# Patient Record
Sex: Female | Born: 1955
Health system: Southern US, Community
[De-identification: ages and names within clinical notes are randomized; demographics above are authoritative.]

## PROBLEM LIST (undated history)

## (undated) DIAGNOSIS — Z9289 Personal history of other medical treatment: Secondary | ICD-10-CM

## (undated) DIAGNOSIS — Z8489 Family history of other specified conditions: Secondary | ICD-10-CM

## (undated) DIAGNOSIS — F32A Depression, unspecified: Secondary | ICD-10-CM

## (undated) DIAGNOSIS — F329 Major depressive disorder, single episode, unspecified: Secondary | ICD-10-CM

## (undated) DIAGNOSIS — R32 Unspecified urinary incontinence: Secondary | ICD-10-CM

## (undated) DIAGNOSIS — R091 Pleurisy: Secondary | ICD-10-CM

## (undated) DIAGNOSIS — C50919 Malignant neoplasm of unspecified site of unspecified female breast: Secondary | ICD-10-CM

## (undated) DIAGNOSIS — R112 Nausea with vomiting, unspecified: Secondary | ICD-10-CM

## (undated) DIAGNOSIS — I10 Essential (primary) hypertension: Secondary | ICD-10-CM

## (undated) DIAGNOSIS — F988 Other specified behavioral and emotional disorders with onset usually occurring in childhood and adolescence: Secondary | ICD-10-CM

## (undated) DIAGNOSIS — K219 Gastro-esophageal reflux disease without esophagitis: Secondary | ICD-10-CM

## (undated) DIAGNOSIS — T4145XA Adverse effect of unspecified anesthetic, initial encounter: Secondary | ICD-10-CM

## (undated) DIAGNOSIS — M1990 Unspecified osteoarthritis, unspecified site: Secondary | ICD-10-CM

## (undated) DIAGNOSIS — J189 Pneumonia, unspecified organism: Secondary | ICD-10-CM

## (undated) DIAGNOSIS — Z8711 Personal history of peptic ulcer disease: Secondary | ICD-10-CM

## (undated) DIAGNOSIS — G43909 Migraine, unspecified, not intractable, without status migrainosus: Secondary | ICD-10-CM

## (undated) DIAGNOSIS — K5900 Constipation, unspecified: Secondary | ICD-10-CM

## (undated) DIAGNOSIS — T8859XA Other complications of anesthesia, initial encounter: Secondary | ICD-10-CM

## (undated) DIAGNOSIS — K5792 Diverticulitis of intestine, part unspecified, without perforation or abscess without bleeding: Secondary | ICD-10-CM

## (undated) DIAGNOSIS — Z9889 Other specified postprocedural states: Secondary | ICD-10-CM

## (undated) DIAGNOSIS — D649 Anemia, unspecified: Secondary | ICD-10-CM

## (undated) DIAGNOSIS — M797 Fibromyalgia: Secondary | ICD-10-CM

## (undated) DIAGNOSIS — Z8719 Personal history of other diseases of the digestive system: Secondary | ICD-10-CM

## (undated) HISTORY — DX: Gastro-esophageal reflux disease without esophagitis: K21.9

## (undated) HISTORY — DX: Personal history of peptic ulcer disease: Z87.11

## (undated) HISTORY — DX: Unspecified urinary incontinence: R32

## (undated) HISTORY — DX: Migraine, unspecified, not intractable, without status migrainosus: G43.909

## (undated) HISTORY — PX: BREAST RECONSTRUCTION: SHX9

## (undated) HISTORY — PX: COLONOSCOPY: SHX174

## (undated) HISTORY — DX: Personal history of other diseases of the digestive system: Z87.19

## (undated) HISTORY — DX: Personal history of other medical treatment: Z92.89

## (undated) HISTORY — DX: Malignant neoplasm of unspecified site of unspecified female breast: C50.919

---
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
## 1983-01-10 HISTORY — PX: MASTECTOMY: SHX3

## 1983-01-10 HISTORY — PX: BREAST BIOPSY: SHX20

## 1985-01-09 HISTORY — PX: CHOLECYSTECTOMY: SHX55

## 1998-02-10 ENCOUNTER — Emergency Department (HOSPITAL_COMMUNITY): Admission: EM | Admit: 1998-02-10 | Discharge: 1998-02-10 | Payer: Self-pay | Admitting: Emergency Medicine

## 1998-02-10 ENCOUNTER — Encounter: Payer: Self-pay | Admitting: Emergency Medicine

## 1998-10-29 ENCOUNTER — Other Ambulatory Visit: Admission: RE | Admit: 1998-10-29 | Discharge: 1998-10-29 | Payer: Self-pay | Admitting: Family Medicine

## 1999-04-12 ENCOUNTER — Ambulatory Visit (HOSPITAL_BASED_OUTPATIENT_CLINIC_OR_DEPARTMENT_OTHER): Admission: RE | Admit: 1999-04-12 | Discharge: 1999-04-12 | Payer: Self-pay | Admitting: General Surgery

## 1999-04-12 ENCOUNTER — Encounter (INDEPENDENT_AMBULATORY_CARE_PROVIDER_SITE_OTHER): Payer: Self-pay | Admitting: *Deleted

## 1999-04-12 ENCOUNTER — Emergency Department (HOSPITAL_COMMUNITY): Admission: EM | Admit: 1999-04-12 | Discharge: 1999-04-12 | Payer: Self-pay | Admitting: Emergency Medicine

## 1999-08-14 ENCOUNTER — Encounter: Payer: Self-pay | Admitting: Emergency Medicine

## 1999-08-14 ENCOUNTER — Emergency Department (HOSPITAL_COMMUNITY): Admission: EM | Admit: 1999-08-14 | Discharge: 1999-08-14 | Payer: Self-pay | Admitting: Emergency Medicine

## 2001-05-27 ENCOUNTER — Ambulatory Visit (HOSPITAL_BASED_OUTPATIENT_CLINIC_OR_DEPARTMENT_OTHER): Admission: RE | Admit: 2001-05-27 | Discharge: 2001-05-27 | Payer: Self-pay | Admitting: Cardiology

## 2009-11-07 ENCOUNTER — Ambulatory Visit: Payer: Self-pay | Admitting: Diagnostic Radiology

## 2009-11-07 ENCOUNTER — Emergency Department (HOSPITAL_BASED_OUTPATIENT_CLINIC_OR_DEPARTMENT_OTHER): Admission: EM | Admit: 2009-11-07 | Discharge: 2009-11-08 | Payer: Self-pay | Admitting: Emergency Medicine

## 2010-01-09 HISTORY — PX: LASIK: SHX215

## 2010-01-09 LAB — HM COLONOSCOPY

## 2010-01-09 LAB — HM MAMMOGRAPHY

## 2010-03-23 LAB — CBC
HCT: 38.7 % (ref 36.0–46.0)
Hemoglobin: 13 g/dL (ref 12.0–15.0)
MCH: 28.2 pg (ref 26.0–34.0)
MCHC: 33.7 g/dL (ref 30.0–36.0)
MCV: 83.6 fL (ref 78.0–100.0)
Platelets: 248 10*3/uL (ref 150–400)
RBC: 4.63 MIL/uL (ref 3.87–5.11)
RDW: 15 % (ref 11.5–15.5)
WBC: 5.3 10*3/uL (ref 4.0–10.5)

## 2010-03-23 LAB — COMPREHENSIVE METABOLIC PANEL
ALT: 34 U/L (ref 0–35)
AST: 26 U/L (ref 0–37)
Albumin: 4.2 g/dL (ref 3.5–5.2)
Alkaline Phosphatase: 102 U/L (ref 39–117)
BUN: 20 mg/dL (ref 6–23)
CO2: 27 mEq/L (ref 19–32)
Calcium: 9 mg/dL (ref 8.4–10.5)
Chloride: 106 mEq/L (ref 96–112)
Creatinine, Ser: 0.9 mg/dL (ref 0.4–1.2)
GFR calc Af Amer: >60 mL/min (ref >60)
GFR calc non Af Amer: >60 mL/min (ref >60)
Glucose, Bld: 118 mg/dL — ABNORMAL HIGH (ref 70–99)
Potassium: 3.4 mEq/L — ABNORMAL LOW (ref 3.5–5.1)
Sodium: 145 mEq/L (ref 135–145)
Total Bilirubin: 0.4 mg/dL (ref 0.3–1.2)
Total Protein: 7.3 g/dL (ref 6.0–8.3)

## 2010-03-23 LAB — DIFFERENTIAL
Basophils Absolute: 0 10*3/uL (ref 0.0–0.1)
Basophils Relative: 1 % (ref 0–1)
Eosinophils Absolute: 0.1 10*3/uL (ref 0.0–0.7)
Eosinophils Relative: 2 % (ref 0–5)
Lymphocytes Relative: 38 % (ref 12–46)
Lymphs Abs: 2 10*3/uL (ref 0.7–4.0)
Monocytes Absolute: 0.4 10*3/uL (ref 0.1–1.0)
Monocytes Relative: 8 % (ref 3–12)
Neutro Abs: 2.8 10*3/uL (ref 1.7–7.7)
Neutrophils Relative %: 51 % (ref 43–77)

## 2010-03-23 LAB — D-DIMER, QUANTITATIVE: D-Dimer, Quant: 0.42 ug/mL-FEU (ref 0.00–0.48)

## 2010-03-23 LAB — POCT CARDIAC MARKERS
CKMB, poc: 1.1 ng/mL (ref 1.0–8.0)
Myoglobin, poc: 72.5 ng/mL (ref 12–200)
Troponin i, poc: <0.05 ng/mL (ref 0.00–0.09)

## 2011-11-08 ENCOUNTER — Encounter (HOSPITAL_COMMUNITY): Payer: Self-pay | Admitting: Emergency Medicine

## 2011-11-08 ENCOUNTER — Emergency Department (HOSPITAL_COMMUNITY): Payer: BC Managed Care – PPO

## 2011-11-08 ENCOUNTER — Observation Stay (HOSPITAL_COMMUNITY)
Admission: EM | Admit: 2011-11-08 | Discharge: 2011-11-09 | Disposition: A | Discharge status: Home / Self Care | Payer: BC Managed Care – PPO | Attending: Emergency Medicine | Admitting: Emergency Medicine

## 2011-11-08 DIAGNOSIS — R079 Chest pain, unspecified: Principal | ICD-10-CM | POA: Insufficient documentation

## 2011-11-08 HISTORY — DX: Major depressive disorder, single episode, unspecified: F32.9

## 2011-11-08 HISTORY — DX: Pleurisy: R09.1

## 2011-11-08 HISTORY — DX: Essential (primary) hypertension: I10

## 2011-11-08 HISTORY — DX: Depression, unspecified: F32.A

## 2011-11-08 LAB — CBC WITH DIFFERENTIAL/PLATELET
Basophils Relative: 1 % (ref 0–1)
Eosinophils Absolute: 0.1 10*3/uL (ref 0.0–0.7)
HCT: 39.3 % (ref 36.0–46.0)
Hemoglobin: 13.1 g/dL (ref 12.0–15.0)
MCH: 27.9 pg (ref 26.0–34.0)
MCHC: 33.3 g/dL (ref 30.0–36.0)
Monocytes Absolute: 0.5 10*3/uL (ref 0.1–1.0)
Monocytes Relative: 8 % (ref 3–12)

## 2011-11-08 LAB — COMPREHENSIVE METABOLIC PANEL
Albumin: 3.7 g/dL (ref 3.5–5.2)
BUN: 17 mg/dL (ref 6–23)
Chloride: 103 mEq/L (ref 96–112)
Creatinine, Ser: 0.77 mg/dL (ref 0.50–1.10)
GFR calc Af Amer: >90 mL/min (ref >90)
Glucose, Bld: 107 mg/dL — ABNORMAL HIGH (ref 70–99)
Total Bilirubin: 0.2 mg/dL — ABNORMAL LOW (ref 0.3–1.2)
Total Protein: 7.1 g/dL (ref 6.0–8.3)

## 2011-11-08 LAB — TROPONIN I
Troponin I: <0.3 ng/mL (ref <0.30)
Troponin I: <0.3 ng/mL (ref <0.30)

## 2011-11-08 LAB — LIPASE, BLOOD: Lipase: 25 U/L (ref 11–59)

## 2011-11-08 MED ORDER — MORPHINE SULFATE 4 MG/ML IJ SOLN
4.0000 mg | Freq: Once | INTRAMUSCULAR | Status: AC
  Administered 2011-11-08: 4 mg via INTRAVENOUS
  Filled 2011-11-08: qty 1

## 2011-11-08 MED ORDER — PROMETHAZINE HCL 25 MG/ML IJ SOLN
25.0000 mg | Freq: Once | INTRAMUSCULAR | Status: AC
  Administered 2011-11-08: 25 mg via INTRAVENOUS
  Filled 2011-11-08: qty 1

## 2011-11-08 MED ORDER — SODIUM CHLORIDE 0.9 % IV BOLUS (SEPSIS)
1000.0000 mL | Freq: Once | INTRAVENOUS | Status: AC
  Administered 2011-11-08: 1000 mL via INTRAVENOUS

## 2011-11-08 NOTE — ED Notes (Signed)
Per EMS: pt has acute pressure in chest 10/10 pain radiating to neck. Pt was pale and daiphoretic on arrival. Pt states she felt like she was going to pass out. EMS states 12 lead was unremarkable en route with few PVCS. BP en route 150/106. Nitro was not given due to possible pt allergy. 6 mg morphine given en route. 2 L [place]. Pt complains of mild headache.

## 2011-11-08 NOTE — ED Notes (Signed)
Pt returned from X-ray.  

## 2011-11-08 NOTE — ED Notes (Signed)
Pt removed from oxygen to see if pt can maintain oxygen saturation without O2

## 2011-11-08 NOTE — ED Provider Notes (Signed)
History     CSN: [id number]  Arrival date & time 11/08/11  1643   First MD Initiated Contact with Patient 11/08/11 1645      Chief Complaint  Patient presents with  . Chest Pain    (Consider location/radiation/quality/duration/timing/severity/associated sxs/prior treatment) HPI Comments: Patient presents with sudden onset of severe chest pain that is substernal and tight/viselike in nature. When EMS arrived they noted that she was pale and clammy. She refused nitroglycerin because her reaction is a severe headache. She responded to 6 mg morphine which brought her pain down from a 10 out of 10 to a 6/10.  Patient is a 56 y.o. female presenting with chest pain. The history is provided by the patient. No language interpreter was used.  Chest Pain The chest pain began 3 - 5 hours ago. Duration of episode(s) is 1 hour. Chest pain occurs rarely. The chest pain is improving. At its most intense, the pain is at 10/10. The pain is currently at 6/10. The severity of the pain is severe. The quality of the pain is described as squeezing, pressure-like and vice-like. The pain radiates to the left jaw. Primary symptoms include shortness of breath and nausea. Pertinent negatives for primary symptoms include no fever, no fatigue, no syncope, no cough, no wheezing, no abdominal pain and no vomiting.  Associated symptoms include diaphoresis and numbness (tingling bilat hands).  Pertinent negatives for associated symptoms include no claudication, no lower extremity edema, no near-syncope and no weakness. She tried nothing for the symptoms. Risk factors include obesity.  Her past medical history is significant for cancer (L breast, s/p mastectomy, remission).  Pertinent negatives for past medical history include no CAD, no MI and no PE.  Her family medical history is significant for CAD in family and early MI in family.     No past medical history on file.  No past surgical history on file.  No family  history on file.  History  Substance Use Topics  . Smoking status: Not on file  . Smokeless tobacco: Not on file  . Alcohol Use: Not on file    OB History    No data available      Review of Systems  Constitutional: Positive for diaphoresis. Negative for fever, chills, activity change, appetite change and fatigue.  HENT: Negative for congestion, rhinorrhea, neck pain, neck stiffness and sinus pressure.   Eyes: Negative for discharge and visual disturbance.  Respiratory: Positive for shortness of breath. Negative for cough, chest tightness, wheezing and stridor.   Cardiovascular: Positive for chest pain. Negative for claudication, leg swelling, syncope and near-syncope.  Gastrointestinal: Positive for nausea. Negative for vomiting, abdominal pain, diarrhea and abdominal distention.  Genitourinary: Negative for decreased urine volume and difficulty urinating.  Musculoskeletal: Negative for back pain and arthralgias.  Skin: Positive for pallor. Negative for color change.  Neurological: Positive for numbness (tingling bilat hands). Negative for weakness, light-headedness and headaches.  Psychiatric/Behavioral: Negative for behavioral problems and agitation.  All other systems reviewed and are negative.    Allergies  Nitroglycerin  Home Medications  No current outpatient prescriptions on file.  BP 167/97  Pulse 79  Temp 97.9 F (36.6 C) (Oral)  Resp 19  SpO2 100%  Physical Exam  Nursing note and vitals reviewed. Constitutional: She is oriented to person, place, and time. She appears well-developed and well-nourished. No distress.  HENT:  Head: Normocephalic and atraumatic.  Mouth/Throat: No oropharyngeal exudate.  Eyes: EOM are normal. Pupils are equal, round, and  reactive to light. Right eye exhibits no discharge. Left eye exhibits no discharge.  Neck: Normal range of motion. Neck supple. No JVD present.  Cardiovascular: Normal rate, regular rhythm and normal heart  sounds.  Exam reveals no gallop and no friction rub.   No murmur heard. Pulmonary/Chest: Effort normal and breath sounds normal. No stridor. No respiratory distress. She exhibits no tenderness.  Abdominal: Soft. Bowel sounds are normal. She exhibits no distension. There is tenderness (mild upper abd, states it feels "sore"). There is no guarding.  Musculoskeletal: Normal range of motion. She exhibits no edema and no tenderness.  Neurological: She is alert and oriented to person, place, and time. No cranial nerve deficit. She exhibits normal muscle tone.  Skin: Skin is warm and dry. No rash noted. She is not diaphoretic.  Psychiatric: She has a normal mood and affect. Her behavior is normal. Judgment and thought content normal.    ED Course  Procedures (including critical care time)  Labs Reviewed  COMPREHENSIVE METABOLIC PANEL - Abnormal; Notable for the following:    Glucose, Bld 107 (*)     Total Bilirubin 0.2 (*)     All other components within normal limits  CBC WITH DIFFERENTIAL  LIPASE, BLOOD  TROPONIN I  TROPONIN I  TROPONIN I   Dg Chest 2 View  11/08/2011  *RADIOLOGY REPORT*  Clinical Data: Chest pain.  Short of breath.  CHEST - 2 VIEW  Comparison: 11/07/2009  Findings: Artifact overlies chest.  Heart size is normal. Mediastinal shadows are normal.  The lungs are clear.  No effusions.  No significant bony finding.  IMPRESSION: No active disease   Original Report Authenticated By: Thomasenia Sales, M.D.      1. Chest pain at rest     EKG interpreted by me. NSR, no acute ST or TW changes indicating acute ischemia or infarction. No acute arrhythmias.  MDM    5:12 PM will give 4 mg of morphine. Normal EKG. History concerning for ACS. I considered but doubt pulmonary embolism, aortic dissection, pneumonia, pneumothorax as the cause of the patient's chest pain.  First troponin was negative. Chest pain resolved. Will keep her in our observation unit for a low risk chest pain  rule out. Will cycle troponins and further evaluate with coronary CT in the morning.        Warrick Parisian, MD 11/09/11 0111

## 2011-11-08 NOTE — ED Provider Notes (Signed)
56 year old female moved to CDU and chest pain protocol. Currently she is not having any chest pain. States she is beginning to get a "migraine"which is causing her to become nauseated. She has not eaten anything all day and is hungry. Denies shortness of breath. On exam patient is resting comfortably in bed in no apparent distress. Eyes PERRLA, EOMI and conjunctiva normal. Heart RRR. Lungs CTA. Lower extremities without edema. Distal pulses intact. AAOx3. Initial troponin and labs negative. Awaiting CTA in the morning.  11:55 PM Case discussed with Dr. Hyacinth Meeker who will take over care of patient at this time.  Trevor Mace, PA-C 11/08/11 2355

## 2011-11-08 NOTE — ED Notes (Signed)
Family at bedside. 

## 2011-11-08 NOTE — ED Notes (Signed)
Report given to Damon, RN in CDU. Pt trasnfered to CDU.

## 2011-11-08 NOTE — ED Notes (Signed)
Pt denies nausea, dizziness. Pt states possible lightheadedness from medicine. Pt states pressure in head.

## 2011-11-08 NOTE — ED Notes (Signed)
Ambrose Mantle, MD at bedside.

## 2011-11-08 NOTE — ED Notes (Signed)
Pt states chest pain started and was 10/10 pain. Pt states she became short of breath and hands felt like "pins and needles"

## 2011-11-09 ENCOUNTER — Observation Stay (HOSPITAL_COMMUNITY): Payer: BC Managed Care – PPO

## 2011-11-09 MED ORDER — ACETAMINOPHEN 325 MG PO TABS: 650.0000 mg | ORAL_TABLET | Freq: Once | ORAL | Status: AC

## 2011-11-09 MED ORDER — METOPROLOL TARTRATE 25 MG PO TABS
100.0000 mg | ORAL_TABLET | Freq: Once | ORAL | Status: AC
  Administered 2011-11-09: 100 mg via ORAL
  Filled 2011-11-09: qty 4

## 2011-11-09 MED ORDER — MORPHINE SULFATE 4 MG/ML IJ SOLN
4.0000 mg | Freq: Once | INTRAMUSCULAR | Status: AC
  Administered 2011-11-09: 4 mg via INTRAVENOUS
  Filled 2011-11-09: qty 1

## 2011-11-09 MED ORDER — METOPROLOL TARTRATE 1 MG/ML IV SOLN
INTRAVENOUS | Status: AC
  Administered 2011-11-09: 5 mg
  Filled 2011-11-09: qty 15

## 2011-11-09 MED ORDER — IOHEXOL 350 MG/ML SOLN
80.0000 mL | Freq: Once | INTRAVENOUS | Status: AC | PRN
  Administered 2011-11-09: 80 mL via INTRAVENOUS

## 2011-11-09 NOTE — ED Provider Notes (Signed)
Medical screening examination/treatment/procedure(s) were performed by non-physician practitioner and as supervising physician I was immediately available for consultation/collaboration.   Lyanne Co, MD 11/09/11 [phone]

## 2011-11-09 NOTE — Progress Notes (Signed)
Utilization review completed.  

## 2011-11-09 NOTE — ED Provider Notes (Signed)
I saw and evaluated the patient, reviewed the resident's note and I agree with the findings and plan.   Date: 11/09/2011  Rate: 76  Rhythm: normal sinus rhythm  QRS Axis: normal  Intervals: normal  ST/T Wave abnormalities: normal  Conduction Disutrbances: none  Narrative Interpretation:   Old EKG Reviewed: No significant changes noted  Lower is chest pain.  Up with the patient is a good candidate for our CT chest pain protocol.  The patient will receive serial cardiac enzymes through the night and likely cardiac CT in the morning.  1. Chest pain at rest    Dg Chest 2 View  11/08/2011  *RADIOLOGY REPORT*  Clinical Data: Chest pain.  Short of breath.  CHEST - 2 VIEW  Comparison: 11/07/2009  Findings: Artifact overlies chest.  Heart size is normal. Mediastinal shadows are normal.  The lungs are clear.  No effusions.  No significant bony finding.  IMPRESSION: No active disease   Original Report Authenticated By: Thomasenia Sales, M.D.   Results for orders placed during the hospital encounter of 11/08/11  CBC WITH DIFFERENTIAL      Component Value Range   WBC 6.8  4.0 - 10.5 K/uL   RBC 4.69  3.87 - 5.11 MIL/uL   Hemoglobin 13.1  12.0 - 15.0 g/dL   HCT 84.6  96.2 - 95.2 %   MCV 83.8  78.0 - 100.0 fL   MCH 27.9  26.0 - 34.0 pg   MCHC 33.3  30.0 - 36.0 g/dL   RDW 84.1  32.4 - 40.1 %   Platelets 226  150 - 400 K/uL   Neutrophils Relative 61  43 - 77 %   Neutro Abs 4.1  1.7 - 7.7 K/uL   Lymphocytes Relative 29  12 - 46 %   Lymphs Abs 2.0  0.7 - 4.0 K/uL   Monocytes Relative 8  3 - 12 %   Monocytes Absolute 0.5  0.1 - 1.0 K/uL   Eosinophils Relative 2  0 - 5 %   Eosinophils Absolute 0.1  0.0 - 0.7 K/uL   Basophils Relative 1  0 - 1 %   Basophils Absolute 0.0  0.0 - 0.1 K/uL  COMPREHENSIVE METABOLIC PANEL      Component Value Range   Sodium 139  135 - 145 mEq/L   Potassium 3.9  3.5 - 5.1 mEq/L   Chloride 103  96 - 112 mEq/L   CO2 26  19 - 32 mEq/L   Glucose, Bld 107 (*) 70 - 99 mg/dL    BUN 17  6 - 23 mg/dL   Creatinine, Ser 0.27  0.50 - 1.10 mg/dL   Calcium 9.7  8.4 - 25.3 mg/dL   Total Protein 7.1  6.0 - 8.3 g/dL   Albumin 3.7  3.5 - 5.2 g/dL   AST 16  0 - 37 U/L   ALT 15  0 - 35 U/L   Alkaline Phosphatase 60  39 - 117 U/L   Total Bilirubin 0.2 (*) 0.3 - 1.2 mg/dL   GFR calc non Af Amer >90  >90 mL/min   GFR calc Af Amer >90  >90 mL/min  LIPASE, BLOOD      Component Value Range   Lipase 25  11 - 59 U/L  TROPONIN I      Component Value Range   Troponin I <0.30  <0.30 ng/mL  TROPONIN I      Component Value Range   Troponin I <0.30  <0.30  ng/mL  TROPONIN I      Component Value Range   Troponin I <0.30  <0.30 ng/mL        Lyanne Co, MD 11/09/11 [phone]

## 2011-11-09 NOTE — ED Provider Notes (Signed)
Patient in CDU on CPP for CTA. Results show negative for CAD, calc score 0. Patient has not had further chest pain. She reports she feels better and is ready for discharge. Encourage follow up with Dr. Sharol Roussel for further outpatient evaluation for cause of chest pain.   Rodena Medin, PA-C 11/09/11 1132

## 2011-11-09 NOTE — ED Notes (Addendum)
Pt O2 sats were dropping in the 70s x2. Pt awaken from sleep. Pt denies SOB or chest pain. Oxygen placed on patient with [place] on 2L. Pt sats 98% on 2 L with Nasal cannula. Pt stable. RN notified of change.

## 2011-11-09 NOTE — ED Notes (Signed)
Pt ambulated to bathroom. HR 82

## 2011-11-11 NOTE — ED Provider Notes (Signed)
Medical screening examination/treatment/procedure(s) were performed by non-physician practitioner and as supervising physician I was immediately available for consultation/collaboration.    Hayslee Casebolt D Hilma Steinhilber, MD 11/11/11 0609 

## 2012-12-26 ENCOUNTER — Telehealth: Payer: Self-pay

## 2012-12-26 NOTE — Telephone Encounter (Addendum)
Unable to reach pre visit Cell # is [employer]

## 2012-12-27 ENCOUNTER — Encounter: Payer: Self-pay | Admitting: Family Medicine

## 2012-12-27 ENCOUNTER — Ambulatory Visit (INDEPENDENT_AMBULATORY_CARE_PROVIDER_SITE_OTHER): Payer: BC Managed Care – PPO | Admitting: Family Medicine

## 2012-12-27 VITALS — BP 118/72 | HR 87 | Temp 98.1°F | Ht 65.0 in | Wt 210.0 lb

## 2012-12-27 DIAGNOSIS — J302 Other seasonal allergic rhinitis: Secondary | ICD-10-CM

## 2012-12-27 DIAGNOSIS — Z853 Personal history of malignant neoplasm of breast: Secondary | ICD-10-CM

## 2012-12-27 DIAGNOSIS — IMO0001 Reserved for inherently not codable concepts without codable children: Secondary | ICD-10-CM

## 2012-12-27 DIAGNOSIS — J309 Allergic rhinitis, unspecified: Secondary | ICD-10-CM

## 2012-12-27 DIAGNOSIS — E669 Obesity, unspecified: Secondary | ICD-10-CM | POA: Insufficient documentation

## 2012-12-27 DIAGNOSIS — R51 Headache: Secondary | ICD-10-CM

## 2012-12-27 DIAGNOSIS — F329 Major depressive disorder, single episode, unspecified: Secondary | ICD-10-CM

## 2012-12-27 DIAGNOSIS — R0609 Other forms of dyspnea: Secondary | ICD-10-CM

## 2012-12-27 DIAGNOSIS — I10 Essential (primary) hypertension: Secondary | ICD-10-CM

## 2012-12-27 DIAGNOSIS — F341 Dysthymic disorder: Secondary | ICD-10-CM

## 2012-12-27 DIAGNOSIS — F418 Other specified anxiety disorders: Secondary | ICD-10-CM | POA: Insufficient documentation

## 2012-12-27 DIAGNOSIS — K219 Gastro-esophageal reflux disease without esophagitis: Secondary | ICD-10-CM | POA: Insufficient documentation

## 2012-12-27 DIAGNOSIS — R0683 Snoring: Secondary | ICD-10-CM

## 2012-12-27 DIAGNOSIS — M797 Fibromyalgia: Secondary | ICD-10-CM

## 2012-12-27 DIAGNOSIS — G43109 Migraine with aura, not intractable, without status migrainosus: Secondary | ICD-10-CM

## 2012-12-27 MED ORDER — MELOXICAM 15 MG PO TABS: ORAL_TABLET | ORAL | Status: DC

## 2012-12-27 MED ORDER — BUPROPION HCL ER (XL) 300 MG PO TB24: 300.0000 mg | ORAL_TABLET | Freq: Every day | ORAL | Status: DC

## 2012-12-27 MED ORDER — CYCLOBENZAPRINE HCL 10 MG PO TABS: 10.0000 mg | ORAL_TABLET | Freq: Three times a day (TID) | ORAL | Status: DC | PRN

## 2012-12-27 MED ORDER — LORATADINE 10 MG PO TABS: 10.0000 mg | ORAL_TABLET | Freq: Every day | ORAL | Status: DC

## 2012-12-27 MED ORDER — MONTELUKAST SODIUM 10 MG PO TABS: 10.0000 mg | ORAL_TABLET | Freq: Every day | ORAL | Status: DC

## 2012-12-27 MED ORDER — FLUOXETINE HCL 10 MG PO CAPS: 10.0000 mg | ORAL_CAPSULE | Freq: Every day | ORAL | Status: DC

## 2012-12-27 MED ORDER — FLUTICASONE PROPIONATE 50 MCG/ACT NA SUSP: 2.0000 | Freq: Every day | NASAL | Status: DC

## 2012-12-27 NOTE — Assessment & Plan Note (Signed)
con't nasal steroid  con't antihistamine

## 2012-12-27 NOTE — Assessment & Plan Note (Signed)
Check labs Refill meds  

## 2012-12-27 NOTE — Progress Notes (Signed)
   Subjective:    Patient ID: Theresa [name], female    DOB: [birth year], 57 y.o.   MRN: [id number]  HPI Pt here to establish --- she c/o seasonal allergies and stress which causes headaches.  She is taking an antihistamine for allergies.   Pt also being treated for depression/ anxiety but says she feels the meds dec libido and make her care about nothing.      Review of Systems    as above Objective:   Physical Exam BP 118/72  Pulse 87  Temp(Src) 98.1 F (36.7 C) (Oral)  Ht 5\' 5"  (1.651 m)  Wt 210 lb (95.255 kg)  BMI 34.95 kg/m2  SpO2 98% General appearance: alert, cooperative, appears stated age and no distress Throat: lips, mucosa, and tongue normal; teeth and gums normal Neck: no adenopathy, no carotid bruit, no JVD, supple, symmetrical, trachea midline and thyroid not enlarged, symmetric, no tenderness/mass/nodules Lungs: clear to auscultation bilaterally Heart: S1, S2 normal Extremities: extremities normal, atraumatic, no cyanosis or edema Psych-- no anxiety, no depression       Assessment & Plan:

## 2012-12-27 NOTE — Assessment & Plan Note (Signed)
prozac 10 mg con't wellbutrin xl 300 mg rto 1 month

## 2012-12-27 NOTE — Assessment & Plan Note (Signed)
Increased toprol to 200 mg daily rto 2-3 weeks

## 2012-12-27 NOTE — Progress Notes (Signed)
Pre visit review using our clinic review tool, if applicable. No additional management support is needed unless otherwise documented below in the visit note. 

## 2012-12-27 NOTE — Patient Instructions (Signed)

## 2012-12-27 NOTE — Assessment & Plan Note (Signed)
Stable con't meds 

## 2013-01-30 ENCOUNTER — Telehealth: Payer: Self-pay

## 2013-01-30 NOTE — Telephone Encounter (Signed)
Call from patient and she stated she is not feeling sick, however her ears feel full. Dr.Lowne has her on Singulair and Claritin and she wanted to know if there was something else she should do. I advised the patient she can try Claritin D for 4-5 days and if no relief we may need to check the ears to be sure they are not clogged. Patient agreed to do so and will call back in a few days if no relief.     KP

## 2013-02-13 ENCOUNTER — Institutional Professional Consult (permissible substitution): Payer: BC Managed Care – PPO | Admitting: Pulmonary Disease

## 2013-02-17 ENCOUNTER — Ambulatory Visit (INDEPENDENT_AMBULATORY_CARE_PROVIDER_SITE_OTHER): Payer: BC Managed Care – PPO | Admitting: Pulmonary Disease

## 2013-02-17 ENCOUNTER — Encounter (INDEPENDENT_AMBULATORY_CARE_PROVIDER_SITE_OTHER): Payer: Self-pay

## 2013-02-17 ENCOUNTER — Encounter: Payer: Self-pay | Admitting: Pulmonary Disease

## 2013-02-17 VITALS — BP 128/92 | HR 75 | Temp 98.0°F | Ht 65.0 in | Wt 211.6 lb

## 2013-02-17 DIAGNOSIS — G4733 Obstructive sleep apnea (adult) (pediatric): Secondary | ICD-10-CM | POA: Insufficient documentation

## 2013-02-17 NOTE — Patient Instructions (Signed)
Will schedule for home sleep testing if ok with your insurance.  If not, will schedule at sleep center Work on weight reduction.

## 2013-02-17 NOTE — Progress Notes (Signed)
Subjective:    Patient ID: Theresa [name], female    DOB: 1955-11-01, 58 y.o.   MRN: [id number]  HPI The patient is a 58 year old female who I've been asked to see for possible obstructive sleep apnea. She tells me that she had a sleep study in 2003, and was told that she did not have sleep apnea. More recently, she has been noted to have loud snoring, as well as gasping and apneic episode by her bed partner.  She has very frequent awakenings at night, and is never rested in the mornings upon arising. She notes significant inappropriate daytime sleepiness with inactivity, and will even occasionally take naps in the afternoon. She also has sleepiness in the evenings trying to watch television, and intermittent sleepiness with driving. The patient states that her weight is been neutral over the last 2 years, and her Epworth score today is 16.   Sleep Questionnaire What time [name] you typically go to bed?( Between what hours) 8p 8p at 1655 on 02/17/13 by Virl Cagey, CMA How long does it take you to fall asleep? 68mins 18mins at 1655 on 02/17/13 by Virl Cagey, CMA How many times during the night [name] you wake up? 6 6 at 1655 on 02/17/13 by Virl Cagey, CMA What time [name] you get out of bed to start your day? 0530 0530 at 1655 on 02/17/13 by Virl Cagey, CMA [name] you drive or operate heavy machinery in your occupation? No No at 1655 on 02/17/13 by Virl Cagey, CMA How much has your weight changed (up or down) over the past two years? (In pounds) 10 lb (4.536 kg)10 lb (4.536 kg) +/- at 1655 on 02/17/13 by Virl Cagey, CMA Have you ever had a sleep study before? Yes Yes at 1655 on 02/17/13 by Virl Cagey, CMA If yes, location of study? [facility] at 1655 on 02/17/13 by Virl Cagey, CMA If yes, date of study? 2003? 2003? at 1655 on 02/17/13 by Virl Cagey, CMA [name] you currently use CPAP? No No at 1655 on 02/17/13 by Virl Cagey, CMA [name] you wear oxygen at any  time? No No at 1655 on 02/17/13 by Virl Cagey, CMA   Review of Systems  Constitutional: Negative for fever and unexpected weight change.  HENT: Positive for congestion, dental problem, sore throat and trouble swallowing. Negative for ear pain, nosebleeds, postnasal drip, rhinorrhea, sinus pressure and sneezing.   Eyes: Negative for redness and itching.  Respiratory: Negative for cough, chest tightness, shortness of breath and wheezing.   Cardiovascular: Negative for palpitations and leg swelling.  Gastrointestinal: Negative for nausea and vomiting.       Acid heartburn  Genitourinary: Negative for dysuria.  Musculoskeletal: Negative for joint swelling.  Skin: Negative for rash.  Neurological: Positive for headaches.  Hematological: Does not bruise/bleed easily.  Psychiatric/Behavioral: Negative for dysphoric mood. The patient is not nervous/anxious.        Objective:   Physical Exam Constitutional:  Obese female , no acute distress  HENT:  Nares patent without discharge  Oropharynx without exudate, palate and uvula are difficult to assess due to large tongue.  Eyes:  Perrla, eomi, no scleral icterus  Neck:  No JVD, no TMG  Cardiovascular:  Normal rate, regular rhythm, no rubs or gallops.  No murmurs        Intact distal pulses  Pulmonary :  Normal breath sounds, no stridor or respiratory distress   No  rales, rhonchi, or wheezing  Abdominal:  Soft, nondistended, bowel sounds present.  No tenderness noted.   Musculoskeletal:  No lower extremity edema noted.  Lymph Nodes:  No cervical lymphadenopathy noted  Skin:  No cyanosis noted  Neurologic:  Alert but appears tired, appropriate, moves all 4 extremities without obvious deficit.         Assessment & Plan:

## 2013-02-17 NOTE — Assessment & Plan Note (Signed)
The patient's history is very suggestive of clinically significant sleep apnea. I have had a long discussion with her about the pathophysiology of sleep apnea, including its impact to her quality of life and cardiovascular health.  I think she needs to have a sleep study for diagnosis, and that she is an excellent candidate for home sleep testing if allowed by her insurance. The patient is agreeable to this approach.

## 2013-03-26 ENCOUNTER — Encounter: Payer: Self-pay | Admitting: Family Medicine

## 2013-04-04 ENCOUNTER — Encounter: Payer: BC Managed Care – PPO | Admitting: Family Medicine

## 2013-04-24 ENCOUNTER — Ambulatory Visit: Payer: Self-pay

## 2013-04-24 ENCOUNTER — Telehealth: Payer: Self-pay | Admitting: Family Medicine

## 2013-04-24 DIAGNOSIS — Z853 Personal history of malignant neoplasm of breast: Secondary | ICD-10-CM

## 2013-04-24 DIAGNOSIS — Z1231 Encounter for screening mammogram for malignant neoplasm of breast: Secondary | ICD-10-CM

## 2013-04-24 NOTE — Addendum Note (Signed)
Addended by: Ewing Schlein on: 04/24/2013 12:10 PM   Modules accepted: Orders

## 2013-04-24 NOTE — Telephone Encounter (Signed)
Spoke with patient and  She has a history of Breast Cancer but has not had a mammogram in a long time, I called the breast center and they made me aware the patient would need a copy of her films, last mammogram, and recommendation from previous provider. I made the patient aware and she could not remember who had done it, but she will find out and get back to me, We were unable to schedule at this time due to lack of info.     KP

## 2013-04-24 NOTE — Telephone Encounter (Signed)
4.16.15  Pt has an apt on 5.26.15  @10 :30 for CPE and is wondering if she can have a referral to have a mammogram done on the same day due to work restrictions.  Please contact pt to advise.

## 2013-04-24 NOTE — Telephone Encounter (Signed)
Call from Theresa [name] at [facility] and the patient had her last mammogram there in 2009. She said we can fax and order to [phone]. Called the patient on another line and she agreed to have her mammogram done there, I went ahead and scheduled the apt and will fax the order.       KP

## 2013-05-30 ENCOUNTER — Telehealth: Payer: Self-pay

## 2013-05-30 NOTE — Telephone Encounter (Signed)
Husband answered the phone.  Pt was not available at this time.  Left message with husband for call back.

## 2013-05-30 NOTE — Telephone Encounter (Signed)
Medication and allergies:  Reviewed and updated  90 day supply/mail order: n/a Local pharmacy:  [id number] - [place], [place] - [address] AT [address]   Immunizations due:  Tdap-DUE-patient declined   A/P: No changes to personal, family history or past surgical hx PAP- "very long time ago"---stated that she would like PAP with this appointment [facility]- 2012 [facility]- appointment scheduled Flu- did not receive Tdap-declined   To Discuss with Provider: Patient would like a refill on her Cyclobenzaprine (flexril).  It appears that this medication has been discontinued. She would also like a refill on her Meloxicam 15 mg 1/2- 1 by mouth every day as needed.    Last OV:  12/27/12 Pt has enough pills to last her until her office visit on Tuesday.

## 2013-06-03 ENCOUNTER — Encounter: Payer: BC Managed Care – PPO | Admitting: Family Medicine

## 2013-06-03 ENCOUNTER — Ambulatory Visit (INDEPENDENT_AMBULATORY_CARE_PROVIDER_SITE_OTHER): Payer: BC Managed Care – PPO | Admitting: Family Medicine

## 2013-06-03 ENCOUNTER — Encounter: Payer: Self-pay | Admitting: Family Medicine

## 2013-06-03 ENCOUNTER — Other Ambulatory Visit (HOSPITAL_COMMUNITY)
Admission: RE | Admit: 2013-06-03 | Discharge: 2013-06-03 | Disposition: A | Discharge status: Home / Self Care | Payer: BC Managed Care – PPO | Source: Ambulatory Visit | Attending: Family Medicine | Admitting: Family Medicine

## 2013-06-03 VITALS — BP 126/82 | HR 88 | Temp 99.0°F | Ht 65.0 in | Wt 220.0 lb

## 2013-06-03 DIAGNOSIS — Z0000 Encounter for general adult medical examination without abnormal findings: Secondary | ICD-10-CM

## 2013-06-03 DIAGNOSIS — F411 Generalized anxiety disorder: Secondary | ICD-10-CM

## 2013-06-03 DIAGNOSIS — Z01419 Encounter for gynecological examination (general) (routine) without abnormal findings: Secondary | ICD-10-CM | POA: Insufficient documentation

## 2013-06-03 DIAGNOSIS — R51 Headache: Secondary | ICD-10-CM

## 2013-06-03 DIAGNOSIS — M797 Fibromyalgia: Secondary | ICD-10-CM

## 2013-06-03 DIAGNOSIS — IMO0001 Reserved for inherently not codable concepts without codable children: Secondary | ICD-10-CM

## 2013-06-03 DIAGNOSIS — Z1151 Encounter for screening for human papillomavirus (HPV): Secondary | ICD-10-CM | POA: Insufficient documentation

## 2013-06-03 DIAGNOSIS — F341 Dysthymic disorder: Secondary | ICD-10-CM

## 2013-06-03 DIAGNOSIS — F988 Other specified behavioral and emotional disorders with onset usually occurring in childhood and adolescence: Secondary | ICD-10-CM

## 2013-06-03 DIAGNOSIS — F418 Other specified anxiety disorders: Secondary | ICD-10-CM

## 2013-06-03 DIAGNOSIS — R519 Headache, unspecified: Secondary | ICD-10-CM

## 2013-06-03 DIAGNOSIS — Z124 Encounter for screening for malignant neoplasm of cervix: Secondary | ICD-10-CM

## 2013-06-03 LAB — CBC WITH DIFFERENTIAL/PLATELET
BASOS ABS: 0 10*3/uL (ref 0.0–0.1)
Basophils Relative: 0.5 % (ref 0.0–3.0)
EOS PCT: 3.4 % (ref 0.0–5.0)
Eosinophils Absolute: 0.2 10*3/uL (ref 0.0–0.7)
HEMATOCRIT: 41 % (ref 36.0–46.0)
HEMOGLOBIN: 13.5 g/dL (ref 12.0–15.0)
LYMPHS ABS: 1.9 10*3/uL (ref 0.7–4.0)
Lymphocytes Relative: 36.9 % (ref 12.0–46.0)
MCHC: 32.8 g/dL (ref 30.0–36.0)
MCV: 84.1 fl (ref 78.0–100.0)
MONOS PCT: 6.9 % (ref 3.0–12.0)
Monocytes Absolute: 0.4 10*3/uL (ref 0.1–1.0)
NEUTROS ABS: 2.7 10*3/uL (ref 1.4–7.7)
Neutrophils Relative %: 52.3 % (ref 43.0–77.0)
Platelets: 255 10*3/uL (ref 150.0–400.0)
RBC: 4.88 Mil/uL (ref 3.87–5.11)
RDW: 16.3 % — AB (ref 11.5–15.5)
WBC: 5.3 10*3/uL (ref 4.0–10.5)

## 2013-06-03 LAB — BASIC METABOLIC PANEL
BUN: 19 mg/dL (ref 6–23)
CALCIUM: 9.5 mg/dL (ref 8.4–10.5)
CO2: 26 mEq/L (ref 19–32)
Chloride: 105 mEq/L (ref 96–112)
Creatinine, Ser: 0.8 mg/dL (ref 0.4–1.2)
GFR: 77.99 mL/min (ref >60.00)
Glucose, Bld: 94 mg/dL (ref 70–99)
Potassium: 4.2 mEq/L (ref 3.5–5.1)
SODIUM: 141 meq/L (ref 135–145)

## 2013-06-03 LAB — POCT URINALYSIS DIPSTICK
Bilirubin, UA: NEGATIVE
Blood, UA: NEGATIVE
Glucose, UA: NEGATIVE
Ketones, UA: NEGATIVE
LEUKOCYTES UA: NEGATIVE
Nitrite, UA: NEGATIVE
PROTEIN UA: NEGATIVE
Spec Grav, UA: 1.015
UROBILINOGEN UA: 0.2
pH, UA: 6.5

## 2013-06-03 LAB — HEPATIC FUNCTION PANEL
ALBUMIN: 4 g/dL (ref 3.5–5.2)
ALT: 19 U/L (ref 0–35)
AST: 19 U/L (ref 0–37)
Alkaline Phosphatase: 44 U/L (ref 39–117)
Bilirubin, Direct: 0 mg/dL (ref 0.0–0.3)
TOTAL PROTEIN: 7.2 g/dL (ref 6.0–8.3)
Total Bilirubin: 0.4 mg/dL (ref 0.2–1.2)

## 2013-06-03 LAB — HM MAMMOGRAPHY

## 2013-06-03 LAB — TSH: TSH: 0.65 u[IU]/mL (ref 0.35–4.50)

## 2013-06-03 MED ORDER — CYCLOBENZAPRINE HCL 10 MG PO TABS: 10.0000 mg | ORAL_TABLET | ORAL | Status: DC | PRN

## 2013-06-03 MED ORDER — FLUOXETINE HCL 10 MG PO CAPS: 10.0000 mg | ORAL_CAPSULE | Freq: Every day | ORAL | Status: DC

## 2013-06-03 MED ORDER — BUPROPION HCL ER (XL) 300 MG PO TB24: 300.0000 mg | ORAL_TABLET | Freq: Every day | ORAL | Status: DC

## 2013-06-03 MED ORDER — MELOXICAM 15 MG PO TABS: ORAL_TABLET | ORAL | Status: DC

## 2013-06-03 NOTE — Addendum Note (Signed)
Addended by: Ewing Schlein on: 06/03/2013 05:21 PM   Modules accepted: Orders

## 2013-06-03 NOTE — Patient Instructions (Signed)

## 2013-06-03 NOTE — Progress Notes (Signed)
Pre visit review using our clinic review tool, if applicable. No additional management support is needed unless otherwise documented below in the visit note. 

## 2013-06-03 NOTE — Progress Notes (Signed)
Subjective:     Theresa [name] is a 58 y.o. female and is here for a comprehensive physical exam. The patient reports no problems.  History   Social History  . Marital Status: Married    Spouse Name: N/A    Number of Children: N/A  . Years of Education: N/A   Occupational History  .    Social History Main Topics  . Smoking status: Never Smoker   . Smokeless tobacco: Not on file  . Alcohol Use: Yes     Comment: occasionally  . Drug Use: No  . Sexual Activity: Not on file   Other Topics Concern  . Not on file   Social History Narrative  . No narrative on file   Health Maintenance  Topic Date Due  . Pap Smear  01/09/2013  . Tetanus/tdap  09/30/2013 (Originally 04/04/1974)  . Influenza Vaccine  08/09/2013  . Mammogram  06/04/2015  . Colonoscopy  01/10/2020    The following portions of the patient's history were reviewed and updated as appropriate:  She  has a past medical history of Hypertension; Breast cancer; Pleurisy; Depression; GERD (gastroesophageal reflux disease); History of stomach ulcers; Migraines; Urinary incontinence; and History of blood transfusion. She  does not have any pertinent problems on file. She  has past surgical history that includes Cholecystectomy (1987); Appendectomy (1960); Breast biopsy (Left, 1985); Mastectomy (1985); Breast reconstruction (1986 and 2010); and LASIK (2012). Her family history includes Arthritis in her maternal grandmother; Breast cancer in her mother; Depression in her father and mother; Heart failure in her father. She  reports that she has never smoked. She does not have any smokeless tobacco history on file. She reports that she drinks alcohol. She reports that she does not use illicit drugs. She has a current medication list which includes the following prescription(s): bupropion, cyclobenzaprine, fluticasone, loratadine, meloxicam, montelukast, and fluoxetine. Current Outpatient  Prescriptions on File Prior to Visit  Medication Sig Dispense Refill  . fluticasone (FLONASE) 50 MCG/ACT nasal spray Place 2 sprays into both nostrils daily as needed.      . loratadine (CLARITIN) 10 MG tablet Take 10 mg by mouth daily as needed.      . montelukast (SINGULAIR) 10 MG tablet Take 10 mg by mouth at bedtime as needed.       No current facility-administered medications on file prior to visit.   She is allergic to nitroglycerin..  Review of Systems Review of Systems  Constitutional: Negative for activity change, appetite change and fatigue.  HENT: Negative for hearing loss, congestion, tinnitus and ear discharge.  dentist q78m Eyes: Negative for visual disturbance (see optho q2y -- vision corrected to 20/20 with glasses).  Respiratory: Negative for cough, chest tightness and shortness of breath.   Cardiovascular: Negative for chest pain, palpitations and leg swelling.  Gastrointestinal: Negative for abdominal pain, diarrhea, constipation and abdominal distention.  Genitourinary: Negative for urgency, frequency, decreased urine volume and difficulty urinating.  Musculoskeletal: Negative for back pain, arthralgias and gait problem.  Skin: Negative for color change, pallor and rash.  Neurological: Negative for dizziness, light-headedness, numbness and headaches.  Hematological: Negative for adenopathy. Does not bruise/bleed easily.  Psychiatric/Behavioral: Negative for suicidal ideas, confusion, sleep disturbance, self-injury, dysphoric mood, decreased concentration and agitation.       Objective:    BP 126/82  Pulse 88  Temp(Src) 99 F (37.2 C) (Oral)  Ht 5\' 5"  (1.651 m)  Wt 220 lb (99.791 kg)  BMI 36.61  kg/m2  SpO2 95% General appearance: alert, cooperative, appears stated age and no distress Head: Normocephalic, without obvious abnormality, atraumatic Eyes: conjunctivae/corneas clear. PERRL, EOM's intact. Fundi benign. Ears: normal TM's and external ear canals both  ears Nose: Nares normal. Septum midline. Mucosa normal. No drainage or sinus tenderness. Throat: lips, mucosa, and tongue normal; teeth and gums normal Neck: no adenopathy, no carotid bruit, no JVD, supple, symmetrical, trachea midline and thyroid not enlarged, symmetric, no tenderness/mass/nodules Back: symmetric, no curvature. ROM normal. No CVA tenderness. Lungs: clear to auscultation bilaterally Breasts: normal appearance, no masses or tenderness Heart: regular rate and rhythm, S1, S2 normal, no murmur, click, rub or gallop Abdomen: soft, non-tender; bowel sounds normal; no masses,  no organomegaly Pelvic: cervix normal in appearance, external genitalia normal, no adnexal masses or tenderness, no cervical motion tenderness, rectovaginal septum normal, uterus normal size, shape, and consistency, vagina normal without discharge and pap done Extremities: extremities normal, atraumatic, no cyanosis or edema Pulses: 2+ and symmetric Skin: Skin color, texture, turgor normal. No rashes or lesions Lymph nodes: Cervical, supraclavicular, and axillary nodes normal. Neurologic: Alert and oriented X 3, normal strength and tone. Normal symmetric reflexes. Normal coordination and gait Psych- no depression, no anxiety      Assessment:    Healthy female exam.      Plan:    ghm utd Check labs See After Visit Summary for Counseling Recommendations  1. Routine general medical examination at a health care facility  - CBC w/Diff - Hepatic function panel - Lipid panel; Future - Basic Metabolic Panel (BMET) - TSH - POCT Urinalysis Dipstick  2. Generalized anxiety disorder con't meds and refer to psych - Ambulatory referral to Psychology - FLUoxetine (PROZAC) 10 MG capsule; Take 1 capsule (10 mg total) by mouth daily.  Dispense: 30 capsule; Refill: 5  3. ADD (attention deficit disorder)  Pt thinks she may have this - Ambulatory referral to Psychology  4. Depression with anxiety   -  buPROPion (WELLBUTRIN XL) 300 MG 24 hr tablet; Take 1 tablet (300 mg total) by mouth at bedtime.  Dispense: 30 tablet; Refill: 5  5. Sinus headache  - meloxicam (MOBIC) 15 MG tablet; 1/2 -1 po qd prn headache or pain  Dispense: 30 tablet; Refill: 3  6. Fibromyalgia  - meloxicam (MOBIC) 15 MG tablet; 1/2 -1 po qd prn headache or pain  Dispense: 30 tablet; Refill: 3

## 2013-06-16 ENCOUNTER — Encounter: Payer: Self-pay | Admitting: Family Medicine

## 2013-06-20 ENCOUNTER — Telehealth: Payer: Self-pay | Admitting: Pulmonary Disease

## 2013-06-20 NOTE — Telephone Encounter (Signed)
Home sleep study has been cancelled per pts request.  Will forward to [facility] to make him aware of pts request as stated below.

## 2013-06-20 NOTE — Telephone Encounter (Signed)
Called patient to schedule HST back on 05/20/13, pt stated to husband had lost his job and was unable to do study now. Called and spoke with [facility] and was able to get authorization extended to 06/20/13. Called and spoke with patient today, and she stated that she was just going to have to put this study off until next year due to her husband being without a job at the present time. Pt stated that she would call back next year to make an appointment with Dr. Gwenette Greet. Pt requested that to cancel this order and she will call us next year to arrange for an appointment.  Please cancel order for HST per patient and send phone note to Dr. Gwenette Greet as Juluis Rainier. Thanks [facility]

## 2013-06-23 NOTE — Telephone Encounter (Signed)
noted 

## 2013-08-01 ENCOUNTER — Telehealth: Payer: Self-pay | Admitting: Family Medicine

## 2013-08-01 NOTE — Telephone Encounter (Signed)
Caller name: Camber Relation to LK:[id number] Call back number:[phone] Pharmacy:  Reason for call: Patient called to speak with Dr Nonda Lou CMA to see what counselors did Dr Etter Sjogren recommend her to see. I Informed patient that we have two counselors here by the name of Marya Amsler and Clint Bolder but patient wanted to make sure that's what Dr Etter Sjogren suggests. Please advise.

## 2013-08-01 NOTE — Telephone Encounter (Signed)
Spoke with patient and she wanted to get the names of the Psych, she had the number that [place] gave her and she wanted get the specific names. I gave her Marya Amsler and Clint Bolder.     KP

## 2013-10-03 ENCOUNTER — Other Ambulatory Visit: Payer: Self-pay | Admitting: Family Medicine

## 2013-10-03 NOTE — Telephone Encounter (Signed)
Called pharmacy to verify.  They stated that patient still has a refill available and will get a call making her aware that it is ready.

## 2013-10-06 ENCOUNTER — Other Ambulatory Visit: Payer: Self-pay | Admitting: Family Medicine

## 2013-10-06 NOTE — Telephone Encounter (Signed)
Last seen and filled 06/03/13 #30 with 3 refills. Please advise.     KP

## 2013-10-06 NOTE — Telephone Encounter (Signed)
Last seen and filled 06/03/13 #30 with 3 refills. Patient is requesting a 90 day supply after 30 was already approved.  Please advise. KP

## 2013-10-17 ENCOUNTER — Telehealth: Payer: Self-pay

## 2013-10-17 DIAGNOSIS — F32A Depression, unspecified: Secondary | ICD-10-CM

## 2013-10-17 DIAGNOSIS — F329 Major depressive disorder, single episode, unspecified: Secondary | ICD-10-CM

## 2013-10-17 NOTE — Telephone Encounter (Signed)
Theresa [name] [phone]  [name] called inquiring about buPROPion (WELLBUTRIN XL) 300 MG 24 hr tablet, she stated that she is hungry all the time and she is wondering if this a side effect of this medicine.

## 2013-10-17 NOTE — Telephone Encounter (Signed)
Please advise if increased appetite is a side effect to medication.     KP

## 2013-10-18 NOTE — Telephone Encounter (Signed)
It typically not cause significantly increase appetite

## 2013-10-20 NOTE — Telephone Encounter (Signed)
Tried calling patient at [phone] and the VM was not setup yet.      KP

## 2013-10-29 NOTE — Telephone Encounter (Signed)
Spoke with patient and made her aware, she wanted to see Psych and said Dr.Lowne gave her a list but she does not have the list. I gave her some names off of the psych list for Dr.Kaur and [place] and she has agreed to call. She thinks she should be on a different medication and thinks Psych would be best to help her.    KP

## 2013-10-29 NOTE — Addendum Note (Signed)
Addended by: Ewing Schlein on: 10/29/2013 01:49 PM   Modules accepted: Orders

## 2013-10-29 NOTE — Telephone Encounter (Signed)
Patient called back and stated she has to wait until May before she gets in, Ref place for [employer] [facility].    KP

## 2013-11-05 ENCOUNTER — Other Ambulatory Visit: Payer: Self-pay | Admitting: Family Medicine

## 2013-11-25 ENCOUNTER — Telehealth: Payer: Self-pay | Admitting: Family Medicine

## 2013-11-25 NOTE — Telephone Encounter (Signed)
Pt had lab work in May and was told by the tech that she would check insurance to make sure it was covered 100% before it was sent out. Pt now has a $6,000.00 bill. Request to speak with management.

## 2013-12-03 NOTE — Telephone Encounter (Signed)
Called patients home and spoke with Husband, I was given work number [phone] which I called and was unable to leave a message

## 2013-12-12 ENCOUNTER — Ambulatory Visit: Payer: BC Managed Care – PPO | Admitting: Family Medicine

## 2014-01-27 ENCOUNTER — Encounter: Payer: Self-pay | Admitting: Family Medicine

## 2014-01-27 ENCOUNTER — Other Ambulatory Visit: Payer: Self-pay | Admitting: Family Medicine

## 2014-01-27 ENCOUNTER — Ambulatory Visit (INDEPENDENT_AMBULATORY_CARE_PROVIDER_SITE_OTHER): Payer: BC Managed Care – PPO | Admitting: Family Medicine

## 2014-01-27 VITALS — BP 122/80 | HR 79 | Temp 98.9°F | Wt 226.4 lb

## 2014-01-27 DIAGNOSIS — M791 Myalgia, unspecified site: Secondary | ICD-10-CM

## 2014-01-27 DIAGNOSIS — G4733 Obstructive sleep apnea (adult) (pediatric): Secondary | ICD-10-CM

## 2014-01-27 DIAGNOSIS — M79602 Pain in left arm: Secondary | ICD-10-CM

## 2014-01-27 DIAGNOSIS — G43009 Migraine without aura, not intractable, without status migrainosus: Secondary | ICD-10-CM

## 2014-01-27 DIAGNOSIS — R5382 Chronic fatigue, unspecified: Secondary | ICD-10-CM

## 2014-01-27 DIAGNOSIS — M62838 Other muscle spasm: Secondary | ICD-10-CM

## 2014-01-27 DIAGNOSIS — F418 Other specified anxiety disorders: Secondary | ICD-10-CM

## 2014-01-27 LAB — RHEUMATOID FACTOR: Rhuematoid fact SerPl-aCnc: <10 IU/mL (ref <=14)

## 2014-01-27 MED ORDER — BUPROPION HCL ER (XL) 300 MG PO TB24: 300.0000 mg | ORAL_TABLET | Freq: Every day | ORAL | Status: DC

## 2014-01-27 MED ORDER — CYCLOBENZAPRINE HCL 10 MG PO TABS: ORAL_TABLET | ORAL | Status: DC

## 2014-01-27 MED ORDER — MELOXICAM 15 MG PO TABS: ORAL_TABLET | ORAL | Status: DC

## 2014-01-27 NOTE — Patient Instructions (Signed)

## 2014-01-27 NOTE — Progress Notes (Signed)
Pre visit review using our clinic review tool, if applicable. No additional management support is needed unless otherwise documented below in the visit note. 

## 2014-01-28 LAB — BASIC METABOLIC PANEL
BUN: 19 mg/dL (ref 6–23)
CALCIUM: 9.5 mg/dL (ref 8.4–10.5)
CO2: 30 meq/L (ref 19–32)
CREATININE: 0.91 mg/dL (ref 0.40–1.20)
Chloride: 105 mEq/L (ref 96–112)
GFR: 67.06 mL/min (ref >60.00)
Glucose, Bld: 105 mg/dL — ABNORMAL HIGH (ref 70–99)
Potassium: 4.1 mEq/L (ref 3.5–5.1)
Sodium: 140 mEq/L (ref 135–145)

## 2014-01-28 LAB — CBC WITH DIFFERENTIAL/PLATELET
Basophils Absolute: 0 10*3/uL (ref 0.0–0.1)
Basophils Relative: 0.7 % (ref 0.0–3.0)
EOS PCT: 2.5 % (ref 0.0–5.0)
Eosinophils Absolute: 0.1 10*3/uL (ref 0.0–0.7)
HEMATOCRIT: 39.8 % (ref 36.0–46.0)
HEMOGLOBIN: 13.4 g/dL (ref 12.0–15.0)
LYMPHS ABS: 1.7 10*3/uL (ref 0.7–4.0)
Lymphocytes Relative: 35 % (ref 12.0–46.0)
MCHC: 33.6 g/dL (ref 30.0–36.0)
MCV: 81.9 fl (ref 78.0–100.0)
Monocytes Absolute: 0.4 10*3/uL (ref 0.1–1.0)
Monocytes Relative: 8.5 % (ref 3.0–12.0)
NEUTROS ABS: 2.6 10*3/uL (ref 1.4–7.7)
Neutrophils Relative %: 53.3 % (ref 43.0–77.0)
Platelets: 247 10*3/uL (ref 150.0–400.0)
RBC: 4.86 Mil/uL (ref 3.87–5.11)
RDW: 16.1 % — AB (ref 11.5–15.5)
WBC: 5 10*3/uL (ref 4.0–10.5)

## 2014-01-28 LAB — TSH: TSH: 1.86 u[IU]/mL (ref 0.35–4.50)

## 2014-01-28 LAB — HEPATIC FUNCTION PANEL
ALT: 20 U/L (ref 0–35)
AST: 17 U/L (ref 0–37)
Albumin: 4.1 g/dL (ref 3.5–5.2)
Alkaline Phosphatase: 59 U/L (ref 39–117)
BILIRUBIN TOTAL: 0.3 mg/dL (ref 0.2–1.2)
Bilirubin, Direct: 0 mg/dL (ref 0.0–0.3)
Total Protein: 7.2 g/dL (ref 6.0–8.3)

## 2014-01-28 LAB — ANA: ANA: NEGATIVE

## 2014-01-28 LAB — SEDIMENTATION RATE: Sed Rate: 27 mm/hr — ABNORMAL HIGH (ref 0–22)

## 2014-01-28 LAB — VITAMIN B12: Vitamin B-12: 126 pg/mL — ABNORMAL LOW (ref 211–911)

## 2014-01-28 NOTE — Progress Notes (Signed)
Subjective:    Patient ID: Theresa [name], female    DOB: [birth year], 59 y.o.   MRN: [id number]  HPI Pt here to f/u with depression..  She c/o not sleeping great, feeling tired all the time-- she was scheduled for home sleep study but she could not afford it at the time.  She is ready to [name] it now.  She is also c/o L shoulder and arm pain,  No injury.  She is concerned about her heart---no chest pain, sob. She is also c/o extreme fatigue and weight gain.  She is not exercising because she struggles with pain .     Past Medical History  Diagnosis Date  . Hypertension   . Breast cancer     Left breast  . Pleurisy   . Depression   . GERD (gastroesophageal reflux disease)   . History of stomach ulcers   . Migraines   . Urinary incontinence   . History of blood transfusion    History   Social History  . Marital Status: Married    Spouse Name: N/A    Number of Children: N/A  . Years of Education: N/A   Occupational History  . [employer]   Social History Main Topics  . Smoking status: Never Smoker   . Smokeless tobacco: Not on file  . Alcohol Use: Yes     Comment: occasionally  . Drug Use: No  . Sexual Activity: Not on file   Other Topics Concern  . Not on file   Social History Narrative   Current Outpatient Prescriptions  Medication Sig Dispense Refill  . cyclobenzaprine (FLEXERIL) 10 MG tablet TAKE 1 TABLET BY MOUTH AS NEEDED FOR MUSCLE SPASMS 30 tablet 0  . fluticasone (FLONASE) 50 MCG/ACT nasal spray Place 2 sprays into both nostrils daily as needed.    . loratadine (CLARITIN) 10 MG tablet Take 10 mg by mouth daily as needed.    . meloxicam (MOBIC) 15 MG tablet TAKE 1/2 TO 1 TABLET BY MOUTH EVERY DAY AS NEEDED FOR HEADACHE OR PAIN. 90 tablet 3  . montelukast (SINGULAIR) 10 MG tablet Take 10 mg by mouth at bedtime as needed.    Marland Kitchen buPROPion (WELLBUTRIN XL) 300 MG 24 hr tablet TAKE 1 TABLET BY MOUTH DAILY 90 tablet 1   No current  facility-administered medications for this visit.      Review of Systems  Constitutional: Negative for fever, chills, activity change, appetite change and fatigue.  Respiratory: Negative for cough and shortness of breath.   Cardiovascular: Negative for chest pain and palpitations.  Gastrointestinal: Negative for abdominal pain and abdominal distention.  Genitourinary: Negative for dysuria, frequency and flank pain.  Musculoskeletal: Positive for myalgias and arthralgias.  Neurological: Negative for dizziness, weakness, light-headedness, numbness and headaches.       Pain in L arm/ shoulder--- no known injury  Psychiatric/Behavioral: Positive for sleep disturbance and dysphoric mood. Negative for suicidal ideas, behavioral problems, confusion, decreased concentration and agitation.       Objective:   Physical Exam BP 122/80 mmHg  Pulse 79  Temp(Src) 98.9 F (37.2 C) (Oral)  Wt 226 lb 6.4 oz (102.694 kg)  SpO2 98% General appearance: alert, cooperative, appears stated age and no distress Throat: lips, mucosa, and tongue normal; teeth and gums normal Neck: no adenopathy, no carotid bruit, no JVD, supple, symmetrical, trachea midline and thyroid not enlarged, symmetric, no tenderness/mass/nodules Lungs: clear to auscultation bilaterally Heart: regular rate and rhythm, S1,  S2 normal, no murmur, click, rub or gallop Extremities: extremities normal, atraumatic, no cyanosis or edema Neurologic: Alert and oriented X 3, normal strength and tone. Normal symmetric reflexes. Normal coordination and gait       Assessment & Plan:  1. Depression with anxiety con't wellbutrin  stable  2. Migraine without aura and without status migrainosus, not intractable Stable, con't med - meloxicam (MOBIC) 15 MG tablet; TAKE 1/2 TO 1 TABLET BY MOUTH EVERY DAY AS NEEDED FOR HEADACHE OR PAIN.  Dispense: 90 tablet; Refill: 3  3. Muscle spasm   - cyclobenzaprine (FLEXERIL) 10 MG tablet; TAKE 1 TABLET  BY MOUTH AS NEEDED FOR MUSCLE SPASMS  Dispense: 30 tablet; Refill: 0  4. Chronic fatigue   - Basic metabolic panel - CBC with Differential - Hepatic function panel - TSH - Vitamin B12 - Vitamin D 1,25 dihydroxy - ANA - Rheumatoid factor - Sedimentation rate  5. Myalgia Check labs - Basic metabolic panel - CBC with Differential - Hepatic function panel - TSH - Vitamin B12 - Vitamin D 1,25 dihydroxy - ANA - Rheumatoid factor - Sedimentation rate  6. Left arm pain prob MS --- ekg nsr - EKG 12-Lead

## 2014-01-29 ENCOUNTER — Encounter: Payer: Self-pay | Admitting: Family Medicine

## 2014-01-30 LAB — VITAMIN D 1,25 DIHYDROXY
VITAMIN D 1, 25 (OH) TOTAL: 53 pg/mL (ref 18–72)
VITAMIN D3 1, 25 (OH): 53 pg/mL

## 2014-02-05 ENCOUNTER — Other Ambulatory Visit: Payer: Self-pay | Admitting: Family Medicine

## 2014-02-05 ENCOUNTER — Telehealth: Payer: Self-pay | Admitting: Family Medicine

## 2014-02-05 MED ORDER — TOPIRAMATE 25 MG PO TABS: ORAL_TABLET | ORAL | Status: DC

## 2014-02-05 NOTE — Telephone Encounter (Signed)
topamax 25 mg #42  1 po qpm x 1 week then 1 po bid x 1 week then 1 po qam and 2 po qpm x 1 week Then 50 mg bid  Ov in 1 month

## 2014-02-05 NOTE — Telephone Encounter (Signed)
Detailed message left advising Rx faxed.      KP

## 2014-02-05 NOTE — Telephone Encounter (Signed)
Pt did not c/o of having them that often when I saw her--- we do use topamax for people that have several migraines a month.   I was under the impression that they were improved.   It is something she may benefit from

## 2014-02-05 NOTE — Telephone Encounter (Signed)
Spoke with patient and she stated she has a lot of Migraines (about 85 percent of the time) and she said someone told her about a migraine medication that they are on daily to prevent the HA. The medication is Topamax and the patient wants to know if you thinks he could benefit from it. Please advise        KP

## 2014-02-05 NOTE — Telephone Encounter (Signed)
Caller name: Velecia Relation to pt: self Call back number: [phone] Pharmacy:  Reason for call:   Wants to know if phentermine had a good affect on her??

## 2014-02-27 ENCOUNTER — Ambulatory Visit: Payer: BC Managed Care – PPO | Admitting: Pulmonary Disease

## 2014-03-13 ENCOUNTER — Other Ambulatory Visit: Payer: Self-pay | Admitting: Neurology

## 2014-03-13 DIAGNOSIS — Z853 Personal history of malignant neoplasm of breast: Secondary | ICD-10-CM

## 2014-03-13 DIAGNOSIS — R51 Headache: Principal | ICD-10-CM

## 2014-03-13 DIAGNOSIS — R519 Headache, unspecified: Secondary | ICD-10-CM

## 2014-04-06 ENCOUNTER — Telehealth: Payer: Self-pay | Admitting: Family Medicine

## 2014-04-06 NOTE — Telephone Encounter (Signed)
Caller name: Shaeleigh, Graw A Relation to pt: self  Call back number: [phone] mobile    Reason for call:  Pt would like phentermine  for weight lost and pt has experienced weight gain due to medication she is currently on. Advised pt to schedule appointment pt states she was recently seen by MD and MD is aware of weight gain. Please advise

## 2014-04-06 NOTE — Telephone Encounter (Signed)
Please advise , she on 01/27/14 and EKG was done at that time. Please advise.      KP

## 2014-04-06 NOTE — Telephone Encounter (Signed)
adipex 37.5  1 po qam #30  No refills Ov 1 month

## 2014-04-07 MED ORDER — PHENTERMINE HCL 37.5 MG PO CAPS: 37.5000 mg | ORAL_CAPSULE | Freq: Every morning | ORAL | Status: DC

## 2014-04-07 NOTE — Telephone Encounter (Signed)
Tried calling the patient and the VM is not setup, the Rx has been printed and left at check in.       KP

## 2014-04-08 ENCOUNTER — Telehealth: Payer: Self-pay | Admitting: *Deleted

## 2014-04-08 NOTE — Telephone Encounter (Signed)
Prior authorization for phentermine initiated. Awaiting determination. JG//CMA

## 2014-04-10 ENCOUNTER — Ambulatory Visit
Admission: RE | Admit: 2014-04-10 | Discharge: 2014-04-10 | Disposition: A | Discharge status: Home / Self Care | Payer: BC Managed Care – PPO | Source: Ambulatory Visit | Attending: Neurology | Admitting: Neurology

## 2014-04-10 DIAGNOSIS — Z853 Personal history of malignant neoplasm of breast: Secondary | ICD-10-CM

## 2014-04-10 DIAGNOSIS — R519 Headache, unspecified: Secondary | ICD-10-CM

## 2014-04-10 DIAGNOSIS — R51 Headache: Principal | ICD-10-CM

## 2014-04-10 MED ORDER — GADOBENATE DIMEGLUMINE 529 MG/ML IV SOLN: 20.0000 mL | Freq: Once | INTRAVENOUS | Status: AC | PRN

## 2014-04-13 ENCOUNTER — Ambulatory Visit: Payer: BC Managed Care – PPO | Admitting: Pulmonary Disease

## 2014-05-19 ENCOUNTER — Other Ambulatory Visit: Payer: Self-pay | Admitting: Family Medicine

## 2014-05-25 ENCOUNTER — Encounter: Payer: Self-pay | Admitting: Family Medicine

## 2014-05-25 ENCOUNTER — Ambulatory Visit (INDEPENDENT_AMBULATORY_CARE_PROVIDER_SITE_OTHER): Payer: BC Managed Care – PPO | Admitting: Family Medicine

## 2014-05-25 VITALS — BP 118/80 | HR 78 | Temp 98.1°F | Resp 18 | Wt 218.0 lb

## 2014-05-25 DIAGNOSIS — S90221A Contusion of right lesser toe(s) with damage to nail, initial encounter: Secondary | ICD-10-CM

## 2014-05-25 DIAGNOSIS — S90229A Contusion of unspecified lesser toe(s) with damage to nail, initial encounter: Secondary | ICD-10-CM | POA: Insufficient documentation

## 2014-05-25 DIAGNOSIS — L608 Other nail disorders: Secondary | ICD-10-CM

## 2014-05-25 DIAGNOSIS — E669 Obesity, unspecified: Secondary | ICD-10-CM | POA: Diagnosis not present

## 2014-05-25 MED ORDER — PHENTERMINE HCL 37.5 MG PO CAPS: 37.5000 mg | ORAL_CAPSULE | Freq: Every morning | ORAL | Status: DC

## 2014-05-25 NOTE — Patient Instructions (Signed)

## 2014-05-25 NOTE — Progress Notes (Signed)
Subjective:    Patient ID: Theresa [name], female    DOB: [birth year], 59 y.o.   MRN: [id number]  HPI  Patient here for f/u adipex.  Pt was exercising and she was feeling better but when she ran out she got very tired.   R third toe with dark nail---no trauma.    Past Medical History  Diagnosis Date  . Hypertension   . Breast cancer     Left breast  . Pleurisy   . Depression   . GERD (gastroesophageal reflux disease)   . History of stomach ulcers   . Migraines   . Urinary incontinence   . History of blood transfusion     Review of Systems  Constitutional: Positive for fatigue. Negative for activity change, appetite change and unexpected weight change.  Respiratory: Negative for cough and shortness of breath.   Cardiovascular: Negative for chest pain and palpitations.  Gastrointestinal: Negative for abdominal pain and abdominal distention.  Psychiatric/Behavioral: Negative for behavioral problems, sleep disturbance and dysphoric mood. The patient is not nervous/anxious.     Current Outpatient Prescriptions on File Prior to Visit  Medication Sig Dispense Refill  . buPROPion (WELLBUTRIN XL) 300 MG 24 hr tablet TAKE 1 TABLET BY MOUTH DAILY 90 tablet 1  . fluticasone (FLONASE) 50 MCG/ACT nasal spray Place 2 sprays into both nostrils daily as needed.    . loratadine (CLARITIN) 10 MG tablet Take 10 mg by mouth daily as needed.    . topiramate (TOPAMAX) 25 MG tablet 1 po qpm for1 week,  then 1 po bid for 1 week, then 1 po qam and 2 po qpm for 1 week, then 50 mg bid (Patient taking differently: 100 mg. TAKE FOUR (4) TABS BY MOUTH BEFORE BED) 42 tablet 0   No current facility-administered medications on file prior to visit.       Objective:    Physical Exam  Constitutional: She is oriented to person, place, and time. She appears well-developed and well-nourished.  HENT:  Head: Normocephalic and atraumatic.  Eyes: Conjunctivae and EOM are normal.  Neck: Normal range of motion.  Neck supple. No JVD present. Carotid bruit is not present. No thyromegaly present.  Cardiovascular: Normal rate, regular rhythm and normal heart sounds.   No murmur heard. Pulmonary/Chest: Effort normal and breath sounds normal. No respiratory distress. She has no wheezes. She has no rales. She exhibits no tenderness.  Musculoskeletal: She exhibits no edema.  Neurological: She is alert and oriented to person, place, and time.  Skin:  R 2nd toe-- nail dark --? Mole  Pt denies bruising  Psychiatric: She has a normal mood and affect. Her behavior is normal. Judgment and thought content normal.    BP 118/80 mmHg  Pulse 78  Temp(Src) 98.1 F (36.7 C) (Oral)  Resp 18  Wt 218 lb (98.884 kg)  SpO2 98% Wt Readings from Last 3 Encounters:  05/25/14 218 lb (98.884 kg)  01/27/14 226 lb 6.4 oz (102.694 kg)  06/03/13 220 lb (99.791 kg)     Lab Results  Component Value Date   WBC 5.0 01/27/2014   HGB 13.4 01/27/2014   HCT 39.8 01/27/2014   PLT 247.0 01/27/2014   GLUCOSE 105* 01/27/2014   ALT 20 01/27/2014   AST 17 01/27/2014   NA 140 01/27/2014   K 4.1 01/27/2014   CL 105 01/27/2014   CREATININE 0.91 01/27/2014   BUN 19 01/27/2014   CO2 30 01/27/2014   TSH 1.86 01/27/2014  Assessment & Plan:   Problem List Items Addressed This Visit    Toenail bruise    Pt will f/u with podiatry and try otc med for fungus      Obesity (BMI 30-39.9) - Primary   Relevant Medications   phentermine 37.5 MG capsule    Other Visit Diagnoses    Discolored nails         con't diet and exercise-- rto 1 month  I have discontinued Ms. Wahlquist's montelukast, meloxicam, and cyclobenzaprine. I am also having her maintain her loratadine, fluticasone, buPROPion, topiramate, and phentermine.  Meds ordered this encounter  Medications  . phentermine 37.5 MG capsule    Sig: Take 1 capsule (37.5 mg total) by mouth every morning.    Dispense:  30 capsule    Refill:  0     Garnet Koyanagi, [name]

## 2014-05-25 NOTE — Progress Notes (Signed)
Pre visit review using our clinic review tool, if applicable. No additional management support is needed unless otherwise documented below in the visit note. 

## 2014-05-25 NOTE — Assessment & Plan Note (Signed)
Pt will f/u with podiatry and try otc med for fungus

## 2014-05-27 ENCOUNTER — Telehealth: Payer: Self-pay | Admitting: Family Medicine

## 2014-05-27 DIAGNOSIS — S90221A Contusion of right lesser toe(s) with damage to nail, initial encounter: Secondary | ICD-10-CM

## 2014-05-27 DIAGNOSIS — L608 Other nail disorders: Secondary | ICD-10-CM

## 2014-05-27 NOTE — Telephone Encounter (Signed)
Caller name: Nesta Relation to pt: self  Call back number: [phone] Pharmacy:  Reason for call:   Requesting referral to Dr. Leane Call in [facility]. Podiatrist. Has appointment on 05/29/14. Has [employer]. Dr. Joya Gaskins office is requesting office notes as well. P: [phone]

## 2014-05-28 NOTE — Telephone Encounter (Signed)
Rx faxed.    KP 

## 2014-06-19 ENCOUNTER — Encounter: Payer: BC Managed Care – PPO | Admitting: Family Medicine

## 2014-07-27 ENCOUNTER — Telehealth: Payer: Self-pay | Admitting: Family Medicine

## 2014-07-27 NOTE — Telephone Encounter (Signed)
pre visit letter mailed 07/24/14

## 2014-08-14 ENCOUNTER — Encounter: Payer: BC Managed Care – PPO | Admitting: Family Medicine

## 2014-09-28 ENCOUNTER — Encounter: Payer: Self-pay | Admitting: Family Medicine

## 2014-09-28 ENCOUNTER — Ambulatory Visit (INDEPENDENT_AMBULATORY_CARE_PROVIDER_SITE_OTHER): Payer: BC Managed Care – PPO | Admitting: Family Medicine

## 2014-09-28 VITALS — BP 116/80 | HR 71 | Temp 98.0°F | Ht 65.0 in | Wt 214.8 lb

## 2014-09-28 DIAGNOSIS — F32A Depression, unspecified: Secondary | ICD-10-CM

## 2014-09-28 DIAGNOSIS — T148 Other injury of unspecified body region: Secondary | ICD-10-CM | POA: Diagnosis not present

## 2014-09-28 DIAGNOSIS — Z0000 Encounter for general adult medical examination without abnormal findings: Secondary | ICD-10-CM | POA: Diagnosis not present

## 2014-09-28 DIAGNOSIS — Z1239 Encounter for other screening for malignant neoplasm of breast: Secondary | ICD-10-CM

## 2014-09-28 DIAGNOSIS — W57XXXA Bitten or stung by nonvenomous insect and other nonvenomous arthropods, initial encounter: Secondary | ICD-10-CM

## 2014-09-28 DIAGNOSIS — E2839 Other primary ovarian failure: Secondary | ICD-10-CM

## 2014-09-28 DIAGNOSIS — G4700 Insomnia, unspecified: Secondary | ICD-10-CM

## 2014-09-28 DIAGNOSIS — Z23 Encounter for immunization: Secondary | ICD-10-CM | POA: Diagnosis not present

## 2014-09-28 DIAGNOSIS — F329 Major depressive disorder, single episode, unspecified: Secondary | ICD-10-CM | POA: Diagnosis not present

## 2014-09-28 LAB — POCT URINALYSIS DIPSTICK
Bilirubin, UA: NEGATIVE
GLUCOSE UA: NEGATIVE
Ketones, UA: NEGATIVE
Leukocytes, UA: NEGATIVE
NITRITE UA: NEGATIVE
Protein, UA: NEGATIVE
RBC UA: NEGATIVE
Spec Grav, UA: >=1.03
UROBILINOGEN UA: 0.2
pH, UA: 6

## 2014-09-28 MED ORDER — PHENTERMINE HCL 37.5 MG PO TABS
37.5000 mg | ORAL_TABLET | Freq: Every day | ORAL | Status: DC
Start: 2014-09-28 — End: 2016-03-17

## 2014-09-28 MED ORDER — TRAZODONE HCL 50 MG PO TABS: 25.0000 mg | ORAL_TABLET | Freq: Every evening | ORAL | Status: DC | PRN

## 2014-09-28 MED ORDER — VORTIOXETINE HBR 10 MG PO TABS: ORAL_TABLET | ORAL | Status: DC

## 2014-09-28 NOTE — Progress Notes (Signed)
Pre visit review using our clinic review tool, if applicable. No additional management support is needed unless otherwise documented below in the visit note. 

## 2014-09-28 NOTE — Patient Instructions (Signed)
Preventive Care for Adults A healthy lifestyle and preventive care can promote health and wellness. Preventive health guidelines for women include the following key practices.  A routine yearly physical is a good way to check with your health care provider about your health and preventive screening. It is a chance to share any concerns and updates on your health and to receive a thorough exam.  Visit your dentist for a routine exam and preventive care every 6 months. Brush your teeth twice a day and floss once a day. Good oral hygiene prevents tooth decay and gum disease.  The frequency of eye exams is based on your age, health, family medical history, use of contact lenses, and other factors. Follow your health care provider's recommendations for frequency of eye exams.  Eat a healthy diet. Foods like vegetables, fruits, whole grains, low-fat dairy products, and lean protein foods contain the nutrients you need without too many calories. Decrease your intake of foods high in solid fats, added sugars, and salt. Eat the right amount of calories for you.Get information about a proper diet from your health care provider, if necessary.  Regular physical exercise is one of the most important things you can do for your health. Most adults should get at least 150 minutes of moderate-intensity exercise (any activity that increases your heart rate and causes you to sweat) each week. In addition, most adults need muscle-strengthening exercises on 2 or more days a week.  Maintain a healthy weight. The body mass index (BMI) is a screening tool to identify possible weight problems. It provides an estimate of body fat based on height and weight. Your health care provider can find your BMI and can help you achieve or maintain a healthy weight.For adults 20 years and older:  A BMI below 18.5 is considered underweight.  A BMI of 18.5 to 24.9 is normal.  A BMI of 25 to 29.9 is considered overweight.  A BMI of  30 and above is considered obese.  Maintain normal blood lipids and cholesterol levels by exercising and minimizing your intake of saturated fat. Eat a balanced diet with plenty of fruit and vegetables. Blood tests for lipids and cholesterol should begin at age 76 and be repeated every 5 years. If your lipid or cholesterol levels are high, you are over 50, or you are at high risk for heart disease, you may need your cholesterol levels checked more frequently.Ongoing high lipid and cholesterol levels should be treated with medicines if diet and exercise are not working.  If you smoke, find out from your health care provider how to quit. If you do not use tobacco, do not start.  Lung cancer screening is recommended for adults aged 22-80 years who are at high risk for developing lung cancer because of a history of smoking. A yearly low-dose CT scan of the lungs is recommended for people who have at least a 30-pack-year history of smoking and are a current smoker or have quit within the past 15 years. A pack year of smoking is smoking an average of 1 pack of cigarettes a day for 1 year (for example: 1 pack a day for 30 years or 2 packs a day for 15 years). Yearly screening should continue until the smoker has stopped smoking for at least 15 years. Yearly screening should be stopped for people who develop a health problem that would prevent them from having lung cancer treatment.  If you are pregnant, do not drink alcohol. If you are breastfeeding,  be very cautious about drinking alcohol. If you are not pregnant and choose to drink alcohol, do not have more than 1 drink per day. One drink is considered to be 12 ounces (355 mL) of beer, 5 ounces (148 mL) of wine, or 1.5 ounces (44 mL) of liquor.  Avoid use of street drugs. Do not share needles with anyone. Ask for help if you need support or instructions about stopping the use of drugs.  High blood pressure causes heart disease and increases the risk of  stroke. Your blood pressure should be checked at least every 1 to 2 years. Ongoing high blood pressure should be treated with medicines if weight loss and exercise do not work.  If you are 75-52 years old, ask your health care provider if you should take aspirin to prevent strokes.  Diabetes screening involves taking a blood sample to check your fasting blood sugar level. This should be done once every 3 years, after age 15, if you are within normal weight and without risk factors for diabetes. Testing should be considered at a younger age or be carried out more frequently if you are overweight and have at least 1 risk factor for diabetes.  Breast cancer screening is essential preventive care for women. You should practice "breast self-awareness." This means understanding the normal appearance and feel of your breasts and may include breast self-examination. Any changes detected, no matter how small, should be reported to a health care provider. Women in their 58s and 30s should have a clinical breast exam (CBE) by a health care provider as part of a regular health exam every 1 to 3 years. After age 16, women should have a CBE every year. Starting at age 53, women should consider having a mammogram (breast X-ray test) every year. Women who have a family history of breast cancer should talk to their health care provider about genetic screening. Women at a high risk of breast cancer should talk to their health care providers about having an MRI and a mammogram every year.  Breast cancer gene (BRCA)-related cancer risk assessment is recommended for women who have family members with BRCA-related cancers. BRCA-related cancers include breast, ovarian, tubal, and peritoneal cancers. Having family members with these cancers may be associated with an increased risk for harmful changes (mutations) in the breast cancer genes BRCA1 and BRCA2. Results of the assessment will determine the need for genetic counseling and  BRCA1 and BRCA2 testing.  Routine pelvic exams to screen for cancer are no longer recommended for nonpregnant women who are considered low risk for cancer of the pelvic organs (ovaries, uterus, and vagina) and who do not have symptoms. Ask your health care provider if a screening pelvic exam is right for you.  If you have had past treatment for cervical cancer or a condition that could lead to cancer, you need Pap tests and screening for cancer for at least 20 years after your treatment. If Pap tests have been discontinued, your risk factors (such as having a new sexual partner) need to be reassessed to determine if screening should be resumed. Some women have medical problems that increase the chance of getting cervical cancer. In these cases, your health care provider may recommend more frequent screening and Pap tests.  The HPV test is an additional test that may be used for cervical cancer screening. The HPV test looks for the virus that can cause the cell changes on the cervix. The cells collected during the Pap test can be  tested for HPV. The HPV test could be used to screen women aged 30 years and older, and should be used in women of any age who have unclear Pap test results. After the age of 30, women should have HPV testing at the same frequency as a Pap test.  Colorectal cancer can be detected and often prevented. Most routine colorectal cancer screening begins at the age of 50 years and continues through age 75 years. However, your health care provider may recommend screening at an earlier age if you have risk factors for colon cancer. On a yearly basis, your health care provider may provide home test kits to check for hidden blood in the stool. Use of a small camera at the end of a tube, to directly examine the colon (sigmoidoscopy or colonoscopy), can detect the earliest forms of colorectal cancer. Talk to your health care provider about this at age 50, when routine screening begins. Direct  exam of the colon should be repeated every 5-10 years through age 75 years, unless early forms of pre-cancerous polyps or small growths are found.  People who are at an increased risk for hepatitis B should be screened for this virus. You are considered at high risk for hepatitis B if:  You were born in a country where hepatitis B occurs often. Talk with your health care provider about which countries are considered high risk.  Your parents were born in a high-risk country and you have not received a shot to protect against hepatitis B (hepatitis B vaccine).  You have HIV or AIDS.  You use needles to inject street drugs.  You live with, or have sex with, someone who has hepatitis B.  You get hemodialysis treatment.  You take certain medicines for conditions like cancer, organ transplantation, and autoimmune conditions.  Hepatitis C blood testing is recommended for all people born from 1945 through 1965 and any individual with known risks for hepatitis C.  Practice safe sex. Use condoms and avoid high-risk sexual practices to reduce the spread of sexually transmitted infections (STIs). STIs include gonorrhea, chlamydia, syphilis, trichomonas, herpes, HPV, and human immunodeficiency virus (HIV). Herpes, HIV, and HPV are viral illnesses that have no cure. They can result in disability, cancer, and death.  You should be screened for sexually transmitted illnesses (STIs) including gonorrhea and chlamydia if:  You are sexually active and are younger than 24 years.  You are older than 24 years and your health care provider tells you that you are at risk for this type of infection.  Your sexual activity has changed since you were last screened and you are at an increased risk for chlamydia or gonorrhea. Ask your health care provider if you are at risk.  If you are at risk of being infected with HIV, it is recommended that you take a prescription medicine daily to prevent HIV infection. This is  called preexposure prophylaxis (PrEP). You are considered at risk if:  You are a heterosexual woman, are sexually active, and are at increased risk for HIV infection.  You take drugs by injection.  You are sexually active with a partner who has HIV.  Talk with your health care provider about whether you are at high risk of being infected with HIV. If you choose to begin PrEP, you should first be tested for HIV. You should then be tested every 3 months for as long as you are taking PrEP.  Osteoporosis is a disease in which the bones lose minerals and strength   with aging. This can result in serious bone fractures or breaks. The risk of osteoporosis can be identified using a bone density scan. Women ages 65 years and over and women at risk for fractures or osteoporosis should discuss screening with their health care providers. Ask your health care provider whether you should take a calcium supplement or vitamin D to reduce the rate of osteoporosis.  Menopause can be associated with physical symptoms and risks. Hormone replacement therapy is available to decrease symptoms and risks. You should talk to your health care provider about whether hormone replacement therapy is right for you.  Use sunscreen. Apply sunscreen liberally and repeatedly throughout the day. You should seek shade when your shadow is shorter than you. Protect yourself by wearing long sleeves, pants, a wide-brimmed hat, and sunglasses year round, whenever you are outdoors.  Once a month, do a whole body skin exam, using a mirror to look at the skin on your back. Tell your health care provider of new moles, moles that have irregular borders, moles that are larger than a pencil eraser, or moles that have changed in shape or color.  Stay current with required vaccines (immunizations).  Influenza vaccine. All adults should be immunized every year.  Tetanus, diphtheria, and acellular pertussis (Td, Tdap) vaccine. Pregnant women should  receive 1 dose of Tdap vaccine during each pregnancy. The dose should be obtained regardless of the length of time since the last dose. Immunization is preferred during the 27th-36th week of gestation. An adult who has not previously received Tdap or who does not know her vaccine status should receive 1 dose of Tdap. This initial dose should be followed by tetanus and diphtheria toxoids (Td) booster doses every 10 years. Adults with an unknown or incomplete history of completing a 3-dose immunization series with Td-containing vaccines should begin or complete a primary immunization series including a Tdap dose. Adults should receive a Td booster every 10 years.  Varicella vaccine. An adult without evidence of immunity to varicella should receive 2 doses or a second dose if she has previously received 1 dose. Pregnant females who do not have evidence of immunity should receive the first dose after pregnancy. This first dose should be obtained before leaving the health care facility. The second dose should be obtained 4-8 weeks after the first dose.  Human papillomavirus (HPV) vaccine. Females aged 13-26 years who have not received the vaccine previously should obtain the 3-dose series. The vaccine is not recommended for use in pregnant females. However, pregnancy testing is not needed before receiving a dose. If a female is found to be pregnant after receiving a dose, no treatment is needed. In that case, the remaining doses should be delayed until after the pregnancy. Immunization is recommended for any person with an immunocompromised condition through the age of 26 years if she did not get any or all doses earlier. During the 3-dose series, the second dose should be obtained 4-8 weeks after the first dose. The third dose should be obtained 24 weeks after the first dose and 16 weeks after the second dose.  Zoster vaccine. One dose is recommended for adults aged 60 years or older unless certain conditions are  present.  Measles, mumps, and rubella (MMR) vaccine. Adults born before 1957 generally are considered immune to measles and mumps. Adults born in 1957 or later should have 1 or more doses of MMR vaccine unless there is a contraindication to the vaccine or there is laboratory evidence of immunity to   each of the three diseases. A routine second dose of MMR vaccine should be obtained at least 28 days after the first dose for students attending postsecondary schools, health care workers, or international travelers. People who received inactivated measles vaccine or an unknown type of measles vaccine during 1963-1967 should receive 2 doses of MMR vaccine. People who received inactivated mumps vaccine or an unknown type of mumps vaccine before 1979 and are at high risk for mumps infection should consider immunization with 2 doses of MMR vaccine. For females of childbearing age, rubella immunity should be determined. If there is no evidence of immunity, females who are not pregnant should be vaccinated. If there is no evidence of immunity, females who are pregnant should delay immunization until after pregnancy. Unvaccinated health care workers born before 1957 who lack laboratory evidence of measles, mumps, or rubella immunity or laboratory confirmation of disease should consider measles and mumps immunization with 2 doses of MMR vaccine or rubella immunization with 1 dose of MMR vaccine.  Pneumococcal 13-valent conjugate (PCV13) vaccine. When indicated, a person who is uncertain of her immunization history and has no record of immunization should receive the PCV13 vaccine. An adult aged 19 years or older who has certain medical conditions and has not been previously immunized should receive 1 dose of PCV13 vaccine. This PCV13 should be followed with a dose of pneumococcal polysaccharide (PPSV23) vaccine. The PPSV23 vaccine dose should be obtained at least 8 weeks after the dose of PCV13 vaccine. An adult aged 19  years or older who has certain medical conditions and previously received 1 or more doses of PPSV23 vaccine should receive 1 dose of PCV13. The PCV13 vaccine dose should be obtained 1 or more years after the last PPSV23 vaccine dose.  Pneumococcal polysaccharide (PPSV23) vaccine. When PCV13 is also indicated, PCV13 should be obtained first. All adults aged 65 years and older should be immunized. An adult younger than age 65 years who has certain medical conditions should be immunized. Any person who resides in a nursing home or long-term care facility should be immunized. An adult smoker should be immunized. People with an immunocompromised condition and certain other conditions should receive both PCV13 and PPSV23 vaccines. People with human immunodeficiency virus (HIV) infection should be immunized as soon as possible after diagnosis. Immunization during chemotherapy or radiation therapy should be avoided. Routine use of PPSV23 vaccine is not recommended for American Indians, [place] Natives, or people younger than 65 years unless there are medical conditions that require PPSV23 vaccine. When indicated, people who have unknown immunization and have no record of immunization should receive PPSV23 vaccine. One-time revaccination 5 years after the first dose of PPSV23 is recommended for people aged 19-64 years who have chronic kidney failure, nephrotic syndrome, asplenia, or immunocompromised conditions. People who received 1-2 doses of PPSV23 before age 65 years should receive another dose of PPSV23 vaccine at age 65 years or later if at least 5 years have passed since the previous dose. Doses of PPSV23 are not needed for people immunized with PPSV23 at or after age 65 years.  Meningococcal vaccine. Adults with asplenia or persistent complement component deficiencies should receive 2 doses of quadrivalent meningococcal conjugate (MenACWY-D) vaccine. The doses should be obtained at least 2 months apart.  Microbiologists working with certain meningococcal bacteria, military recruits, people at risk during an outbreak, and people who travel to or live in countries with a high rate of meningitis should be immunized. A first-year college student up through age   21 years who is living in a residence hall should receive a dose if she did not receive a dose on or after her 16th birthday. Adults who have certain high-risk conditions should receive one or more doses of vaccine.  Hepatitis A vaccine. Adults who wish to be protected from this disease, have certain high-risk conditions, work with hepatitis A-infected animals, work in hepatitis A research labs, or travel to or work in countries with a high rate of hepatitis A should be immunized. Adults who were previously unvaccinated and who anticipate close contact with an international adoptee during the first 60 days after arrival in the [place] from a country with a high rate of hepatitis A should be immunized.  Hepatitis B vaccine. Adults who wish to be protected from this disease, have certain high-risk conditions, may be exposed to blood or other infectious body fluids, are household contacts or sex partners of hepatitis B positive people, are clients or workers in certain care facilities, or travel to or work in countries with a high rate of hepatitis B should be immunized.  Haemophilus influenzae type b (Hib) vaccine. A previously unvaccinated person with asplenia or sickle cell disease or having a scheduled splenectomy should receive 1 dose of Hib vaccine. Regardless of previous immunization, a recipient of a hematopoietic stem cell transplant should receive a 3-dose series 6-12 months after her successful transplant. Hib vaccine is not recommended for adults with HIV infection. Preventive Services / Frequency Ages 64 to 68 years  Blood pressure check.** / Every 1 to 2 years.  Lipid and cholesterol check.** / Every 5 years beginning at age  22.  Clinical breast exam.** / Every 3 years for women in their 88s and 53s.  BRCA-related cancer risk assessment.** / For women who have family members with a BRCA-related cancer (breast, ovarian, tubal, or peritoneal cancers).  Pap test.** / Every 2 years from ages 90 through 51. Every 3 years starting at age 21 through age 56 or 3 with a history of 3 consecutive normal Pap tests.  HPV screening.** / Every 3 years from ages 24 through ages 1 to 46 with a history of 3 consecutive normal Pap tests.  Hepatitis C blood test.** / For any individual with known risks for hepatitis C.  Skin self-exam. / Monthly.  Influenza vaccine. / Every year.  Tetanus, diphtheria, and acellular pertussis (Tdap, Td) vaccine.** / Consult your health care provider. Pregnant women should receive 1 dose of Tdap vaccine during each pregnancy. 1 dose of Td every 10 years.  Varicella vaccine.** / Consult your health care provider. Pregnant females who do not have evidence of immunity should receive the first dose after pregnancy.  HPV vaccine. / 3 doses over 6 months, if 72 and younger. The vaccine is not recommended for use in pregnant females. However, pregnancy testing is not needed before receiving a dose.  Measles, mumps, rubella (MMR) vaccine.** / You need at least 1 dose of MMR if you were born in 1957 or later. You may also need a 2nd dose. For females of childbearing age, rubella immunity should be determined. If there is no evidence of immunity, females who are not pregnant should be vaccinated. If there is no evidence of immunity, females who are pregnant should delay immunization until after pregnancy.  Pneumococcal 13-valent conjugate (PCV13) vaccine.** / Consult your health care provider.  Pneumococcal polysaccharide (PPSV23) vaccine.** / 1 to 2 doses if you smoke cigarettes or if you have certain conditions.  Meningococcal vaccine.** /  1 dose if you are age 19 to 21 years and a first-year college  student living in a residence hall, or have one of several medical conditions, you need to get vaccinated against meningococcal disease. You may also need additional booster doses.  Hepatitis A vaccine.** / Consult your health care provider.  Hepatitis B vaccine.** / Consult your health care provider.  Haemophilus influenzae type b (Hib) vaccine.** / Consult your health care provider. Ages 40 to 64 years  Blood pressure check.** / Every 1 to 2 years.  Lipid and cholesterol check.** / Every 5 years beginning at age 20 years.  Lung cancer screening. / Every year if you are aged 55-80 years and have a 30-pack-year history of smoking and currently smoke or have quit within the past 15 years. Yearly screening is stopped once you have quit smoking for at least 15 years or develop a health problem that would prevent you from having lung cancer treatment.  Clinical breast exam.** / Every year after age 40 years.  BRCA-related cancer risk assessment.** / For women who have family members with a BRCA-related cancer (breast, ovarian, tubal, or peritoneal cancers).  Mammogram.** / Every year beginning at age 40 years and continuing for as long as you are in good health. Consult with your health care provider.  Pap test.** / Every 3 years starting at age 30 years through age 65 or 70 years with a history of 3 consecutive normal Pap tests.  HPV screening.** / Every 3 years from ages 30 years through ages 65 to 70 years with a history of 3 consecutive normal Pap tests.  Fecal occult blood test (FOBT) of stool. / Every year beginning at age 50 years and continuing until age 75 years. You may not need to do this test if you get a colonoscopy every 10 years.  Flexible sigmoidoscopy or colonoscopy.** / Every 5 years for a flexible sigmoidoscopy or every 10 years for a colonoscopy beginning at age 50 years and continuing until age 75 years.  Hepatitis C blood test.** / For all people born from 1945 through  1965 and any individual with known risks for hepatitis C.  Skin self-exam. / Monthly.  Influenza vaccine. / Every year.  Tetanus, diphtheria, and acellular pertussis (Tdap/Td) vaccine.** / Consult your health care provider. Pregnant women should receive 1 dose of Tdap vaccine during each pregnancy. 1 dose of Td every 10 years.  Varicella vaccine.** / Consult your health care provider. Pregnant females who do not have evidence of immunity should receive the first dose after pregnancy.  Zoster vaccine.** / 1 dose for adults aged 60 years or older.  Measles, mumps, rubella (MMR) vaccine.** / You need at least 1 dose of MMR if you were born in 1957 or later. You may also need a 2nd dose. For females of childbearing age, rubella immunity should be determined. If there is no evidence of immunity, females who are not pregnant should be vaccinated. If there is no evidence of immunity, females who are pregnant should delay immunization until after pregnancy.  Pneumococcal 13-valent conjugate (PCV13) vaccine.** / Consult your health care provider.  Pneumococcal polysaccharide (PPSV23) vaccine.** / 1 to 2 doses if you smoke cigarettes or if you have certain conditions.  Meningococcal vaccine.** / Consult your health care provider.  Hepatitis A vaccine.** / Consult your health care provider.  Hepatitis B vaccine.** / Consult your health care provider.  Haemophilus influenzae type b (Hib) vaccine.** / Consult your health care provider. Ages 65   years and over  Blood pressure check.** / Every 1 to 2 years.  Lipid and cholesterol check.** / Every 5 years beginning at age 22 years.  Lung cancer screening. / Every year if you are aged 73-80 years and have a 30-pack-year history of smoking and currently smoke or have quit within the past 15 years. Yearly screening is stopped once you have quit smoking for at least 15 years or develop a health problem that would prevent you from having lung cancer  treatment.  Clinical breast exam.** / Every year after age 4 years.  BRCA-related cancer risk assessment.** / For women who have family members with a BRCA-related cancer (breast, ovarian, tubal, or peritoneal cancers).  Mammogram.** / Every year beginning at age 40 years and continuing for as long as you are in good health. Consult with your health care provider.  Pap test.** / Every 3 years starting at age 9 years through age 34 or 91 years with 3 consecutive normal Pap tests. Testing can be stopped between 65 and 70 years with 3 consecutive normal Pap tests and no abnormal Pap or HPV tests in the past 10 years.  HPV screening.** / Every 3 years from ages 57 years through ages 64 or 45 years with a history of 3 consecutive normal Pap tests. Testing can be stopped between 65 and 70 years with 3 consecutive normal Pap tests and no abnormal Pap or HPV tests in the past 10 years.  Fecal occult blood test (FOBT) of stool. / Every year beginning at age 15 years and continuing until age 17 years. You may not need to do this test if you get a colonoscopy every 10 years.  Flexible sigmoidoscopy or colonoscopy.** / Every 5 years for a flexible sigmoidoscopy or every 10 years for a colonoscopy beginning at age 86 years and continuing until age 71 years.  Hepatitis C blood test.** / For all people born from [birth year] through 1966 and any individual with known risks for hepatitis C.  Osteoporosis screening.** / A one-time screening for women ages 83 years and over and women at risk for fractures or osteoporosis.  Skin self-exam. / Monthly.  Influenza vaccine. / Every year.  Tetanus, diphtheria, and acellular pertussis (Tdap/Td) vaccine.** / 1 dose of Td every 10 years.  Varicella vaccine.** / Consult your health care provider.  Zoster vaccine.** / 1 dose for adults aged 61 years or older.  Pneumococcal 13-valent conjugate (PCV13) vaccine.** / Consult your health care provider.  Pneumococcal  polysaccharide (PPSV23) vaccine.** / 1 dose for all adults aged 28 years and older.  Meningococcal vaccine.** / Consult your health care provider.  Hepatitis A vaccine.** / Consult your health care provider.  Hepatitis B vaccine.** / Consult your health care provider.  Haemophilus influenzae type b (Hib) vaccine.** / Consult your health care provider. ** Family history and personal history of risk and conditions may change your health care provider's recommendations. Document Released: 02/21/2001 Document Revised: 05/12/2013 Document Reviewed: 05/23/2010 [facility] Patient Information 2015 [place], [place]. This information is not intended to replace advice given to you by your health care provider. Make sure you discuss any questions you have with your health care provider.

## 2014-09-28 NOTE — Progress Notes (Signed)
Subjective:     Theresa [name] is a 59 y.o. female and is here for a comprehensive physical exam. The patient reports problems - depression/ anxiety. Her and her husband declared Bankruptcy last year and moved in with her daughter.. She has been under a lot of stress.    Social History   Social History  . Marital Status: Married    Spouse Name: N/A  . Number of Children: N/A  . Years of Education: N/A   Occupational History  . [employer]   Social History Main Topics  . Smoking status: Never Smoker   . Smokeless tobacco: Not on file  . Alcohol Use: Yes     Comment: occasionally  . Drug Use: No  . Sexual Activity: Yes   Other Topics Concern  . Not on file   Social History Narrative   Health Maintenance  Topic Date Due  . Hepatitis C Screening  1955-07-18  . INFLUENZA VACCINE  01/28/2015 (Originally 08/10/2014)  . ZOSTAVAX  05/25/2015 (Originally 04/05/2014)  . HIV Screening  05/25/2015 (Originally 04/04/1969)  . MAMMOGRAM  06/04/2015  . PAP SMEAR  06/03/2016  . COLONOSCOPY  01/10/2020  . TETANUS/TDAP  06/04/2023    The following portions of the patient's history were reviewed and updated as appropriate:  She  has a past medical history of Hypertension; Breast cancer; Pleurisy; Depression; GERD (gastroesophageal reflux disease); History of stomach ulcers; Migraines; Urinary incontinence; and History of blood transfusion. She  does not have any pertinent problems on file. She  has past surgical history that includes Cholecystectomy (1987); Appendectomy (1960); Breast biopsy (Left, 1985); Mastectomy (1985); Breast reconstruction (1986 and 2010); and LASIK (2012). Her family history includes Arthritis in her maternal grandmother; Breast cancer in her mother; Depression in her father and mother; Heart failure in her father. She  reports that she has never smoked. She does not have any smokeless tobacco history on file. She reports that she drinks  alcohol. She reports that she does not use illicit drugs. She has a current medication list which includes the following prescription(s): fluticasone and loratadine. Current Outpatient Prescriptions on File Prior to Visit  Medication Sig Dispense Refill  . fluticasone (FLONASE) 50 MCG/ACT nasal spray Place 2 sprays into both nostrils daily as needed.    . loratadine (CLARITIN) 10 MG tablet Take 10 mg by mouth daily as needed.     No current facility-administered medications on file prior to visit.   She is allergic to nitroglycerin..  Review of Systems Review of Systems  Constitutional: Negative for activity change, appetite change and fatigue.  HENT: Negative for hearing loss, congestion, tinnitus and ear discharge.  dentist q9mEyes: Negative for visual disturbance (see optho q1y -- vision corrected to 20/20 with glasses).  Respiratory: Negative for cough, chest tightness and shortness of breath.   Cardiovascular: Negative for chest pain, palpitations and leg swelling.  Gastrointestinal: Negative for abdominal pain, diarrhea, constipation and abdominal distention.  Genitourinary: Negative for urgency, frequency, decreased urine volume and difficulty urinating.  Musculoskeletal: Negative for back pain, arthralgias and gait problem.  Skin: Negative for color change, pallor and rash.  Neurological: Negative for dizziness, light-headedness, numbness and headaches.  Hematological: Negative for adenopathy. Does not bruise/bleed easily.  Psychiatric/Behavioral: Negative for suicidal ideas, confusion, sleep disturbance, self-injury, dysphoric mood, decreased concentration and agitation.      Objective:    BP 116/80 mmHg  Pulse 71  Temp(Src) 98 F (36.7 C) (Oral)  Ht _0  (  1.651 m)  Wt 214 lb 12.8 oz (97.433 kg)  BMI 35.74 kg/m2  SpO2 98% General appearance: alert, cooperative, appears stated age and no distress Head: Normocephalic, without obvious abnormality, atraumatic Eyes:  conjunctivae/corneas clear. PERRL, EOM's intact. Fundi benign. Ears: normal TM's and external ear canals both ears Nose: Nares normal. Septum midline. Mucosa normal. No drainage or sinus tenderness. Throat: lips, mucosa, and tongue normal; teeth and gums normal Neck: no adenopathy, no carotid bruit, no JVD, supple, symmetrical, trachea midline and thyroid not enlarged, symmetric, no tenderness/mass/nodules Back: symmetric, no curvature. ROM normal. No CVA tenderness. Lungs: clear to auscultation bilaterally Breasts: normal appearance, no masses or tenderness Heart: regular rate and rhythm, S1, S2 normal, no murmur, click, rub or gallop Abdomen: soft, non-tender; bowel sounds normal; no masses,  no organomegaly Pelvic: deferred Extremities: extremities normal, atraumatic, no cyanosis or edema Pulses: 2+ and symmetric Skin: Skin color, texture, turgor normal. No rashes or lesions Lymph nodes: Cervical, supraclavicular, and axillary nodes normal. Neurologic: Alert and oriented X 3, normal strength and tone. Normal symmetric reflexes. Normal coordination and gait Psych- no depression, no anxiety      Assessment:    Healthy female exam.       Plan:    ghm utd Check labs See After Visit Summary for Counseling Recommendations    1. Preventative health care   - Comp Met (CMET) - CBC with Differential/Platelet - Lipid panel - POCT urinalysis dipstick - TSH - Hepatitis C antibody - HIV antibody  2. Depression Start and f/u 1 month - Vortioxetine HBr (TRINTELLIX) 10 MG TABS; 1 po qd  Dispense: 30 tablet; Refill: 2  3. Insomnia   - traZODone (DESYREL) 50 MG tablet; Take 0.5-1 tablets (25-50 mg total) by mouth at bedtime as needed for sleep.  Dispense: 30 tablet; Refill: 3  4. Morbid obesity Pt has done well keeping the weight off she lost - phentermine (ADIPEX-P) 37.5 MG tablet; Take 1 tablet (37.5 mg total) by mouth daily before breakfast.  Dispense: 30 tablet; Refill: 0  5.  Breast cancer screening   - MM DIGITAL SCREENING BILATERAL; Future  6. Estrogen deficiency   - DG Bone Density; Future  7. Tick bite Occurred about 1 week ago--in groin No rash Pt requesting test even though it was explained to her that it usually takes 3 weeks to show up in the blood  - B. burgdorfi antibodies - [place] m[place] spotted fvr ab, IgM-blood  8. Need for shingles vaccine   - Varicella-zoster vaccine subcutaneous

## 2014-09-29 ENCOUNTER — Telehealth: Payer: Self-pay | Admitting: Family Medicine

## 2014-09-29 ENCOUNTER — Encounter: Payer: Self-pay | Admitting: *Deleted

## 2014-09-29 LAB — COMPREHENSIVE METABOLIC PANEL
ALBUMIN: 4.1 g/dL (ref 3.5–5.2)
ALK PHOS: 51 U/L (ref 39–117)
ALT: 16 U/L (ref 0–35)
AST: 15 U/L (ref 0–37)
BILIRUBIN TOTAL: 0.3 mg/dL (ref 0.2–1.2)
BUN: 22 mg/dL (ref 6–23)
CALCIUM: 9.1 mg/dL (ref 8.4–10.5)
CO2: 24 meq/L (ref 19–32)
CREATININE: 0.81 mg/dL (ref 0.40–1.20)
Chloride: 106 mEq/L (ref 96–112)
GFR: 76.53 mL/min (ref >60.00)
Glucose, Bld: 92 mg/dL (ref 70–99)
Potassium: 3.9 mEq/L (ref 3.5–5.1)
Sodium: 140 mEq/L (ref 135–145)
TOTAL PROTEIN: 7.2 g/dL (ref 6.0–8.3)

## 2014-09-29 LAB — CBC WITH DIFFERENTIAL/PLATELET
BASOS ABS: 0 10*3/uL (ref 0.0–0.1)
Basophils Relative: 0.6 % (ref 0.0–3.0)
EOS ABS: 0.1 10*3/uL (ref 0.0–0.7)
Eosinophils Relative: 2.5 % (ref 0.0–5.0)
HEMATOCRIT: 39.9 % (ref 36.0–46.0)
HEMOGLOBIN: 13.2 g/dL (ref 12.0–15.0)
LYMPHS PCT: 38.1 % (ref 12.0–46.0)
Lymphs Abs: 2.1 10*3/uL (ref 0.7–4.0)
MCHC: 33.1 g/dL (ref 30.0–36.0)
MCV: 84.1 fl (ref 78.0–100.0)
MONOS PCT: 7.6 % (ref 3.0–12.0)
Monocytes Absolute: 0.4 10*3/uL (ref 0.1–1.0)
Neutro Abs: 2.8 10*3/uL (ref 1.4–7.7)
Neutrophils Relative %: 51.2 % (ref 43.0–77.0)
PLATELETS: 247 10*3/uL (ref 150.0–400.0)
RBC: 4.74 Mil/uL (ref 3.87–5.11)
RDW: 15.2 % (ref 11.5–15.5)
WBC: 5.5 10*3/uL (ref 4.0–10.5)

## 2014-09-29 LAB — HIV ANTIBODY (ROUTINE TESTING W REFLEX): HIV: NONREACTIVE

## 2014-09-29 LAB — ROCKY MTN SPOTTED FVR AB, IGM-BLOOD: ROCKY MTN SPOTTED FEVER, IGM: 0.07 IV

## 2014-09-29 LAB — LIPID PANEL
CHOL/HDL RATIO: 3
Cholesterol: 185 mg/dL (ref 0–200)
HDL: 68 mg/dL (ref >39.00)
LDL Cholesterol: 100 mg/dL — ABNORMAL HIGH (ref 0–99)
NONHDL: 117.37
TRIGLYCERIDES: 85 mg/dL (ref 0.0–149.0)
VLDL: 17 mg/dL (ref 0.0–40.0)

## 2014-09-29 LAB — LYME AB/WESTERN BLOT REFLEX: B BURGDORFERI AB IGG+ IGM: 0.44 {ISR}

## 2014-09-29 LAB — HEPATITIS C ANTIBODY: HCV Ab: NEGATIVE

## 2014-09-29 LAB — TSH: TSH: 1.5 u[IU]/mL (ref 0.35–4.50)

## 2014-09-29 MED ORDER — LORATADINE 10 MG PO TABS: 10.0000 mg | ORAL_TABLET | Freq: Every day | ORAL | Status: DC

## 2014-09-29 MED ORDER — MODAFINIL 200 MG PO TABS: 200.0000 mg | ORAL_TABLET | Freq: Every day | ORAL | Status: DC

## 2014-09-29 MED ORDER — BUPROPION HCL ER (XL) 150 MG PO TB24: ORAL_TABLET | ORAL | Status: DC

## 2014-09-29 NOTE — Telephone Encounter (Signed)
Please advise      KP 

## 2014-09-29 NOTE — Telephone Encounter (Signed)
She said she has never been on Prozac. It was Provigil and it was used to enhance the Wellbutrin. Please advise     KP

## 2014-09-29 NOTE — Telephone Encounter (Signed)
provigil is for narcolepsy or shift work not depression.  She told me she tried prozac in the past.  Is that what she meant We could do prozac again  10 mg #30  2 refills 1 po qd

## 2014-09-29 NOTE — Telephone Encounter (Signed)
provigil 200 mg 1 po qam #30  2 refills

## 2014-09-29 NOTE — Telephone Encounter (Signed)
trazadone was sent to pharmacy for sleep Did she use the coupon for the trinillex?

## 2014-09-29 NOTE — Telephone Encounter (Signed)
Caller name: Theresa [name]  Relationship to patient:Self  Can be reached: [phone] (work) (pt says to just ask for her and they will get her to the phone) Pharmacy:  Reason for call: pt called in stating that she never received her Rx for a sleep aide device, as discussed. ALSO, pt says that the anti-depressant that was prescribed is to expensive. She would like to know if there is an alternative.

## 2014-09-29 NOTE — Telephone Encounter (Signed)
The Trintillex is not covered by [facility] and the cost was 382 dollars. She said that she tried Provigil and Wellbutrin in the past and they worked well together for her and would like to try that again. She is also having issues with constipation and she would like an Rx for Miralax. Please advise      KP

## 2014-09-29 NOTE — Telephone Encounter (Signed)
I tried calling the patient to make her aware her med's are being faxed but the Vm is not setup.    KP

## 2014-10-01 NOTE — Telephone Encounter (Signed)
PA for modafinil initiated. Awaiting determination. JG//CMA

## 2014-10-01 NOTE — Telephone Encounter (Signed)
Pt called back in.  Informed her of notes below. Pt expressed understanding.   No call back is needed.

## 2014-10-19 NOTE — Telephone Encounter (Signed)
PA for modafinil 200 mg tabs resubmitted via telephone to [facility]. PA approved effective 09/28/14 - 10/19/15. JG//CMA

## 2014-11-23 ENCOUNTER — Telehealth: Payer: Self-pay | Admitting: Family Medicine

## 2014-11-23 ENCOUNTER — Other Ambulatory Visit: Payer: Self-pay | Admitting: Family Medicine

## 2014-11-23 DIAGNOSIS — M791 Myalgia, unspecified site: Secondary | ICD-10-CM

## 2014-11-23 DIAGNOSIS — F411 Generalized anxiety disorder: Secondary | ICD-10-CM

## 2014-11-23 MED ORDER — MELOXICAM 15 MG PO TABS: 15.0000 mg | ORAL_TABLET | Freq: Every day | ORAL | Status: DC

## 2014-11-23 MED ORDER — CYCLOBENZAPRINE HCL 10 MG PO TABS: 10.0000 mg | ORAL_TABLET | Freq: Three times a day (TID) | ORAL | Status: DC | PRN

## 2014-11-23 MED ORDER — ALPRAZOLAM 0.5 MG PO TABS
0.5000 mg | ORAL_TABLET | Freq: Every evening | ORAL | Status: DC | PRN
Start: 2014-11-23 — End: 2016-03-17

## 2014-11-23 NOTE — Telephone Encounter (Signed)
Will fax Xanax in the morning.      KP

## 2014-11-23 NOTE — Telephone Encounter (Signed)
Please advise      KP 

## 2014-11-23 NOTE — Telephone Encounter (Signed)
Can be reached:  Before 2:30pm call work #, After 2:30pm call home # Pharmacy:[id number] - [place], [place] - [address]  Reason for call: Pt asked for refill on cyclobenzaprine 10mg  and meloxicam 15mg  for fibromyalgia pain. Pt also asked if we can send in Xanax. She said with the Trazadone she is up every hour.

## 2014-11-23 NOTE — Telephone Encounter (Signed)
printed

## 2015-11-03 ENCOUNTER — Encounter: Payer: Self-pay | Admitting: Family Medicine

## 2015-11-03 ENCOUNTER — Telehealth: Payer: Self-pay | Admitting: Family Medicine

## 2015-11-03 NOTE — Telephone Encounter (Signed)
Relation to [id number] Call back [phone]    Reason for call:  Patient would like to discuss upcoming  ADHD appointment and didn't want to elaborate and asked for Kim directly, please advise

## 2015-11-04 NOTE — Telephone Encounter (Signed)
Spoke with patient and she wanted to know if we could do the ADD testing, I made her aware we could and she verbalized understanding, she did not have any other questions or concerns.    KP

## 2015-11-09 ENCOUNTER — Encounter: Payer: Self-pay | Admitting: Family Medicine

## 2015-11-09 ENCOUNTER — Ambulatory Visit (INDEPENDENT_AMBULATORY_CARE_PROVIDER_SITE_OTHER): Payer: BC Managed Care – PPO | Admitting: Family Medicine

## 2015-11-09 VITALS — BP 148/98 | HR 81 | Temp 97.9°F | Resp 16 | Ht 65.0 in | Wt 226.4 lb

## 2015-11-09 DIAGNOSIS — F988 Other specified behavioral and emotional disorders with onset usually occurring in childhood and adolescence: Secondary | ICD-10-CM

## 2015-11-09 MED ORDER — LISDEXAMFETAMINE DIMESYLATE 30 MG PO CAPS
30.0000 mg | ORAL_CAPSULE | Freq: Every day | ORAL | 0 refills | Status: DC
Start: 2015-11-09 — End: 2015-11-30

## 2015-11-09 NOTE — Patient Instructions (Signed)

## 2015-11-09 NOTE — Progress Notes (Signed)
Pre visit review using our clinic review tool, if applicable. No additional management support is needed unless otherwise documented below in the visit note. 

## 2015-11-09 NOTE — Progress Notes (Signed)
Patient ID: Theresa [name], female    DOB: Apr 11, 1955  Age: 60 y.o. MRN: [id number]    Subjective:  Subjective  HPI Theresa [name] presents for evaluation for ADD.  Her daughter and her son were both diagnosed with ADD and she is struggling with this.  She has learned to compensate for a lot of things but still having trouble with concentration and remembering things.  Looking back it took her hours to get her homework done and she struggling with remembering things she learned in the classroom  Review of Systems  Constitutional: Negative for appetite change, diaphoresis, fatigue and unexpected weight change.  Eyes: Negative for pain, redness and visual disturbance.  Respiratory: Negative for cough, chest tightness, shortness of breath and wheezing.   Cardiovascular: Negative for chest pain, palpitations and leg swelling.  Endocrine: Negative for cold intolerance, heat intolerance, polydipsia, polyphagia and polyuria.  Genitourinary: Negative for difficulty urinating, dysuria and frequency.  Neurological: Negative for dizziness, light-headedness, numbness and headaches.    History Past Medical History:  Diagnosis Date  . Breast cancer ([place])    Left breast  . Depression   . GERD (gastroesophageal reflux disease)   . History of blood transfusion   . History of stomach ulcers   . Hypertension   . Migraines   . Pleurisy   . Urinary incontinence     She has a past surgical history that includes Cholecystectomy (1987); Appendectomy (1960); Breast biopsy (Left, 1985); Mastectomy (1985); Breast reconstruction (1986 and 2010); and LASIK (2012).   Her family history includes Arthritis in her maternal grandmother; Breast cancer in her mother; Depression in her father and mother; Heart failure in her father.She reports that she has never smoked. She does not have any smokeless tobacco history on file. She reports that she drinks alcohol. She reports that she does not use drugs.  Current  Outpatient Prescriptions on File Prior to Visit  Medication Sig Dispense Refill  . ALPRAZolam (XANAX) 0.5 MG tablet Take 1 tablet (0.5 mg total) by mouth at bedtime as needed for anxiety. 30 tablet 0  . cyclobenzaprine (FLEXERIL) 10 MG tablet Take 1 tablet (10 mg total) by mouth 3 (three) times daily as needed for muscle spasms. 30 tablet 0  . fluticasone (FLONASE) 50 MCG/ACT nasal spray Place 2 sprays into both nostrils daily as needed.    . loratadine (CLARITIN) 10 MG tablet Take 1 tablet (10 mg total) by mouth daily. 90 tablet 3  . meloxicam (MOBIC) 15 MG tablet TAKE 1 TABLET BY MOUTH DAILY 90 tablet 0  . modafinil (PROVIGIL) 200 MG tablet Take 1 tablet (200 mg total) by mouth daily. 30 tablet 2  . phentermine (ADIPEX-P) 37.5 MG tablet Take 1 tablet (37.5 mg total) by mouth daily before breakfast. 30 tablet 0  . Vortioxetine HBr (TRINTELLIX) 10 MG TABS 1 po qd 30 tablet 2  . buPROPion (WELLBUTRIN XL) 150 MG 24 hr tablet Take 1 tab by mouth daily for one week and then increase to 2 daily (Patient not taking: Reported on 11/09/2015) 60 tablet 2   No current facility-administered medications on file prior to visit.      Objective:  Objective  Physical Exam  Constitutional: She is oriented to person, place, and time. She appears well-developed and well-nourished.  HENT:  Head: Normocephalic and atraumatic.  Eyes: Conjunctivae and EOM are normal.  Neck: Normal range of motion. Neck supple. No JVD present. Carotid bruit is not present. No thyromegaly present.  Cardiovascular: Normal  rate, regular rhythm and normal heart sounds.   No murmur heard. Pulmonary/Chest: Effort normal and breath sounds normal. No respiratory distress. She has no wheezes. She has no rales. She exhibits no tenderness.  Musculoskeletal: She exhibits no edema.  Neurological: She is alert and oriented to person, place, and time.  Psychiatric: She has a normal mood and affect. Her behavior is normal. Judgment and thought  content normal.  Nursing note and vitals reviewed.  BP (!) 148/98 (BP Location: Left Arm, Patient Position: Sitting, Cuff Size: Large)   Pulse 81   Temp 97.9 F (36.6 C) (Oral)   Resp 16   Ht 5\' 5"  (1.651 m)   Wt 226 lb 6.4 oz (102.7 kg)   SpO2 96%   BMI 37.67 kg/m  Wt Readings from Last 3 Encounters:  11/09/15 226 lb 6.4 oz (102.7 kg)  09/28/14 214 lb 12.8 oz (97.4 kg)  05/25/14 218 lb (98.9 kg)     Lab Results  Component Value Date   WBC 5.5 09/28/2014   HGB 13.2 09/28/2014   HCT 39.9 09/28/2014   PLT 247.0 09/28/2014   GLUCOSE 92 09/28/2014   CHOL 185 09/28/2014   TRIG 85.0 09/28/2014   HDL 68.00 09/28/2014   LDLCALC 100 (H) 09/28/2014   ALT 16 09/28/2014   AST 15 09/28/2014   NA 140 09/28/2014   K 3.9 09/28/2014   CL 106 09/28/2014   CREATININE 0.81 09/28/2014   BUN 22 09/28/2014   CO2 24 09/28/2014   TSH 1.50 09/28/2014    Mr Brain W Wo Contrast  Result Date: 04/10/2014 CLINICAL DATA:  Severe frontal headaches.  History breast cancer. BUN and creatinine were obtained on site at [facility] at [address] Results:  BUN 23 mg/dL,  Creatinine 0.9 mg/dL. EXAM: MRI HEAD WITHOUT AND WITH CONTRAST TECHNIQUE: Multiplanar, multiecho pulse sequences of the brain and surrounding structures were obtained without and with intravenous contrast. CONTRAST:  20 mL MultiHance IV COMPARISON:  None. FINDINGS: Ventricle size is normal. Cerebral volume is normal. Pituitary normal in size. Craniocervical junction normal. Calvarium intact. Negative for acute or chronic ischemia Negative for demyelinating disease. Cerebral white matter normal. Brainstem and basal ganglia normal. Negative for intracranial hemorrhage Negative for mass or edema Normal enhancement following contrast administration. No enhancing mass lesion. Leptomeningeal enhancement is normal. Paranasal sinuses are clear.  Bilateral mastoid sinus effusion. IMPRESSION: Normal MRI of the brain with contrast  Bilateral mastoid sinus effusion. Electronically Signed   By: Franchot Gallo M.D.   On: 04/10/2014 10:19    ADD adult quest-- + ADD Assessment & Plan:  Plan  I am having Theresa [name] start on lisdexamfetamine. I am also having her maintain her fluticasone, vortioxetine HBr, phentermine, loratadine, modafinil, buPROPion, ALPRAZolam, meloxicam, and cyclobenzaprine.  Meds ordered this encounter  Medications  . lisdexamfetamine (VYVANSE) 30 MG capsule    Sig: Take 1 capsule (30 mg total) by mouth daily.    Dispense:  30 capsule    Refill:  0    Problem List Items Addressed This Visit    None    Visit Diagnoses    Attention deficit disorder (ADD) without hyperactivity    -  Primary   Relevant Medications   lisdexamfetamine (VYVANSE) 30 MG capsule         Call or rto with an questions / concerns-pt had already found ways to work with the ADD and get things done.  It just got to be overwhelming Pt aware of  possible side effects  Follow-up: Return in about 4 weeks (around 12/07/2015), or if symptoms worsen or fail to improve, for add.  Ann Held, [name]

## 2015-11-12 ENCOUNTER — Telehealth: Payer: Self-pay

## 2015-11-12 NOTE — Telephone Encounter (Signed)
Awaiting response. LB

## 2015-11-23 NOTE — Telephone Encounter (Signed)
Medication for Vyvanse has been approved pt has been contacted. Message left on vmail. PC

## 2015-11-29 ENCOUNTER — Telehealth: Payer: Self-pay | Admitting: Family Medicine

## 2015-11-29 DIAGNOSIS — F988 Other specified behavioral and emotional disorders with onset usually occurring in childhood and adolescence: Secondary | ICD-10-CM

## 2015-11-29 NOTE — Telephone Encounter (Signed)
Per Ricardo Jericho. Patient called wanting to get a medication refill appointment. She works so needed the 6:15 slot. I informed patient that those are for acute issues unless provider approved. Patient is scheduled for Jan 4th at 4:15 for medication follow up for Vyvanse patient requesting refill to last her until then. She has two weeks left in her bottle. Please advise

## 2015-11-29 NOTE — Telephone Encounter (Signed)
Ok to refill 2 months

## 2015-11-29 NOTE — Telephone Encounter (Signed)
Patient called wanting to get a medication refill appointment. She works so needed the 6:15 slot. I informed patient that those are for acute issues unless provider approved. Patient is scheduled for Jan 4th at 4:15 for medication follow up for Vyvanse patient requesting refill to last her until then. She has two weeks left in her bottle. Please advise

## 2015-11-30 MED ORDER — LISDEXAMFETAMINE DIMESYLATE 30 MG PO CAPS: 30.0000 mg | ORAL_CAPSULE | Freq: Every day | ORAL | 0 refills | Status: DC

## 2015-11-30 NOTE — Telephone Encounter (Addendum)
Attempted to contact patient and inform her Rx for Vyvanse is ready for pick up one phone disconnected and unable to [facility] for return call.  Will leave Rx up front. TL/CMA

## 2015-11-30 NOTE — Telephone Encounter (Signed)
Printed Rx Vyvanse, awaiting provider's signature. LB

## 2015-12-06 ENCOUNTER — Encounter: Payer: Self-pay | Admitting: Family Medicine

## 2015-12-06 NOTE — Telephone Encounter (Signed)
Duplicate note

## 2016-01-13 ENCOUNTER — Ambulatory Visit (INDEPENDENT_AMBULATORY_CARE_PROVIDER_SITE_OTHER): Payer: BC Managed Care – PPO | Admitting: Family Medicine

## 2016-01-13 ENCOUNTER — Encounter: Payer: Self-pay | Admitting: Family Medicine

## 2016-01-13 DIAGNOSIS — F988 Other specified behavioral and emotional disorders with onset usually occurring in childhood and adolescence: Secondary | ICD-10-CM

## 2016-01-13 MED ORDER — LISDEXAMFETAMINE DIMESYLATE 30 MG PO CAPS: 30.0000 mg | ORAL_CAPSULE | Freq: Every day | ORAL | 0 refills | Status: DC

## 2016-01-13 NOTE — Patient Instructions (Signed)
Attention Deficit Hyperactivity Disorder, Attention deficit hyperactivity disorder (ADHD) is a condition that can make it hard for a child to pay attention and concentrate or to control his or her behavior. The child may also have a lot of energy. ADHD is a disorder of the brain (neurodevelopmental disorder), and symptoms are typically first seen in early childhood. It is a common reason for behavioral and academic problems in school. There are three main types of ADHD:  Inattentive. With this type, children have difficulty paying attention.  Hyperactive-impulsive. With this type, children have a lot of energy and have difficulty controlling their behavior.  Combination. This type involves having symptoms of both of the other types. ADHD is a lifelong condition. If it is not treated, the disorder can affect a child's future academic achievement, employment, and relationships. What are the causes? The exact cause of this condition is not known. What increases the risk? This condition is more likely to develop in:  Children who have a first-degree relative, such as a parent or brother or sister, with the condition.  Children who had a low birth weight.  Children whose mothers had problems during pregnancy or used alcohol or tobacco during pregnancy.  Children who have had a brain infection or a head injury.  Children who have been exposed to lead. What are the signs or symptoms? Symptoms of this condition depend on the type of ADHD. Symptoms are listed here for each type: Inattentive  Problems with organization.  Difficulty staying focused.  Problems completing assignments at school.  Often making simple mistakes.  Problems sustaining mental effort.  Not listening to instructions.  Losing things often.  Forgetting things often.  Being easily distracted. Hyperactive-impulsive  Fidgeting often.  Difficulty sitting still in one's seat.  Talking a lot.  Talking out of  turn.  Interrupting others.  Difficulty relaxing or doing quiet activities.  High energy levels and constant movement.  Difficulty waiting.  Always "on the go." Combination  Having symptoms of both of the other types. Children with ADHD may feel frustrated with themselves and may find school to be particularly discouraging. They often perform below their abilities in school. As children get older, the excess movement can lessen, but the problems with paying attention and staying organized often continue. Most children do not outgrow ADHD, but with good treatment, they can learn to cope with the symptoms. How is this diagnosed? This condition is diagnosed based on a child's symptoms and academic history. The child's health care provider will do a complete assessment. As part of the assessment, the health care provider will ask the child questions and will ask the parents and teachers for their observations of the child. The health care provider looks for specific symptoms of ADHD. Diagnosis will include:  Ruling out other reasons for the child's behavior.  Reviewing behavior rating scales that have been filled out about the child by people who deal with the child on a daily basis. A diagnosis is made only after all information from multiple people has been considered. How is this treated? Treatment for this condition may include:  Behavior therapy.  Medicines to decrease impulsivity and hyperactivity and to increase attention. Behavior therapy is preferred for children younger than 28 years old. The combination of medicine and behavior therapy is most effective for children older than 43 years of age.  Tutoring or extra support at school.  Techniques for parents to use at home to help manage their child's symptoms and behavior. Follow these  instructions at home: Eating and drinking  Offer your child a well-balanced diet. Breakfast that includes a balance of whole grains, protein,  and fruits or vegetables is especially important for school performance.  If your child has trouble with hyperactivity, have your child avoid drinks that contain caffeine. These include:  Soft drinks.  Coffee.  Tea.  If your child is older and finds that caffeinated drinks help to improve his or her attention, talk with your child's health care provider about what amount of caffeine intake is a safe for your child. Lifestyle  Make sure your child gets a full night of sleep and regular daily exercise.  Help manage your child's behavior by following the techniques learned in therapy. These may include:  Looking for good behavior and rewarding it.  Making rules for behavior that your child can understand and follow.  Giving clear instructions.  Responding consistently to your child's challenging behaviors.  Setting realistic goals.  Looking for activities that can lead to success and self-esteem.  Making time for pleasant activities with your child.  Giving lots of affection.  Help your child learn to be organized. Some ways to do this include:  Keeping daily schedules the same. Have a regular wake-up time and bedtime for your child. Schedule all activities, including time for homework and time for play. Post the schedule in a place where your child will see it. Mark schedule changes in advance.  Having a regular place for your child to store items such as clothing, backpacks, and school supplies.  Encouraging your child to write down school assignments and to bring home needed books. Work with your child's teachers for assistance in organizing school work. General instructions  Learn as much as you can about ADHD. This will improve your ability to help your child and to make sure he or she gets the support needed. It will also help you educate your child's teachers and instructors if they do not feel that they have adequate knowledge or experience in these areas.  Work with  your child's teachers to make sure your child gets the support and extra help that is needed. This may include:  Tutoring.  Teacher cues to help your child remain on task.  Seating changes so your child is working at a desk that is free from distractions.  Give over-the-counter and prescription medicines only as told by your child's health care provider.  Keep all follow-up visits as told by your health care provider. This is important. Contact a health care provider if:  Your child has repeated muscle twitches (tics), coughs, or speech outbursts.  Your child has sleep problems.  Your child has a marked loss of appetite.  Your child develops depression.  Your child has new or worsening behavioral problems.  Your child has dizziness.  Your child has a racing heart.  Your child has stomach pains.  Your child develops headaches. Get help right away if:  Your child talks about or threatens suicide.  You are worried that your child is having a bad reaction to a medicine that he or she is taking for ADHD. This information is not intended to replace advice given to you by your health care provider. Make sure you discuss any questions you have with your health care provider. Document Released: 12/16/2001 Document Revised: 08/25/2015 Document Reviewed: 07/22/2015 Elsevier Interactive Patient Education  2017 [employer].

## 2016-01-13 NOTE — Progress Notes (Signed)
Patient ID: Theresa [name], female    DOB: 02-16-1955  Age: 61 y.o. MRN: [id number]    Subjective:  Subjective  HPI Theresa [name] presents for add f/u.  meds working well.  No complaints.  Review of Systems  Constitutional: Negative for activity change, appetite change, diaphoresis, fatigue and unexpected weight change.  Eyes: Negative for pain, redness and visual disturbance.  Respiratory: Negative for cough, chest tightness, shortness of breath and wheezing.   Cardiovascular: Negative for chest pain, palpitations and leg swelling.  Endocrine: Negative for cold intolerance, heat intolerance, polydipsia, polyphagia and polyuria.  Genitourinary: Negative for difficulty urinating, dysuria and frequency.  Neurological: Negative for dizziness, light-headedness, numbness and headaches.  Psychiatric/Behavioral: Negative for behavioral problems and dysphoric mood. The patient is not nervous/anxious.     History Past Medical History:  Diagnosis Date  . Breast cancer ([place])    Left breast  . Depression   . GERD (gastroesophageal reflux disease)   . History of blood transfusion   . History of stomach ulcers   . Hypertension   . Migraines   . Pleurisy   . Urinary incontinence     She has a past surgical history that includes Cholecystectomy (1987); Appendectomy (1960); Breast biopsy (Left, 1985); Mastectomy (1985); Breast reconstruction (1986 and 2010); and LASIK (2012).   Her family history includes Arthritis in her maternal grandmother; Breast cancer in her mother; Depression in her father and mother; Heart failure in her father.She reports that she has never smoked. She does not have any smokeless tobacco history on file. She reports that she drinks alcohol. She reports that she does not use drugs.  Current Outpatient Prescriptions on File Prior to Visit  Medication Sig Dispense Refill  . ALPRAZolam (XANAX) 0.5 MG tablet Take 1 tablet (0.5 mg total) by mouth at bedtime as needed for  anxiety. 30 tablet 0  . cyclobenzaprine (FLEXERIL) 10 MG tablet Take 1 tablet (10 mg total) by mouth 3 (three) times daily as needed for muscle spasms. 30 tablet 0  . fluticasone (FLONASE) 50 MCG/ACT nasal spray Place 2 sprays into both nostrils daily as needed.    . loratadine (CLARITIN) 10 MG tablet Take 1 tablet (10 mg total) by mouth daily. 90 tablet 3  . meloxicam (MOBIC) 15 MG tablet TAKE 1 TABLET BY MOUTH DAILY 90 tablet 0  . buPROPion (WELLBUTRIN XL) 150 MG 24 hr tablet Take 1 tab by mouth daily for one week and then increase to 2 daily (Patient not taking: Reported on 01/13/2016) 60 tablet 2  . modafinil (PROVIGIL) 200 MG tablet Take 1 tablet (200 mg total) by mouth daily. (Patient not taking: Reported on 01/13/2016) 30 tablet 2  . phentermine (ADIPEX-P) 37.5 MG tablet Take 1 tablet (37.5 mg total) by mouth daily before breakfast. (Patient not taking: Reported on 01/13/2016) 30 tablet 0  . Vortioxetine HBr (TRINTELLIX) 10 MG TABS 1 po qd (Patient not taking: Reported on 01/13/2016) 30 tablet 2   No current facility-administered medications on file prior to visit.      Objective:  Objective  Physical Exam  Constitutional: She is oriented to person, place, and time. She appears well-developed and well-nourished.  HENT:  Head: Normocephalic and atraumatic.  Eyes: Conjunctivae and EOM are normal.  Neck: Normal range of motion. Neck supple. No JVD present. Carotid bruit is not present. No thyromegaly present.  Cardiovascular: Normal rate, regular rhythm and normal heart sounds.   No murmur heard. Pulmonary/Chest: Effort normal and breath sounds normal.  No respiratory distress. She has no wheezes. She has no rales. She exhibits no tenderness.  Musculoskeletal: She exhibits no edema.  Neurological: She is alert and oriented to person, place, and time.  Psychiatric: She has a normal mood and affect.  Nursing note and vitals reviewed.  BP 132/82 (BP Location: Left Arm, Patient Position: Sitting,  Cuff Size: Large)   Pulse 77   Temp 97.5 F (36.4 C) (Oral)   Resp 16   Ht 5\' 5"  (1.651 m)   Wt 226 lb 3.2 oz (102.6 kg)   SpO2 95%   BMI 37.64 kg/m  Wt Readings from Last 3 Encounters:  01/13/16 226 lb 3.2 oz (102.6 kg)  11/09/15 226 lb 6.4 oz (102.7 kg)  09/28/14 214 lb 12.8 oz (97.4 kg)     Lab Results  Component Value Date   WBC 5.5 09/28/2014   HGB 13.2 09/28/2014   HCT 39.9 09/28/2014   PLT 247.0 09/28/2014   GLUCOSE 92 09/28/2014   CHOL 185 09/28/2014   TRIG 85.0 09/28/2014   HDL 68.00 09/28/2014   LDLCALC 100 (H) 09/28/2014   ALT 16 09/28/2014   AST 15 09/28/2014   NA 140 09/28/2014   K 3.9 09/28/2014   CL 106 09/28/2014   CREATININE 0.81 09/28/2014   BUN 22 09/28/2014   CO2 24 09/28/2014   TSH 1.50 09/28/2014    Mr Brain W Wo Contrast  Result Date: 04/10/2014 CLINICAL DATA:  Severe frontal headaches.  History breast cancer. BUN and creatinine were obtained on site at [facility] at [address] Results:  BUN 23 mg/dL,  Creatinine 0.9 mg/dL. EXAM: MRI HEAD WITHOUT AND WITH CONTRAST TECHNIQUE: Multiplanar, multiecho pulse sequences of the brain and surrounding structures were obtained without and with intravenous contrast. CONTRAST:  20 mL MultiHance IV COMPARISON:  None. FINDINGS: Ventricle size is normal. Cerebral volume is normal. Pituitary normal in size. Craniocervical junction normal. Calvarium intact. Negative for acute or chronic ischemia Negative for demyelinating disease. Cerebral white matter normal. Brainstem and basal ganglia normal. Negative for intracranial hemorrhage Negative for mass or edema Normal enhancement following contrast administration. No enhancing mass lesion. Leptomeningeal enhancement is normal. Paranasal sinuses are clear.  Bilateral mastoid sinus effusion. IMPRESSION: Normal MRI of the brain with contrast Bilateral mastoid sinus effusion. Electronically Signed   By: Franchot Gallo M.D.   On: 04/10/2014 10:19       Assessment & Plan:  Plan  I am having Ms. Dowen start on lisdexamfetamine and lisdexamfetamine. I am also having her maintain her fluticasone, vortioxetine HBr, phentermine, loratadine, modafinil, buPROPion, ALPRAZolam, meloxicam, cyclobenzaprine, and lisdexamfetamine.  Meds ordered this encounter  Medications  . lisdexamfetamine (VYVANSE) 30 MG capsule    Sig: Take 1 capsule (30 mg total) by mouth daily.    Dispense:  30 capsule    Refill:  0  . lisdexamfetamine (VYVANSE) 30 MG capsule    Sig: Take 1 capsule (30 mg total) by mouth daily.    Dispense:  30 capsule    Refill:  0    [name] not fill until Feb 2018  . lisdexamfetamine (VYVANSE) 30 MG capsule    Sig: Take 1 capsule (30 mg total) by mouth daily.    Dispense:  30 capsule    Refill:  0    [name] not fill until March 2018    Problem List Items Addressed This Visit    None    Visit Diagnoses    Attention deficit disorder (ADD) without  hyperactivity       Relevant Medications   lisdexamfetamine (VYVANSE) 30 MG capsule   lisdexamfetamine (VYVANSE) 30 MG capsule   lisdexamfetamine (VYVANSE) 30 MG capsule    stable--- con't meds Contract signed rto 6 months or sooner prn  Follow-up: Return in about 6 months (around 07/12/2016) for f/u add.  Ann Held, [name]

## 2016-01-13 NOTE — Progress Notes (Signed)
Pre visit review using our clinic review tool, if applicable. No additional management support is needed unless otherwise documented below in the visit note. 

## 2016-01-20 ENCOUNTER — Encounter: Payer: Self-pay | Admitting: Family Medicine

## 2016-01-20 NOTE — Telephone Encounter (Signed)
Ok to increase to 40 mg --- please give her 3 new rx  Pt will need to bring in old rx

## 2016-01-25 ENCOUNTER — Encounter: Payer: Self-pay | Admitting: Family Medicine

## 2016-01-25 ENCOUNTER — Other Ambulatory Visit: Payer: Self-pay

## 2016-01-25 MED ORDER — LISDEXAMFETAMINE DIMESYLATE 40 MG PO CAPS: 40.0000 mg | ORAL_CAPSULE | ORAL | 0 refills | Status: DC

## 2016-01-25 NOTE — Telephone Encounter (Signed)
Relation to [id number] Call back number: [phone]   Reason for call:  Patient checking on the status of medication below, patient would like to pick up Rx today and will bring old Rx, please advise

## 2016-03-17 ENCOUNTER — Encounter: Payer: Self-pay | Admitting: Family Medicine

## 2016-03-17 ENCOUNTER — Ambulatory Visit (INDEPENDENT_AMBULATORY_CARE_PROVIDER_SITE_OTHER): Payer: BC Managed Care – PPO | Admitting: Family Medicine

## 2016-03-17 VITALS — BP 128/82 | HR 79 | Temp 98.0°F | Resp 16 | Ht 65.0 in | Wt 214.8 lb

## 2016-03-17 DIAGNOSIS — L723 Sebaceous cyst: Secondary | ICD-10-CM | POA: Diagnosis not present

## 2016-03-17 DIAGNOSIS — F411 Generalized anxiety disorder: Secondary | ICD-10-CM | POA: Diagnosis not present

## 2016-03-17 DIAGNOSIS — Z8669 Personal history of other diseases of the nervous system and sense organs: Secondary | ICD-10-CM

## 2016-03-17 MED ORDER — ALPRAZOLAM 0.5 MG PO TABS: 0.5000 mg | ORAL_TABLET | Freq: Every evening | ORAL | 0 refills | Status: DC | PRN

## 2016-03-17 MED ORDER — SUMATRIPTAN SUCCINATE 100 MG PO TABS: 100.0000 mg | ORAL_TABLET | ORAL | 0 refills | Status: DC | PRN

## 2016-03-17 NOTE — Progress Notes (Signed)
Subjective:  I acted as a Education administrator for Dr. Royden Purl, LPN    Patient ID: Theresa [name], female    DOB: Mar 10, 1955, 61 y.o.   MRN: [id number]  Chief Complaint  Patient presents with  . Migraine    Migraine   This is a chronic problem. The current episode started more than 1 year ago. The problem occurs intermittently. The problem has been unchanged. The pain is at a severity of 0/10. The patient is experiencing no pain. Pertinent negatives include no back pain, blurred vision, coughing, fever or vomiting.    Patient is in today for c/o migraines. Patient report she have a lump located at the groin on the right side and also a lump on the right side of her abdomen for about 2 year and patient report they're getting bigger.   Patient Care Team: Ann Held, [name] as PCP - General (Family Medicine)   Past Medical History:  Diagnosis Date  . Breast cancer ([place])    Left breast  . Depression   . GERD (gastroesophageal reflux disease)   . History of blood transfusion   . History of stomach ulcers   . Hypertension   . Migraines   . Pleurisy   . Urinary incontinence     Past Surgical History:  Procedure Laterality Date  . APPENDECTOMY  1960  . BREAST BIOPSY Left 1985  . BREAST RECONSTRUCTION  1986 and 2010   x's 2  . CHOLECYSTECTOMY  1987  . LASIK  2012  . MASTECTOMY  1985    Family History  Problem Relation Age of Onset  . Heart failure Father   . Arthritis Maternal Grandmother   . Breast cancer Mother   . Depression Mother   . Depression Father     Social History   Social History  . Marital status: Married    Spouse name: N/A  . Number of children: N/A  . Years of education: N/A   Occupational History  . [employer]   Social History Main Topics  . Smoking status: Never Smoker  . Smokeless tobacco: Never Used  . Alcohol use Yes     Comment: occasionally  . Drug use: No  . Sexual activity: Yes   Other Topics  Concern  . Not on file   Social History Narrative  . No narrative on file    Outpatient Medications Prior to Visit  Medication Sig Dispense Refill  . cyclobenzaprine (FLEXERIL) 10 MG tablet Take 1 tablet (10 mg total) by mouth 3 (three) times daily as needed for muscle spasms. 30 tablet 0  . fluticasone (FLONASE) 50 MCG/ACT nasal spray Place 2 sprays into both nostrils daily as needed.    Marland Kitchen lisdexamfetamine (VYVANSE) 40 MG capsule Take 1 capsule (40 mg total) by mouth every morning. 30 capsule 0  . lisdexamfetamine (VYVANSE) 40 MG capsule Take 1 capsule (40 mg total) by mouth every morning. 30 capsule 0  . lisdexamfetamine (VYVANSE) 40 MG capsule Take 1 capsule (40 mg total) by mouth every morning. 30 capsule 0  . loratadine (CLARITIN) 10 MG tablet Take 1 tablet (10 mg total) by mouth daily. 90 tablet 3  . meloxicam (MOBIC) 15 MG tablet TAKE 1 TABLET BY MOUTH DAILY 90 tablet 0  . ALPRAZolam (XANAX) 0.5 MG tablet Take 1 tablet (0.5 mg total) by mouth at bedtime as needed for anxiety. 30 tablet 0  . buPROPion (WELLBUTRIN XL) 150 MG 24 hr tablet Take 1  tab by mouth daily for one week and then increase to 2 daily 60 tablet 2  . modafinil (PROVIGIL) 200 MG tablet Take 1 tablet (200 mg total) by mouth daily. 30 tablet 2  . phentermine (ADIPEX-P) 37.5 MG tablet Take 1 tablet (37.5 mg total) by mouth daily before breakfast. 30 tablet 0  . Vortioxetine HBr (TRINTELLIX) 10 MG TABS 1 po qd 30 tablet 2   No facility-administered medications prior to visit.     Allergies  Allergen Reactions  . Nitroglycerin     Migraine    Review of Systems  Constitutional: Negative for fever.  HENT: Negative for congestion.   Eyes: Negative for blurred vision.  Respiratory: Negative for cough.   Cardiovascular: Negative for chest pain and palpitations.  Gastrointestinal: Negative for vomiting.  Musculoskeletal: Negative for back pain.  Skin: Negative for rash.  Neurological: Negative for loss of  consciousness and headaches.       Objective:    Physical Exam  Constitutional: She is oriented to person, place, and time. She appears well-developed and well-nourished. No distress.  HENT:  Head: Normocephalic and atraumatic.  Eyes: Conjunctivae are normal. Pupils are equal, round, and reactive to light.  Neck: Normal range of motion. No thyromegaly present.  Cardiovascular: Normal rate and regular rhythm.   Pulmonary/Chest: Effort normal and breath sounds normal. She has no wheezes.  Abdominal: Soft. Bowel sounds are normal. There is no tenderness.  Musculoskeletal: Normal range of motion. She exhibits no edema or deformity.  Neurological: She is alert and oriented to person, place, and time.  Skin: Skin is warm and dry. She is not diaphoretic.  Psychiatric: She has a normal mood and affect.    BP 128/82 (BP Location: Left Arm, Patient Position: Sitting, Cuff Size: Large)   Pulse 79   Temp 98 F (36.7 C) (Oral)   Resp 16   Ht 5\' 5"  (1.651 m)   Wt 214 lb 12.8 oz (97.4 kg)   SpO2 94%   BMI 35.74 kg/m  Wt Readings from Last 3 Encounters:  03/17/16 214 lb 12.8 oz (97.4 kg)  01/13/16 226 lb 3.2 oz (102.6 kg)  11/09/15 226 lb 6.4 oz (102.7 kg)     Lab Results  Component Value Date   WBC 5.5 09/28/2014   HGB 13.2 09/28/2014   HCT 39.9 09/28/2014   PLT 247.0 09/28/2014   GLUCOSE 92 09/28/2014   CHOL 185 09/28/2014   TRIG 85.0 09/28/2014   HDL 68.00 09/28/2014   LDLCALC 100 (H) 09/28/2014   ALT 16 09/28/2014   AST 15 09/28/2014   NA 140 09/28/2014   K 3.9 09/28/2014   CL 106 09/28/2014   CREATININE 0.81 09/28/2014   BUN 22 09/28/2014   CO2 24 09/28/2014   TSH 1.50 09/28/2014    Lab Results  Component Value Date   TSH 1.50 09/28/2014   Lab Results  Component Value Date   WBC 5.5 09/28/2014   HGB 13.2 09/28/2014   HCT 39.9 09/28/2014   MCV 84.1 09/28/2014   PLT 247.0 09/28/2014   Lab Results  Component Value Date   NA 140 09/28/2014   K 3.9  09/28/2014   CO2 24 09/28/2014   GLUCOSE 92 09/28/2014   BUN 22 09/28/2014   CREATININE 0.81 09/28/2014   BILITOT 0.3 09/28/2014   ALKPHOS 51 09/28/2014   AST 15 09/28/2014   ALT 16 09/28/2014   PROT 7.2 09/28/2014   ALBUMIN 4.1 09/28/2014   CALCIUM 9.1 09/28/2014  GFR 76.53 09/28/2014   Lab Results  Component Value Date   CHOL 185 09/28/2014   Lab Results  Component Value Date   HDL 68.00 09/28/2014   Lab Results  Component Value Date   LDLCALC 100 (H) 09/28/2014   Lab Results  Component Value Date   TRIG 85.0 09/28/2014   Lab Results  Component Value Date   CHOLHDL 3 09/28/2014   No results found for: HGBA1C     Assessment & Plan:   Problem List Items Addressed This Visit      Unprioritized   Sebaceous cyst    Pt to rto for I and D       Other Visit Diagnoses    Hx of migraines    -  Primary   Relevant Medications   SUMAtriptan (IMITREX) 100 MG tablet   Generalized anxiety disorder       Relevant Medications   ALPRAZolam (XANAX) 0.5 MG tablet      I have discontinued Ms. Schrade's vortioxetine HBr, phentermine, modafinil, and buPROPion. I am also having her start on SUMAtriptan. Additionally, I am having her maintain her fluticasone, loratadine, meloxicam, cyclobenzaprine, lisdexamfetamine, lisdexamfetamine, lisdexamfetamine, and ALPRAZolam.  Meds ordered this encounter  Medications  . SUMAtriptan (IMITREX) 100 MG tablet    Sig: Take 1 tablet (100 mg total) by mouth every 2 (two) hours as needed for migraine. May repeat in 2 hours if headache persists or recurs.    Dispense:  10 tablet    Refill:  0  . ALPRAZolam (XANAX) 0.5 MG tablet    Sig: Take 1 tablet (0.5 mg total) by mouth at bedtime as needed for anxiety.    Dispense:  30 tablet    Refill:  0    CMA served as scribe during this visit. History, Physical and Plan performed by medical provider. Documentation and orders reviewed and attested to.  Ann Held, [name]  Patient ID:  Theresa [name], female   DOB: Sep 20, 1955, 61 y.o.   MRN: [id number]

## 2016-03-17 NOTE — Progress Notes (Signed)
Pre visit review using our clinic review tool, if applicable. No additional management support is needed unless otherwise documented below in the visit note. 

## 2016-03-17 NOTE — Patient Instructions (Signed)
Migraine Headache A migraine headache is an intense, throbbing pain on one side or both sides of the head. Migraines may also cause other symptoms, such as nausea, vomiting, and sensitivity to light and noise. What are the causes? Doing or taking certain things may also trigger migraines, such as:  Alcohol.  Smoking.  Medicines, such as: ? Medicine used to treat chest pain (nitroglycerine). ? Birth control pills. ? Estrogen pills. ? Certain blood pressure medicines.  Aged cheeses, chocolate, or caffeine.  Foods or drinks that contain nitrates, glutamate, aspartame, or tyramine.  Physical activity.  Other things that may trigger a migraine include:  Menstruation.  Pregnancy.  Hunger.  Stress, lack of sleep, too much sleep, or fatigue.  Weather changes.  What increases the risk? The following factors may make you more likely to experience migraine headaches:  Age. Risk increases with age.  Family history of migraine headaches.  Being Caucasian.  Depression and anxiety.  Obesity.  Being a woman.  Having a hole in the heart (patent foramen ovale) or other heart problems.  What are the signs or symptoms? The main symptom of this condition is pulsating or throbbing pain. Pain may:  Happen in any area of the head, such as on one side or both sides.  Interfere with daily activities.  Get worse with physical activity.  Get worse with exposure to bright lights or loud noises.  Other symptoms may include:  Nausea.  Vomiting.  Dizziness.  General sensitivity to bright lights, loud noises, or smells.  Before you get a migraine, you may get warning signs that a migraine is developing (aura). An aura may include:  Seeing flashing lights or having blind spots.  Seeing bright spots, halos, or zigzag lines.  Having tunnel vision or blurred vision.  Having numbness or a tingling feeling.  Having trouble talking.  Having muscle weakness.  How is this  diagnosed? A migraine headache can be diagnosed based on:  Your symptoms.  A physical exam.  Tests, such as CT scan or MRI of the head. These imaging tests can help rule out other causes of headaches.  Taking fluid from the spine (lumbar puncture) and analyzing it (cerebrospinal fluid analysis, or CSF analysis).  How is this treated? A migraine headache is usually treated with medicines that:  Relieve pain.  Relieve nausea.  Prevent migraines from coming back.  Treatment may also include:  Acupuncture.  Lifestyle changes like avoiding foods that trigger migraines.  Follow these instructions at home: Medicines  Take over-the-counter and prescription medicines only as told by your health care provider.  Do not drive or use heavy machinery while taking prescription pain medicine.  To prevent or treat constipation while you are taking prescription pain medicine, your health care provider may recommend that you: ? Drink enough fluid to keep your urine clear or pale yellow. ? Take over-the-counter or prescription medicines. ? Eat foods that are high in fiber, such as fresh fruits and vegetables, whole grains, and beans. ? Limit foods that are high in fat and processed sugars, such as fried and sweet foods. Lifestyle  Avoid alcohol use.  Do not use any products that contain nicotine or tobacco, such as cigarettes and e-cigarettes. If you need help quitting, ask your health care provider.  Get at least 8 hours of sleep every night.  Limit your stress. General instructions   Keep a journal to find out what may trigger your migraine headaches. For example, write down: ? What you eat and   drink. ? How much sleep you get. ? Any change to your diet or medicines.  If you have a migraine: ? Avoid things that make your symptoms worse, such as bright lights. ? It may help to lie down in a dark, quiet room. ? Do not drive or use heavy machinery. ? Ask your health care provider  what activities are safe for you while you are experiencing symptoms.  Keep all follow-up visits as told by your health care provider. This is important. Contact a health care provider if:  You develop symptoms that are different or more severe than your usual migraine symptoms. Get help right away if:  Your migraine becomes severe.  You have a fever.  You have a stiff neck.  You have vision loss.  Your muscles feel weak or like you cannot control them.  You start to lose your balance often.  You develop trouble walking.  You faint. This information is not intended to replace advice given to you by your health care provider. Make sure you discuss any questions you have with your health care provider. Document Released: 12/26/2004 Document Revised: 07/16/2015 Document Reviewed: 06/14/2015 Elsevier Interactive Patient Education  2017 Else[employer] Inc.   

## 2016-03-19 DIAGNOSIS — L723 Sebaceous cyst: Secondary | ICD-10-CM | POA: Insufficient documentation

## 2016-03-19 NOTE — Assessment & Plan Note (Signed)
Pt to rto for I and D

## 2016-04-10 ENCOUNTER — Ambulatory Visit (INDEPENDENT_AMBULATORY_CARE_PROVIDER_SITE_OTHER): Payer: BC Managed Care – PPO | Admitting: Family Medicine

## 2016-04-10 ENCOUNTER — Encounter: Payer: Self-pay | Admitting: Family Medicine

## 2016-04-10 VITALS — BP 128/82 | HR 82 | Temp 97.8°F | Resp 16 | Ht 65.0 in | Wt 211.4 lb

## 2016-04-10 DIAGNOSIS — G43809 Other migraine, not intractable, without status migrainosus: Secondary | ICD-10-CM | POA: Diagnosis not present

## 2016-04-10 DIAGNOSIS — L723 Sebaceous cyst: Secondary | ICD-10-CM

## 2016-04-10 MED ORDER — ZOLMITRIPTAN 5 MG PO TABS: 5.0000 mg | ORAL_TABLET | ORAL | 0 refills | Status: DC | PRN

## 2016-04-10 NOTE — Patient Instructions (Signed)
Epidermal Cyst Removal, Care After Refer to this sheet in the next few weeks. These instructions provide you with information about caring for yourself after your procedure. Your health care provider may also give you more specific instructions. Your treatment has been planned according to current medical practices, but problems sometimes occur. Call your health care provider if you have any problems or questions after your procedure. What can I expect after the procedure? After the procedure, it is common to have:  Soreness in the area where your cyst was removed.  Tightness or itching from your skin sutures. Follow these instructions at home:  Take medicines only as directed by your health care provider.  If you were prescribed an antibiotic medicine, finish all of it even if you start to feel better.  Use antibiotic ointment as directed by your health care provider. Follow the instructions carefully.  There are many different ways to close and cover an incision, including stitches (sutures), skin glue, and adhesive strips. Follow your health care provider's instructions about:  Incision care.  Bandage (dressing) changes and removal.  Incision closure removal.  Keep the bandage (dressing) dry until your health care provider says that it can be removed. Take sponge baths only. Ask your health care provider when you can start showering or taking a bath.  After your dressing is off, check your incision every day for signs of infection. Watch for:  Redness, swelling, or pain.  Fluid, blood, or pus.  You can return to your normal activities. Do not do anything that stretches or puts pressure on your incision.  You can return to your normal diet.  Keep all follow-up visits as directed by your health care provider. This is important. Contact a health care provider if:  You have a fever.  Your incision bleeds.  You have redness, swelling, or pain in the incision area.  You have  fluid, blood, or pus coming from your incision.  Your cyst comes back after surgery. This information is not intended to replace advice given to you by your health care provider. Make sure you discuss any questions you have with your health care provider. Document Released: 01/16/2014 Document Revised: 06/03/2015 Document Reviewed: 09/10/2013 Elsevier Interactive Patient Education  2017 [employer].

## 2016-04-10 NOTE — Progress Notes (Signed)
Pre visit review using our clinic review tool, if applicable. No additional management support is needed unless otherwise documented below in the visit note. 

## 2016-04-10 NOTE — Assessment & Plan Note (Signed)
See procedure note rto 4-5 days for suture removal

## 2016-04-10 NOTE — Progress Notes (Signed)
Subjective:  I acted as a Education administrator for Dr. Royden Purl, LPN    Patient ID: Theresa [name], female    DOB: 1955-08-27, 61 y.o.   MRN: [id number]  Chief Complaint  Patient presents with  . Cyst    HPI  Patient is in today for in 2 ingrown hair removal, right side of abdomen, and the right inner thigh near groin. Patient report she is not taking Imitrex, Pt report she does not take this Imitrex because it makes her head burn inside, and make the migraines worst.  Patient Care Team: Ann Held, [name] as PCP - General (Family Medicine)   Past Medical History:  Diagnosis Date  . Breast cancer ([place])    Left breast  . Depression   . GERD (gastroesophageal reflux disease)   . History of blood transfusion   . History of stomach ulcers   . Hypertension   . Migraines   . Pleurisy   . Urinary incontinence     Past Surgical History:  Procedure Laterality Date  . APPENDECTOMY  1960  . BREAST BIOPSY Left 1985  . BREAST RECONSTRUCTION  1986 and 2010   x's 2  . CHOLECYSTECTOMY  1987  . LASIK  2012  . MASTECTOMY  1985    Family History  Problem Relation Age of Onset  . Heart failure Father   . Arthritis Maternal Grandmother   . Breast cancer Mother   . Depression Mother   . Depression Father     Social History   Social History  . Marital status: Married    Spouse name: N/A  . Number of children: N/A  . Years of education: N/A   Occupational History  . [employer]   Social History Main Topics  . Smoking status: Never Smoker  . Smokeless tobacco: Never Used  . Alcohol use Yes     Comment: occasionally  . Drug use: No  . Sexual activity: Yes   Other Topics Concern  . Not on file   Social History Narrative  . No narrative on file    Outpatient Medications Prior to Visit  Medication Sig Dispense Refill  . ALPRAZolam (XANAX) 0.5 MG tablet Take 1 tablet (0.5 mg total) by mouth at bedtime as needed for anxiety. 30 tablet 0    . cyclobenzaprine (FLEXERIL) 10 MG tablet Take 1 tablet (10 mg total) by mouth 3 (three) times daily as needed for muscle spasms. 30 tablet 0  . fluticasone (FLONASE) 50 MCG/ACT nasal spray Place 2 sprays into both nostrils daily as needed.    Marland Kitchen lisdexamfetamine (VYVANSE) 40 MG capsule Take 1 capsule (40 mg total) by mouth every morning. 30 capsule 0  . lisdexamfetamine (VYVANSE) 40 MG capsule Take 1 capsule (40 mg total) by mouth every morning. 30 capsule 0  . lisdexamfetamine (VYVANSE) 40 MG capsule Take 1 capsule (40 mg total) by mouth every morning. 30 capsule 0  . loratadine (CLARITIN) 10 MG tablet Take 1 tablet (10 mg total) by mouth daily. 90 tablet 3  . meloxicam (MOBIC) 15 MG tablet TAKE 1 TABLET BY MOUTH DAILY 90 tablet 0  . SUMAtriptan (IMITREX) 100 MG tablet Take 1 tablet (100 mg total) by mouth every 2 (two) hours as needed for migraine. May repeat in 2 hours if headache persists or recurs. (Patient not taking: Reported on 04/10/2016) 10 tablet 0   No facility-administered medications prior to visit.     Allergies  Allergen Reactions  .  Nitroglycerin     Migraine    Review of Systems  Constitutional: Negative for fever.  HENT: Negative for congestion.   Eyes: Negative for blurred vision.  Respiratory: Negative for cough.   Cardiovascular: Negative for chest pain and palpitations.  Gastrointestinal: Negative for vomiting.  Musculoskeletal: Negative for back pain.  Skin: Negative for rash.  Neurological: Negative for loss of consciousness and headaches.       Objective:    Physical Exam  Constitutional: She is oriented to person, place, and time. She appears well-developed and well-nourished. No distress.  HENT:  Head: Normocephalic and atraumatic.  Eyes: Conjunctivae are normal. Pupils are equal, round, and reactive to light.  Neck: Normal range of motion. No thyromegaly present.  Cardiovascular: Normal rate and regular rhythm.   Pulmonary/Chest: Effort normal and  breath sounds normal. She has no wheezes.  Abdominal: Soft. Bowel sounds are normal. There is no tenderness.    Musculoskeletal: Normal range of motion. She exhibits no edema or deformity.  Neurological: She is alert and oriented to person, place, and time.  Skin: Skin is warm and dry. She is not diaphoretic.  Psychiatric: She has a normal mood and affect.  Nursing note and vitals reviewed.   BP 128/82 (BP Location: Left Arm, Patient Position: Sitting, Cuff Size: Large)   Pulse 82   Temp 97.8 F (36.6 C) (Oral)   Resp 16   Ht 5\' 5"  (1.651 m)   Wt 211 lb 6.4 oz (95.9 kg)   SpO2 95%   BMI 35.18 kg/m  Wt Readings from Last 3 Encounters:  04/10/16 211 lb 6.4 oz (95.9 kg)  03/17/16 214 lb 12.8 oz (97.4 kg)  01/13/16 226 lb 3.2 oz (102.6 kg)   BP Readings from Last 3 Encounters:  04/10/16 128/82  03/17/16 128/82  01/13/16 132/82     Immunization History  Administered Date(s) Administered  . Zoster 09/28/2014    Health Maintenance  Topic Date Due  . MAMMOGRAM  06/04/2015  . INFLUENZA VACCINE  11/08/2016 (Originally 08/09/2016)  . PAP SMEAR  06/03/2016  . COLONOSCOPY  01/10/2020  . TETANUS/TDAP  06/04/2023  . Hepatitis C Screening  Completed  . HIV Screening  Completed    Lab Results  Component Value Date   WBC 5.5 09/28/2014   HGB 13.2 09/28/2014   HCT 39.9 09/28/2014   PLT 247.0 09/28/2014   GLUCOSE 92 09/28/2014   CHOL 185 09/28/2014   TRIG 85.0 09/28/2014   HDL 68.00 09/28/2014   LDLCALC 100 (H) 09/28/2014   ALT 16 09/28/2014   AST 15 09/28/2014   NA 140 09/28/2014   K 3.9 09/28/2014   CL 106 09/28/2014   CREATININE 0.81 09/28/2014   BUN 22 09/28/2014   CO2 24 09/28/2014   TSH 1.50 09/28/2014    Lab Results  Component Value Date   TSH 1.50 09/28/2014   Lab Results  Component Value Date   WBC 5.5 09/28/2014   HGB 13.2 09/28/2014   HCT 39.9 09/28/2014   MCV 84.1 09/28/2014   PLT 247.0 09/28/2014   Lab Results  Component Value Date   NA 140  09/28/2014   K 3.9 09/28/2014   CO2 24 09/28/2014   GLUCOSE 92 09/28/2014   BUN 22 09/28/2014   CREATININE 0.81 09/28/2014   BILITOT 0.3 09/28/2014   ALKPHOS 51 09/28/2014   AST 15 09/28/2014   ALT 16 09/28/2014   PROT 7.2 09/28/2014   ALBUMIN 4.1 09/28/2014   CALCIUM 9.1 09/28/2014  GFR 76.53 09/28/2014   Lab Results  Component Value Date   CHOL 185 09/28/2014   Lab Results  Component Value Date   HDL 68.00 09/28/2014   Lab Results  Component Value Date   LDLCALC 100 (H) 09/28/2014   Lab Results  Component Value Date   TRIG 85.0 09/28/2014   Lab Results  Component Value Date   CHOLHDL 3 09/28/2014   No results found for: HGBA1C       Assessment & Plan:   Problem List Items Addressed This Visit      Unprioritized   Sebaceous cyst    See procedure note rto 4-5 days for suture removal        Other Visit Diagnoses    Other migraine without status migrainosus, not intractable    -  Primary   Relevant Medications   zolmitriptan (ZOMIG) 5 MG tablet      I have discontinued Ms. Ruderman's SUMAtriptan. I am also having her start on zolmitriptan. Additionally, I am having her maintain her fluticasone, loratadine, meloxicam, cyclobenzaprine, lisdexamfetamine, lisdexamfetamine, lisdexamfetamine, and ALPRAZolam.  Meds ordered this encounter  Medications  . zolmitriptan (ZOMIG) 5 MG tablet    Sig: Take 1 tablet (5 mg total) by mouth as needed for migraine.    Dispense:  10 tablet    Refill:  0    CMA served as scribe during this visit. History, Physical and Plan performed by medical provider. Documentation and orders reviewed and attested to.  Ann Held, [name]   Patient ID: Theresa [name], female   DOB: [birth year], 61 y.o.   MRN: [id number]

## 2016-04-14 ENCOUNTER — Ambulatory Visit: Payer: BC Managed Care – PPO | Admitting: Family Medicine

## 2016-04-17 ENCOUNTER — Ambulatory Visit (INDEPENDENT_AMBULATORY_CARE_PROVIDER_SITE_OTHER): Payer: BC Managed Care – PPO | Admitting: Family Medicine

## 2016-04-17 ENCOUNTER — Encounter: Payer: Self-pay | Admitting: Family Medicine

## 2016-04-17 VITALS — BP 129/76 | HR 87 | Temp 98.3°F | Ht 65.0 in | Wt 210.4 lb

## 2016-04-17 DIAGNOSIS — L723 Sebaceous cyst: Secondary | ICD-10-CM | POA: Diagnosis not present

## 2016-04-17 NOTE — Progress Notes (Signed)
Chief Complaint  Patient presents with  . Drainage from Incision    groin area   Pt here for excision of cyst. No fevers or other concerns. She also needs sutures removed from a procedure last week.  BP 129/76 (BP Location: Left Arm, Patient Position: Sitting, Cuff Size: Large)   Pulse 87   Temp 98.3 F (36.8 C) (Oral)   Ht 5\' 5"  (1.651 m)   Wt 210 lb 6.4 oz (95.4 kg)   SpO2 95%   BMI 35.01 kg/m  Gen- awake, alert Skin- 1 cm x 0.7 cm cystic structure in R inguinal region, no TTP, central opening with black rim, no erythema or fluctuance. 65- age appropriate judgment and insight  Procedure note; excision of cyst Informed consent was obtained. The area was cleaned with alcohol and injected with 0.5 mL of 1% lidocaine with epinephrine. Sterile gloves used. The area was then cleaned with betadine. A 15 blade scalpel was then used to dissect down to capsule surface. Pressure was applied at the base and the cyst was forced from opening. 2 Simple 5-0 nylon sutures were placed with good approximation of wound edges. The area was dressed with triple antibiotic ointment and a bandage. There were no complications noted. The patient tolerated the procedure well.  Sebaceous cyst - Plan: PR EXC SKIN BENIG 1.1-2 CM TRUNK,ARM,LEG  After care instructions voiced and written. F/u in 1 week for suture removal. Pt voiced understanding and agreement to the plan.  Crosby Oyster [place], [place] 5:05 PM 04/17/16

## 2016-04-17 NOTE — Patient Instructions (Signed)
Do not shower for the rest of the day. When you do wash it, use only soap and water. Do not vigorously scrub. Apply triple antibiotic ointment (like Neosporin) twice daily. Keep the area clean and dry.   Things to look out for: increasing pain not relieved by ibuprofen/acetaminophen, fevers, spreading redness, drainage of pus, or foul odor.  

## 2016-04-17 NOTE — Progress Notes (Signed)
Pre visit review using our clinic review tool, if applicable. No additional management support is needed unless otherwise documented below in the visit note. 

## 2016-04-24 ENCOUNTER — Encounter: Payer: Self-pay | Admitting: Family Medicine

## 2016-04-24 ENCOUNTER — Ambulatory Visit: Payer: BC Managed Care – PPO | Admitting: Family Medicine

## 2016-04-24 ENCOUNTER — Ambulatory Visit (INDEPENDENT_AMBULATORY_CARE_PROVIDER_SITE_OTHER): Payer: BC Managed Care – PPO | Admitting: Family Medicine

## 2016-04-24 VITALS — BP 140/84 | HR 88 | Temp 97.6°F | Ht 65.0 in | Wt 209.6 lb

## 2016-04-24 DIAGNOSIS — Z4802 Encounter for removal of sutures: Secondary | ICD-10-CM

## 2016-04-24 NOTE — Progress Notes (Signed)
Chief Complaint  Patient presents with  . Suture / Staple Removal    1 week   Theresa [name] had a cyst removal 1 week ago and is here for suture removal. No signs/symptoms of infection.  2 simple sutures removed, pt tolerated well, no complications.   F/u prn. Pt voiced understanding and agreement to the plan.  [place], DO 04/24/16 11:30 AM

## 2016-04-24 NOTE — Progress Notes (Signed)
Pre visit review using our clinic review tool, if applicable. No additional management support is needed unless otherwise documented below in the visit note. 

## 2016-05-01 ENCOUNTER — Telehealth: Payer: Self-pay | Admitting: Family Medicine

## 2016-05-01 MED ORDER — LISDEXAMFETAMINE DIMESYLATE 40 MG PO CAPS: 40.0000 mg | ORAL_CAPSULE | ORAL | 0 refills | Status: DC

## 2016-05-01 NOTE — Telephone Encounter (Signed)
Self.   Refill request for VYVANSE. Pt would like to know if Rx could be picked up today by her spouse on his lunch break which is around 12 noon?    Please advise.   [phone]

## 2016-05-01 NOTE — Telephone Encounter (Signed)
Refill for 3 months. 

## 2016-05-01 NOTE — Telephone Encounter (Signed)
Printed for 3 months. PCP signed Called the patient informed to pickup at the front desk.

## 2016-05-01 NOTE — Telephone Encounter (Signed)
Requesting: vyvanse Contract   None UDS   None Last OV     05/05/16---future appt is on 07/06/16 Last Refill    #30 no refills on 01/25/16  Please Advise

## 2016-05-02 ENCOUNTER — Telehealth: Payer: Self-pay

## 2016-05-02 NOTE — Telephone Encounter (Signed)
PA initiated via Covermymeds; KEY: [id number]. Awaiting determination.

## 2016-05-10 NOTE — Telephone Encounter (Signed)
PA approved from 11/15/2015 through 11/15/2018.

## 2016-06-07 ENCOUNTER — Encounter: Payer: Self-pay | Admitting: Family Medicine

## 2016-06-20 ENCOUNTER — Encounter: Payer: Self-pay | Admitting: Family Medicine

## 2016-07-06 ENCOUNTER — Encounter: Payer: Self-pay | Admitting: Family Medicine

## 2016-07-06 ENCOUNTER — Ambulatory Visit (INDEPENDENT_AMBULATORY_CARE_PROVIDER_SITE_OTHER): Payer: BC Managed Care – PPO | Admitting: Family Medicine

## 2016-07-06 VITALS — BP 132/86 | HR 80 | Temp 98.6°F | Resp 16 | Ht 65.0 in | Wt 210.2 lb

## 2016-07-06 DIAGNOSIS — F988 Other specified behavioral and emotional disorders with onset usually occurring in childhood and adolescence: Secondary | ICD-10-CM | POA: Insufficient documentation

## 2016-07-06 DIAGNOSIS — R600 Localized edema: Secondary | ICD-10-CM

## 2016-07-06 MED ORDER — LISDEXAMFETAMINE DIMESYLATE 50 MG PO CAPS: 50.0000 mg | ORAL_CAPSULE | Freq: Every day | ORAL | 0 refills | Status: DC

## 2016-07-06 MED ORDER — FUROSEMIDE 20 MG PO TABS: 20.0000 mg | ORAL_TABLET | Freq: Every day | ORAL | 1 refills | Status: DC

## 2016-07-06 NOTE — Patient Instructions (Signed)
Edema Edema is when you have too much fluid in your body or under your skin. Edema may make your legs, feet, and ankles swell up. Swelling is also common in looser tissues, like around your eyes. This is a common condition. It gets more common as you get older. There are many possible causes of edema. Eating too much salt (sodium) and being on your feet or sitting for a long time can cause edema in your legs, feet, and ankles. Hot weather may make edema worse. Edema is usually painless. Your skin may look swollen or shiny. Follow these instructions at home:  Keep the swollen body part raised (elevated) above the level of your heart when you are sitting or lying down.  Do not sit still or stand for a long time.  Do not wear tight clothes. Do not wear garters on your upper legs.  Exercise your legs. This can help the swelling go down.  Wear elastic bandages or support stockings as told by your doctor.  Eat a low-salt (low-sodium) diet to reduce fluid as told by your doctor.  Depending on the cause of your swelling, you may need to limit how much fluid you drink (fluid restriction).  Take over-the-counter and prescription medicines only as told by your doctor. Contact a doctor if:  Treatment is not working.  You have heart, liver, or kidney disease and have symptoms of edema.  You have sudden and unexplained weight gain. Get help right away if:  You have shortness of breath or chest pain.  You cannot breathe when you lie down.  You have pain, redness, or warmth in the swollen areas.  You have heart, liver, or kidney disease and get edema all of a sudden.  You have a fever and your symptoms get worse all of a sudden. Summary  Edema is when you have too much fluid in your body or under your skin.  Edema may make your legs, feet, and ankles swell up. Swelling is also common in looser tissues, like around your eyes.  Raise (elevate) the swollen body part above the level of your  heart when you are sitting or lying down.  Follow your doctor's instructions about diet and how much fluid you can drink (fluid restriction). This information is not intended to replace advice given to you by your health care provider. Make sure you discuss any questions you have with your health care provider. Document Released: 06/14/2007 Document Revised: 01/14/2016 Document Reviewed: 01/14/2016 Elsevier Interactive Patient Education  2017 [employer]  

## 2016-07-06 NOTE — Assessment & Plan Note (Addendum)
Lasix daily Elevate legs [employer] socks [employer] water intake Eat bananas/ oj daily

## 2016-07-06 NOTE — Assessment & Plan Note (Signed)
Inc vyvanse 50 mg daily  rto 6 months

## 2016-07-06 NOTE — Progress Notes (Signed)
Patient ID: Theresa [name], female   DOB: November 21, 1955, 61 y.o.   MRN: [id number]     Subjective:  I acted as a Education administrator for Dr. Carollee Herter.  [name], [place]   Patient ID: Theresa [name], female    DOB: 29-Nov-1955, 61 y.o.   MRN: [id number]  Chief Complaint  Patient presents with  . ADD  . Foot Swelling    HPI   Patient is in today for follow up ADD.  She states that she would like a higher dose of medication because she has no energy.  She would like her ankles looked at.  She went to the hospital in [place] for swelling.  Ankles are still hurting.  Patient Care Team: Carollee Herter, Alferd Apa, [name] as PCP - General (Family Medicine)   Past Medical History:  Diagnosis Date  . Breast cancer ([place])    Left breast  . Depression   . GERD (gastroesophageal reflux disease)   . History of blood transfusion   . History of stomach ulcers   . Hypertension   . Migraines   . Pleurisy   . Urinary incontinence     Past Surgical History:  Procedure Laterality Date  . APPENDECTOMY  1960  . BREAST BIOPSY Left 1985  . BREAST RECONSTRUCTION  1986 and 2010   x's 2  . CHOLECYSTECTOMY  1987  . LASIK  2012  . MASTECTOMY  1985    Family History  Problem Relation Age of Onset  . Heart failure Father   . Arthritis Maternal Grandmother   . Breast cancer Mother   . Depression Mother   . Depression Father     Social History   Social History  . Marital status: Married    Spouse name: N/A  . Number of children: N/A  . Years of education: N/A   Occupational History  . [employer]   Social History Main Topics  . Smoking status: Never Smoker  . Smokeless tobacco: Never Used  . Alcohol use Yes     Comment: occasionally  . Drug use: No  . Sexual activity: Yes   Other Topics Concern  . Not on file   Social History Narrative  . No narrative on file    Outpatient Medications Prior to Visit  Medication Sig Dispense Refill  . ALPRAZolam (XANAX) 0.5 MG  tablet Take 1 tablet (0.5 mg total) by mouth at bedtime as needed for anxiety. 30 tablet 0  . cyclobenzaprine (FLEXERIL) 10 MG tablet Take 1 tablet (10 mg total) by mouth 3 (three) times daily as needed for muscle spasms. 30 tablet 0  . fluticasone (FLONASE) 50 MCG/ACT nasal spray Place 2 sprays into both nostrils daily as needed.    . loratadine (CLARITIN) 10 MG tablet Take 1 tablet (10 mg total) by mouth daily. 90 tablet 3  . meloxicam (MOBIC) 15 MG tablet TAKE 1 TABLET BY MOUTH DAILY 90 tablet 0  . zolmitriptan (ZOMIG) 5 MG tablet Take 1 tablet (5 mg total) by mouth as needed for migraine. 10 tablet 0  . lisdexamfetamine (VYVANSE) 40 MG capsule Take 1 capsule (40 mg total) by mouth every morning. 30 capsule 0  . lisdexamfetamine (VYVANSE) 40 MG capsule Take 1 capsule (40 mg total) by mouth every morning. 30 capsule 0  . lisdexamfetamine (VYVANSE) 40 MG capsule Take 1 capsule (40 mg total) by mouth every morning. 30 capsule 0   No facility-administered medications prior to visit.     Allergies  Allergen Reactions  . Nitroglycerin     Migraine    Review of Systems  Constitutional: Negative for fever and malaise/fatigue.  HENT: Negative for congestion.   Eyes: Negative for blurred vision.  Respiratory: Negative for cough and shortness of breath.   Cardiovascular: Negative for chest pain, palpitations and leg swelling.  Gastrointestinal: Negative for vomiting.  Musculoskeletal: Negative for back pain.  Skin: Negative for rash.  Neurological: Negative for loss of consciousness and headaches.       Objective:    Physical Exam  Constitutional: She is oriented to person, place, and time. She appears well-developed and well-nourished.  HENT:  Head: Normocephalic and atraumatic.  Eyes: Conjunctivae and EOM are normal.  Neck: Normal range of motion. Neck supple. No JVD present. Carotid bruit is not present. No thyromegaly present.  Cardiovascular: Normal rate, regular rhythm and  normal heart sounds.   No murmur heard. Pulmonary/Chest: Effort normal and breath sounds normal. No respiratory distress. She has no wheezes. She has no rales. She exhibits no tenderness.  Musculoskeletal: She exhibits no edema or tenderness.  Neurological: She is alert and oriented to person, place, and time.  Psychiatric: She has a normal mood and affect. Her behavior is normal. Judgment and thought content normal.  Nursing note and vitals reviewed.   BP 132/86 (BP Location: Right Arm, Cuff Size: Large)   Pulse 80   Temp 98.6 F (37 C) (Oral)   Resp 16   Ht 5\' 5"  (1.651 m)   Wt 210 lb 3.2 oz (95.3 kg)   SpO2 97%   BMI 34.98 kg/m  Wt Readings from Last 3 Encounters:  07/06/16 210 lb 3.2 oz (95.3 kg)  04/24/16 209 lb 9.6 oz (95.1 kg)  04/17/16 210 lb 6.4 oz (95.4 kg)   BP Readings from Last 3 Encounters:  07/06/16 132/86  04/24/16 140/84  04/17/16 129/76     Immunization History  Administered Date(s) Administered  . Zoster 09/28/2014    Health Maintenance  Topic Date Due  . MAMMOGRAM  06/04/2015  . PAP SMEAR  06/03/2016  . INFLUENZA VACCINE  11/08/2016 (Originally 08/09/2016)  . COLONOSCOPY  01/10/2020  . TETANUS/TDAP  06/04/2023  . Hepatitis C Screening  Completed  . HIV Screening  Completed    Lab Results  Component Value Date   WBC 5.5 09/28/2014   HGB 13.2 09/28/2014   HCT 39.9 09/28/2014   PLT 247.0 09/28/2014   GLUCOSE 92 09/28/2014   CHOL 185 09/28/2014   TRIG 85.0 09/28/2014   HDL 68.00 09/28/2014   LDLCALC 100 (H) 09/28/2014   ALT 16 09/28/2014   AST 15 09/28/2014   NA 140 09/28/2014   K 3.9 09/28/2014   CL 106 09/28/2014   CREATININE 0.81 09/28/2014   BUN 22 09/28/2014   CO2 24 09/28/2014   TSH 1.50 09/28/2014    Lab Results  Component Value Date   TSH 1.50 09/28/2014   Lab Results  Component Value Date   WBC 5.5 09/28/2014   HGB 13.2 09/28/2014   HCT 39.9 09/28/2014   MCV 84.1 09/28/2014   PLT 247.0 09/28/2014   Lab Results    Component Value Date   NA 140 09/28/2014   K 3.9 09/28/2014   CO2 24 09/28/2014   GLUCOSE 92 09/28/2014   BUN 22 09/28/2014   CREATININE 0.81 09/28/2014   BILITOT 0.3 09/28/2014   ALKPHOS 51 09/28/2014   AST 15 09/28/2014   ALT 16 09/28/2014   PROT 7.2 09/28/2014  ALBUMIN 4.1 09/28/2014   CALCIUM 9.1 09/28/2014   GFR 76.53 09/28/2014   Lab Results  Component Value Date   CHOL 185 09/28/2014   Lab Results  Component Value Date   HDL 68.00 09/28/2014   Lab Results  Component Value Date   LDLCALC 100 (H) 09/28/2014   Lab Results  Component Value Date   TRIG 85.0 09/28/2014   Lab Results  Component Value Date   CHOLHDL 3 09/28/2014   No results found for: HGBA1C       Assessment & Plan:   Problem List Items Addressed This Visit      Unprioritized   Attention deficit disorder - Primary    Inc vyvanse 50 mg daily  rto 6 months       Relevant Medications   lisdexamfetamine (VYVANSE) 50 MG capsule   Lower extremity edema    Lasix daily Elevate legs Compression socks Inc water intake Eat bananas/ oj daily        Relevant Medications   furosemide (LASIX) 20 MG tablet      I have discontinued Ms. Muscatello's lisdexamfetamine, lisdexamfetamine, and lisdexamfetamine. I am also having her start on lisdexamfetamine and furosemide. Additionally, I am having her maintain her fluticasone, loratadine, meloxicam, cyclobenzaprine, ALPRAZolam, and zolmitriptan.  Meds ordered this encounter  Medications  . lisdexamfetamine (VYVANSE) 50 MG capsule    Sig: Take 1 capsule (50 mg total) by mouth daily.    Dispense:  30 capsule    Refill:  0  . furosemide (LASIX) 20 MG tablet    Sig: Take 1 tablet (20 mg total) by mouth daily.    Dispense:  30 tablet    Refill:  1    CMA served as scribe during this visit. History, Physical and Plan performed by medical provider. Documentation and orders reviewed and attested to.  Ann Held, [name]

## 2016-07-13 ENCOUNTER — Ambulatory Visit: Payer: BC Managed Care – PPO | Admitting: Family Medicine

## 2016-07-19 NOTE — Telephone Encounter (Signed)
Ok to print rx  

## 2016-07-19 NOTE — Telephone Encounter (Signed)
This was an old message, but is this ok to do?

## 2016-08-02 ENCOUNTER — Other Ambulatory Visit: Payer: Self-pay | Admitting: Family Medicine

## 2016-08-02 DIAGNOSIS — F988 Other specified behavioral and emotional disorders with onset usually occurring in childhood and adolescence: Secondary | ICD-10-CM

## 2016-08-02 NOTE — Telephone Encounter (Signed)
Relation to JU:VQQU Call back number:[phone]   Reason for call:  Patient requesting a refill lisdexamfetamine (VYVANSE) 50 MG capsule

## 2016-08-03 MED ORDER — LISDEXAMFETAMINE DIMESYLATE 50 MG PO CAPS: 50.0000 mg | ORAL_CAPSULE | Freq: Every day | ORAL | 0 refills | Status: DC

## 2016-08-03 NOTE — Telephone Encounter (Signed)
Database done Dr. Etter Sjogren approved. Rx faxed.   PC

## 2016-08-03 NOTE — Telephone Encounter (Signed)
Need database if not done  

## 2016-08-03 NOTE — Telephone Encounter (Signed)
Requesting:  vyvanse Contract   none UDS   none Last OV    07/06/2016 Last Refill   #30 on 07/06/2016  Please Advise

## 2016-08-03 NOTE — Telephone Encounter (Signed)
rx was not faxed.  Patient called and made aware to pick up rx at the front ofc.   PC

## 2016-08-03 NOTE — Telephone Encounter (Signed)
Requesting:Vyvanse Contract: No UDS: NO Last OV: 07/06/16 Last Refill: 07/06/16 #30-0rf  Please Advise

## 2016-09-13 ENCOUNTER — Telehealth: Payer: Self-pay | Admitting: Family Medicine

## 2016-09-13 DIAGNOSIS — F988 Other specified behavioral and emotional disorders with onset usually occurring in childhood and adolescence: Secondary | ICD-10-CM

## 2016-09-14 MED ORDER — LISDEXAMFETAMINE DIMESYLATE 50 MG PO CAPS: 50.0000 mg | ORAL_CAPSULE | Freq: Every day | ORAL | 0 refills | Status: DC

## 2016-09-14 NOTE — Telephone Encounter (Signed)
Dr. Etter Sjogren informed that we are currently out of UDS' supplies, per DO okay to get UDS at next opportunity. Pt informed via MyChart that Rx's have been placed at front desk for pick up at her convenience.

## 2016-09-14 NOTE — Telephone Encounter (Signed)
Pt is requesting refill on Vyvanse 50mg .  Last OV: 07/06/2016 Last Fill: 08/03/2016 #30 and 0RF (Pt requesting 3 months) UDS: None seen (Pt's husband picks up prescriptions for her)  Please advise.

## 2016-09-14 NOTE — Telephone Encounter (Signed)
Need uds And contract Ok to refill 3 months

## 2016-09-14 NOTE — Telephone Encounter (Signed)
Rx's for September, October, November printed, awaiting DO signature.   Contract was signed 01/13/2016 (in media).

## 2016-12-08 ENCOUNTER — Encounter: Payer: Self-pay | Admitting: Family Medicine

## 2016-12-08 ENCOUNTER — Ambulatory Visit: Payer: BC Managed Care – PPO | Admitting: Family Medicine

## 2016-12-08 DIAGNOSIS — F988 Other specified behavioral and emotional disorders with onset usually occurring in childhood and adolescence: Secondary | ICD-10-CM | POA: Diagnosis not present

## 2016-12-08 MED ORDER — LISDEXAMFETAMINE DIMESYLATE 50 MG PO CAPS: 50.0000 mg | ORAL_CAPSULE | Freq: Every day | ORAL | 0 refills | Status: DC

## 2016-12-08 NOTE — Progress Notes (Signed)
Subjective:  I acted as a Education administrator for [employer], [place]   Patient ID: Theresa [name], female    DOB: 16-Nov-1955, 61 y.o.   MRN: [id number]  Chief Complaint  Patient presents with  . Follow-up    HPI  Patient is in today for Refills-- she is doing well with the medication.  No complaints  Patient Care Team: Carollee Herter, Alferd Apa, DO as PCP - General (Family Medicine)   Past Medical History:  Diagnosis Date  . Breast cancer ([place])    Left breast  . Depression   . GERD (gastroesophageal reflux disease)   . History of blood transfusion   . History of stomach ulcers   . Hypertension   . Migraines   . Pleurisy   . Urinary incontinence     Past Surgical History:  Procedure Laterality Date  . APPENDECTOMY  1960  . BREAST BIOPSY Left 1985  . BREAST RECONSTRUCTION  1986 and 2010   x's 2  . CHOLECYSTECTOMY  1987  . LASIK  2012  . MASTECTOMY  1985    Family History  Problem Relation Age of Onset  . Breast cancer Mother   . Depression Mother   . Heart failure Father   . Depression Father   . Arthritis Maternal Grandmother     Social History   Socioeconomic History  . Marital status: Married    Spouse name: Not on file  . Number of children: Not on file  . Years of education: Not on file  . Highest education level: Not on file  Social Needs  . Financial resource strain: Not on file  . Food insecurity - worry: Not on file  . Food insecurity - inability: Not on file  . Transportation needs - medical: Not on file  . Transportation needs - non-medical: Not on file  Occupational History  . Occupation: Dealer: [employer]  Tobacco Use  . Smoking status: Never Smoker  . Smokeless tobacco: Never Used  Substance and Sexual Activity  . Alcohol use: Yes    Comment: occasionally  . Drug use: No  . Sexual activity: Yes  Other Topics Concern  . Not on file  Social History Narrative  . Not on file    Outpatient  Medications Prior to Visit  Medication Sig Dispense Refill  . ALPRAZolam (XANAX) 0.5 MG tablet Take 1 tablet (0.5 mg total) by mouth at bedtime as needed for anxiety. 30 tablet 0  . cyclobenzaprine (FLEXERIL) 10 MG tablet Take 1 tablet (10 mg total) by mouth 3 (three) times daily as needed for muscle spasms. 30 tablet 0  . fluticasone (FLONASE) 50 MCG/ACT nasal spray Place 2 sprays into both nostrils daily as needed.    . furosemide (LASIX) 20 MG tablet Take 1 tablet (20 mg total) by mouth daily. 30 tablet 1  . lisdexamfetamine (VYVANSE) 50 MG capsule Take 1 capsule (50 mg total) by mouth daily. 30 capsule 0  . lisdexamfetamine (VYVANSE) 50 MG capsule Take 1 capsule (50 mg total) by mouth daily. 30 capsule 0  . loratadine (CLARITIN) 10 MG tablet Take 1 tablet (10 mg total) by mouth daily. 90 tablet 3  . meloxicam (MOBIC) 15 MG tablet TAKE 1 TABLET BY MOUTH DAILY 90 tablet 0  . zolmitriptan (ZOMIG) 5 MG tablet Take 1 tablet (5 mg total) by mouth as needed for migraine. 10 tablet 0  . lisdexamfetamine (VYVANSE) 50 MG capsule Take 1 capsule (50  mg total) by mouth daily. 30 capsule 0   No facility-administered medications prior to visit.     Allergies  Allergen Reactions  . Nitroglycerin     Migraine    ROS     Objective:    Physical Exam  Constitutional: She is oriented to person, place, and time. She appears well-developed and well-nourished.  HENT:  Head: Normocephalic and atraumatic.  Eyes: Conjunctivae and EOM are normal.  Neck: Normal range of motion. Neck supple. No JVD present. Carotid bruit is not present. No thyromegaly present.  Cardiovascular: Normal rate, regular rhythm and normal heart sounds.  No murmur heard. Pulmonary/Chest: Effort normal and breath sounds normal. No respiratory distress. She has no wheezes. She has no rales. She exhibits no tenderness.  Musculoskeletal: She exhibits no edema.  Neurological: She is alert and oriented to person, place, and time.    Psychiatric: She has a normal mood and affect.  Nursing note and vitals reviewed.   BP (!) 130/98   Pulse 83   Temp 97.8 F (36.6 C) (Oral)   Ht 5\' 4"  (1.626 m)   Wt 195 lb (88.5 kg)   SpO2 97%   BMI 33.47 kg/m  Wt Readings from Last 3 Encounters:  12/08/16 195 lb (88.5 kg)  07/06/16 210 lb 3.2 oz (95.3 kg)  04/24/16 209 lb 9.6 oz (95.1 kg)   BP Readings from Last 3 Encounters:  12/08/16 (!) 130/98  07/06/16 132/86  04/24/16 140/84     Immunization History  Administered Date(s) Administered  . Zoster 09/28/2014    Health Maintenance  Topic Date Due  . INFLUENZA VACCINE  10/09/2017 (Originally 08/09/2016)  . MAMMOGRAM  12/08/2017 (Originally 06/04/2015)  . PAP SMEAR  12/08/2017 (Originally 06/03/2016)  . COLONOSCOPY  01/10/2020  . TETANUS/TDAP  06/04/2023  . Hepatitis C Screening  Completed  . HIV Screening  Completed    Lab Results  Component Value Date   WBC 5.5 09/28/2014   HGB 13.2 09/28/2014   HCT 39.9 09/28/2014   PLT 247.0 09/28/2014   GLUCOSE 92 09/28/2014   CHOL 185 09/28/2014   TRIG 85.0 09/28/2014   HDL 68.00 09/28/2014   LDLCALC 100 (H) 09/28/2014   ALT 16 09/28/2014   AST 15 09/28/2014   NA 140 09/28/2014   K 3.9 09/28/2014   CL 106 09/28/2014   CREATININE 0.81 09/28/2014   BUN 22 09/28/2014   CO2 24 09/28/2014   TSH 1.50 09/28/2014    Lab Results  Component Value Date   TSH 1.50 09/28/2014   Lab Results  Component Value Date   WBC 5.5 09/28/2014   HGB 13.2 09/28/2014   HCT 39.9 09/28/2014   MCV 84.1 09/28/2014   PLT 247.0 09/28/2014   Lab Results  Component Value Date   NA 140 09/28/2014   K 3.9 09/28/2014   CO2 24 09/28/2014   GLUCOSE 92 09/28/2014   BUN 22 09/28/2014   CREATININE 0.81 09/28/2014   BILITOT 0.3 09/28/2014   ALKPHOS 51 09/28/2014   AST 15 09/28/2014   ALT 16 09/28/2014   PROT 7.2 09/28/2014   ALBUMIN 4.1 09/28/2014   CALCIUM 9.1 09/28/2014   GFR 76.53 09/28/2014   Lab Results  Component Value Date    CHOL 185 09/28/2014   Lab Results  Component Value Date   HDL 68.00 09/28/2014   Lab Results  Component Value Date   LDLCALC 100 (H) 09/28/2014   Lab Results  Component Value Date   TRIG 85.0 09/28/2014  Lab Results  Component Value Date   CHOLHDL 3 09/28/2014   No results found for: HGBA1C       Assessment & Plan:   Problem List Items Addressed This Visit      Unprioritized   Attention deficit disorder   Relevant Medications   lisdexamfetamine (VYVANSE) 50 MG capsule    pt refused flu and shingrix She refused mammogram and pap  I am having Brealynn A. Aispuro maintain her fluticasone, loratadine, meloxicam, cyclobenzaprine, ALPRAZolam, zolmitriptan, furosemide, lisdexamfetamine, lisdexamfetamine, and lisdexamfetamine.  Meds ordered this encounter  Medications  . lisdexamfetamine (VYVANSE) 50 MG capsule    Sig: Take 1 capsule (50 mg total) by mouth daily.    Dispense:  30 capsule    Refill:  0    CMA served as scribe during this visit. History, Physical and Plan performed by medical provider. Documentation and orders reviewed and attested to.  Ann Held, DO

## 2016-12-08 NOTE — Patient Instructions (Signed)
F/u 6 months for ADD

## 2016-12-25 ENCOUNTER — Ambulatory Visit: Payer: BC Managed Care – PPO | Admitting: Medical

## 2016-12-25 ENCOUNTER — Encounter: Payer: Self-pay | Admitting: Medical

## 2016-12-25 VITALS — BP 137/86 | HR 77 | Temp 98.0°F | Resp 16 | Ht 64.0 in | Wt 196.0 lb

## 2016-12-25 DIAGNOSIS — M542 Cervicalgia: Secondary | ICD-10-CM | POA: Diagnosis not present

## 2016-12-25 DIAGNOSIS — M791 Myalgia, unspecified site: Secondary | ICD-10-CM | POA: Diagnosis not present

## 2016-12-25 DIAGNOSIS — F411 Generalized anxiety disorder: Secondary | ICD-10-CM

## 2016-12-25 DIAGNOSIS — H1033 Unspecified acute conjunctivitis, bilateral: Secondary | ICD-10-CM | POA: Diagnosis not present

## 2016-12-25 MED ORDER — CYCLOBENZAPRINE HCL 10 MG PO TABS: ORAL_TABLET | ORAL | 0 refills | Status: DC

## 2016-12-25 MED ORDER — TOBRAMYCIN 0.3 % OP SOLN: 2.0000 [drp] | Freq: Four times a day (QID) | OPHTHALMIC | 0 refills | Status: DC

## 2016-12-25 MED ORDER — ALPRAZOLAM 0.5 MG PO TABS: 0.5000 mg | ORAL_TABLET | Freq: Every evening | ORAL | 0 refills | Status: DC | PRN

## 2016-12-25 NOTE — Patient Instructions (Signed)
For your bilateral conjunctivitis I prescribed Tobrex eyedrops.  You should see gradual improvement if not please let me know.  In that event was switched to stronger drops.  For history of intermittent neck pain.  I prescribed Flexeril to use on occasion at night.  Location of pain on exam appears to be probable tension related.  Possible associated with degenerative changes of cervical spine as you report chiropractor found that on x-ray.  For anxiety, I am providing refill of Xanax 30 tablets.  But for further refills your PCP would not to decide.  Continue to use this sparingly.  Follow-up in 7-10 days or as needed.

## 2016-12-25 NOTE — Progress Notes (Signed)
Subjective:    Patient ID: Theresa [name], female    DOB: 13-Dec-1955, 61 y.o.   MRN: [id number]  HPI  Pt in with some rt eye slight irritation and redness. Then gradually got worse. Then yesterday got some matting yesterday morning. Then this morning matting thicker and she describes lids were stuck together. This morning left eye mild irritated and watery.    No vision changes reported.  Pt is retired Pharmacist, hospital. Has grandson who had pink eye one week ago but no direct contact with him   Pt also has history of anxiety in past. Rare use of xanax per pt. Pt pcp is Dr. Etter Sjogren.   Pt also had 3rd complaint wanted advise on pain in her neck with some ha. In past with computer work and leaning forward pain will increase. No xray seen on review of pt chart. Chiropracter did xray in the past.  No acute neck pain presently nor does she have a headache.  But she is seeking advice on chronic intermittent neck pain.    Review of Systems  Constitutional: Negative for chills, fatigue and fever.  Eyes: Positive for discharge, redness and visual disturbance. Negative for photophobia and pain.  Respiratory: Negative for chest tightness, shortness of breath and wheezing.   Cardiovascular: Negative for chest pain and palpitations.  Gastrointestinal: Negative for abdominal pain.  Musculoskeletal: Positive for neck pain. Negative for back pain and neck stiffness.  Skin: Negative for rash.  Neurological: Negative for dizziness, seizures, weakness and numbness.       Occasional tension and migraine ha but not currently  Hematological: Negative for adenopathy. Does not bruise/bleed easily.  Psychiatric/Behavioral: Negative for confusion, self-injury and suicidal ideas. The patient is nervous/anxious.    Past Medical History:  Diagnosis Date  . Breast cancer ([place])    Left breast  . Depression   . GERD (gastroesophageal reflux disease)   . History of blood transfusion   . History of stomach ulcers   .  Hypertension   . Migraines   . Pleurisy   . Urinary incontinence      Social History   Socioeconomic History  . Marital status: Married    Spouse name: Not on file  . Number of children: Not on file  . Years of education: Not on file  . Highest education level: Not on file  Social Needs  . Financial resource strain: Not on file  . Food insecurity - worry: Not on file  . Food insecurity - inability: Not on file  . Transportation needs - medical: Not on file  . Transportation needs - non-medical: Not on file  Occupational History  . 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 Smoking status: Never Smoker  . Smokeless tobacco: Never Used  Substance and Sexual Activity  . Alcohol use: Yes    Comment: occasionally  . Drug use: No  . Sexual activity: Yes  Other Topics Concern  . Not on file  Social History Narrative  . Not on file    Past Surgical History:  Procedure Laterality Date  . APPENDECTOMY  1960  . BREAST BIOPSY Left 1985  . BREAST RECONSTRUCTION  1986 and 2010   x's 2  . CHOLECYSTECTOMY  1987  . LASIK  2012  . MASTECTOMY  1985    Family History  Problem Relation Age of Onset  . Breast cancer Mother   . Depression Mother   . Heart failure Father   .  Depression Father   . Arthritis Maternal Grandmother     Allergies  Allergen Reactions  . Nitroglycerin     Migraine    Current Outpatient Medications on File Prior to Visit  Medication Sig Dispense Refill  . fluticasone (FLONASE) 50 MCG/ACT nasal spray Place 2 sprays into both nostrils daily as needed.    . furosemide (LASIX) 20 MG tablet Take 1 tablet (20 mg total) by mouth daily. 30 tablet 1  . lisdexamfetamine (VYVANSE) 50 MG capsule Take 1 capsule (50 mg total) by mouth daily. 30 capsule 0  . lisdexamfetamine (VYVANSE) 50 MG capsule Take 1 capsule (50 mg total) by mouth daily. 30 capsule 0  . lisdexamfetamine (VYVANSE) 50 MG capsule Take 1 capsule (50 mg  total) by mouth daily. 30 capsule 0  . loratadine (CLARITIN) 10 MG tablet Take 1 tablet (10 mg total) by mouth daily. 90 tablet 3  . meloxicam (MOBIC) 15 MG tablet TAKE 1 TABLET BY MOUTH DAILY 90 tablet 0  . zolmitriptan (ZOMIG) 5 MG tablet Take 1 tablet (5 mg total) by mouth as needed for migraine. 10 tablet 0   No current facility-administered medications on file prior to visit.     BP 137/86   Pulse 77   Temp 98 F (36.7 C) (Oral)   Resp 16   Ht 5\' 4"  (1.626 m)   Wt 196 lb (88.9 kg)   SpO2 100%   BMI 33.64 kg/m      Objective:   Physical Exam  General Mental Status- Alert. General Appearance- Not in acute distress.   Eyes-PERRLA.  EOMs intact.  Right eye conjunctiva injected with some dry matting eyelashes region.  Left eye minimal redness but with no matting.  Skin General: Color- Normal Color. Moisture- Normal Moisture.  Neck Carotid Arteries- Normal color. Moisture- Normal Moisture. No carotid bruits. No JVD. Bilateral pain on palpation where the trapezius muscles insert into the occipital region.  No mid cervical spine pain.  Chest and Lung Exam Auscultation: Breath Sounds:-Normal.  Cardiovascular Auscultation:Rythm- Regular. Murmurs & Other Heart Sounds:Auscultation of the heart reveals- No Murmurs.  Abdomen Inspection:-Inspeection Normal. Palpation/Percussion:Note:No mass. Palpation and Percussion of the abdomen reveal- Non Tender, Non Distended + BS, no rebound or guarding.   Neurologic Cranial Nerve exam:- CN III-XII intact(No nystagmus), symmetric smile. Strength:- 5/5 equal and symmetric strength both upper and lower extremities.      Assessment & Plan:  For your bilateral conjunctivitis I prescribed Tobrex eyedrops.  You should see gradual improvement if not please let me know.  In that event was switched to stronger drops.  For history of intermittent neck pain.  I prescribed Flexeril to use on occasion at night.  Location of pain on exam  appears to be probable tension related.  Possible associated with degenerative changes of cervical spine as you report chiropractor found that on x-ray.  For anxiety, I am providing refill of Xanax 30 tablets.  But for further refills your PCP would not to decide.  Continue to use this sparingly.  Follow-up in 7-10 days or as needed.  Ezzard Ditmer, Percell Miller, PA-C

## 2017-01-04 ENCOUNTER — Ambulatory Visit: Payer: BC Managed Care – PPO | Admitting: Family Medicine

## 2017-01-20 ENCOUNTER — Other Ambulatory Visit: Payer: Self-pay | Admitting: Medical

## 2017-01-20 DIAGNOSIS — M791 Myalgia, unspecified site: Secondary | ICD-10-CM

## 2017-01-22 ENCOUNTER — Other Ambulatory Visit: Payer: Self-pay | Admitting: Medical

## 2017-01-22 ENCOUNTER — Other Ambulatory Visit: Payer: Self-pay | Admitting: Family Medicine

## 2017-01-22 DIAGNOSIS — F988 Other specified behavioral and emotional disorders with onset usually occurring in childhood and adolescence: Secondary | ICD-10-CM

## 2017-01-22 DIAGNOSIS — M791 Myalgia, unspecified site: Secondary | ICD-10-CM

## 2017-01-22 DIAGNOSIS — Z79899 Other long term (current) drug therapy: Secondary | ICD-10-CM

## 2017-01-22 NOTE — Telephone Encounter (Signed)
Pt is requesting refill on Cyclobenzaprine. Please advise.

## 2017-01-22 NOTE — Telephone Encounter (Signed)
I did refill patient's Flexeril but limited prescription of 30 tablets was given.  I do want her to follow-up with Dr. Etter Sjogren if possible within 3-4 weeks to discuss this approach to treat her neck pain.  Or if Dr. Etter Sjogren does not have anything available then make appointment with me and I would send a copy of my note on her visit with me..  Flexeril has some sedating side effects and want her to be cautious when using.  Try just use 1 tablet and try to minimize use such as every third night or so.

## 2017-01-22 NOTE — Telephone Encounter (Signed)
Copied from [place] [id number]. Topic: Quick Communication - See Telephone Encounter >> Jan 22, 2017  4:52 PM Conception Chancy, NT wrote: CRM for notification. See Telephone encounter for:  01/22/17.  Patient says she called a week ago to get a refill on Vyvanse and instead she states Flexeril was sent into the pharmacy. She needs [facility] [place]. Wants Dr. Carollee Herter to be aware she has been off of it for a week now.

## 2017-01-23 ENCOUNTER — Other Ambulatory Visit (INDEPENDENT_AMBULATORY_CARE_PROVIDER_SITE_OTHER): Payer: BC Managed Care – PPO

## 2017-01-23 DIAGNOSIS — Z79899 Other long term (current) drug therapy: Secondary | ICD-10-CM | POA: Diagnosis not present

## 2017-01-23 MED ORDER — LISDEXAMFETAMINE DIMESYLATE 50 MG PO CAPS: 50.0000 mg | ORAL_CAPSULE | Freq: Every day | ORAL | 0 refills | Status: DC

## 2017-01-23 NOTE — Telephone Encounter (Signed)
Pt called in asking about ADHD Rx and stated she needs a refill. Advised Pt to come in for UDS and contract. Pt states that she will be in today for screening and refill.

## 2017-01-23 NOTE — Progress Notes (Signed)
poct

## 2017-01-23 NOTE — Telephone Encounter (Signed)
Notified pt. PT states she's doing better just like to keep Flexeril on hand just in case she needs it.

## 2017-01-25 LAB — PAIN MGMT, PROFILE 8 W/CONF, U
6 Acetylmorphine: NEGATIVE ng/mL (ref <10)
Alcohol Metabolites: NEGATIVE ng/mL (ref <500)
Amphetamine: 12606 ng/mL — ABNORMAL HIGH (ref <250)
Amphetamines: POSITIVE ng/mL — AB (ref <500)
Benzodiazepines: NEGATIVE ng/mL (ref <100)
Buprenorphine, Urine: NEGATIVE ng/mL (ref <5)
COCAINE METABOLITE: NEGATIVE ng/mL (ref <150)
Creatinine: 111.7 mg/dL
MARIJUANA METABOLITE: NEGATIVE ng/mL (ref <20)
MDMA: NEGATIVE ng/mL (ref <500)
METHAMPHETAMINE: NEGATIVE ng/mL (ref <250)
Opiates: NEGATIVE ng/mL (ref <100)
Oxidant: NEGATIVE ug/mL (ref <200)
Oxycodone: NEGATIVE ng/mL (ref <100)
pH: 5.91 (ref 4.5–9.0)

## 2017-01-31 NOTE — Telephone Encounter (Signed)
Refilled on 01/22/17 by Dr. Carollee Herter

## 2017-03-12 ENCOUNTER — Other Ambulatory Visit: Payer: Self-pay | Admitting: Family Medicine

## 2017-03-12 ENCOUNTER — Telehealth: Payer: Self-pay | Admitting: Family Medicine

## 2017-03-12 ENCOUNTER — Other Ambulatory Visit: Payer: Self-pay | Admitting: *Deleted

## 2017-03-12 ENCOUNTER — Other Ambulatory Visit: Payer: Self-pay | Admitting: Medical

## 2017-03-12 DIAGNOSIS — M791 Myalgia, unspecified site: Secondary | ICD-10-CM

## 2017-03-12 DIAGNOSIS — F411 Generalized anxiety disorder: Secondary | ICD-10-CM

## 2017-03-12 MED ORDER — CYCLOBENZAPRINE HCL 5 MG PO TABS: 5.0000 mg | ORAL_TABLET | Freq: Every evening | ORAL | 0 refills | Status: DC | PRN

## 2017-03-12 MED ORDER — FLUTICASONE PROPIONATE 50 MCG/ACT NA SUSP: 2.0000 | Freq: Every day | NASAL | 5 refills | Status: DC | PRN

## 2017-03-12 MED ORDER — LORATADINE 10 MG PO TABS: 10.0000 mg | ORAL_TABLET | Freq: Every day | ORAL | 1 refills | Status: DC

## 2017-03-12 NOTE — Telephone Encounter (Signed)
Pt is requesting refill on alprazolam. Theresa Pt.   Last OV: 12/25/2016 w/ Edward Last Fill: 12/25/2016 #30 and 0RF UDS: 01/23/2017 Low risk  Please advise.

## 2017-03-12 NOTE — Telephone Encounter (Signed)
Copied from [place]. Topic: General - Other >> Mar 12, 2017  8:44 AM Darl Householder, RMA wrote: Reason for CRM: Medication refill request for fluticasone (FLONASE) 50 MCG/ACT nasal spray and  loratadine (CLARITIN) 10 MG to be [facility] to [facility]

## 2017-03-12 NOTE — Telephone Encounter (Signed)
Flonase refilled by office.  Claritin refilled per protocol. LOV: 07/06/16

## 2017-03-13 MED ORDER — ALPRAZOLAM 0.5 MG PO TABS: 0.5000 mg | ORAL_TABLET | Freq: Every evening | ORAL | 0 refills | Status: DC | PRN

## 2017-03-23 ENCOUNTER — Other Ambulatory Visit: Payer: Self-pay | Admitting: Family Medicine

## 2017-03-23 DIAGNOSIS — M791 Myalgia, unspecified site: Secondary | ICD-10-CM

## 2017-04-09 ENCOUNTER — Telehealth: Payer: Self-pay | Admitting: Family Medicine

## 2017-04-09 NOTE — Telephone Encounter (Signed)
Pt. Called to report she took her Vyvanse at 0530. Forgot and took another at 0630. Encouraged to eat breakfast and drink plenty of water this morning. Will pay attention to how she feels and call back as needed.

## 2017-05-02 ENCOUNTER — Other Ambulatory Visit: Payer: Self-pay | Admitting: Family Medicine

## 2017-05-02 DIAGNOSIS — F988 Other specified behavioral and emotional disorders with onset usually occurring in childhood and adolescence: Secondary | ICD-10-CM

## 2017-05-02 DIAGNOSIS — M791 Myalgia, unspecified site: Secondary | ICD-10-CM

## 2017-05-02 NOTE — Telephone Encounter (Signed)
Copied from [place]. Topic: Quick Communication - Rx Refill/Question >> May 02, 2017 10:53 AM Arletha Grippe wrote: Medication: lisdexamfetamine (VYVANSE) 50 MG capsule Has the patient contacted their pharmacy? No. Control  (Agent: If no, request that the patient contact the pharmacy for the refill.) Preferred Pharmacy (with phone number or street name): [facility]  Agent: Please be advised that RX refills may take up to 3 business days. We ask that you follow-up with your pharmacy.

## 2017-05-03 NOTE — Telephone Encounter (Signed)
Request for refill of Vyvanse 50mg  cap Last filled on 03/23/17 #30  LOV: 12/08/16  Dr. Carollee Herter  [facility]

## 2017-05-04 MED ORDER — LISDEXAMFETAMINE DIMESYLATE 50 MG PO CAPS: 50.0000 mg | ORAL_CAPSULE | Freq: Every day | ORAL | 0 refills | Status: DC

## 2017-05-04 NOTE — Telephone Encounter (Signed)
Last Vyvanse RX: 01/23/17 Last OV: 12/08/16 Next OV: due 06/07/17 but not yet scheduled UDS: 01/23/17 CSC: 01/23/17, due 07/23/17 CSR:  04/04/17, #30.

## 2017-06-06 ENCOUNTER — Other Ambulatory Visit: Payer: Self-pay | Admitting: Family Medicine

## 2017-06-06 DIAGNOSIS — F988 Other specified behavioral and emotional disorders with onset usually occurring in childhood and adolescence: Secondary | ICD-10-CM

## 2017-06-06 NOTE — Telephone Encounter (Signed)
Last Vyvanse RX: 05/04/17, # 30. Last OV: 12/08/16 w/PCP  Next OV: due now but not scheduled UDS: 01/15/17 and due 07/23/17 CSC: 01/15/17 CSR: No discrepancies identified

## 2017-06-06 NOTE — Telephone Encounter (Signed)
Pt requesting refill of Loratadine which was already filled on 03/12/17 #90 with [facility].   Vyvanse refill Last Refill:05/04/17 #30 Last OV: 12/08/16 PCP: Dr. Carollee Herter [facility] [place]

## 2017-06-06 NOTE — Telephone Encounter (Signed)
Copied from [place] [id number]. Topic: Quick Communication - See Telephone Encounter >> Jun 06, 2017  8:53 AM Hewitt Shorts wrote: Pt is needing a refill on her vyvanse 5 mg  and also lovatadine 10 mg  [facility] [place] [phone]  Best number [phone]

## 2017-06-07 MED ORDER — LISDEXAMFETAMINE DIMESYLATE 50 MG PO CAPS: 50.0000 mg | ORAL_CAPSULE | Freq: Every day | ORAL | 0 refills | Status: DC

## 2017-07-09 ENCOUNTER — Other Ambulatory Visit: Payer: Self-pay | Admitting: Family Medicine

## 2017-07-09 DIAGNOSIS — F988 Other specified behavioral and emotional disorders with onset usually occurring in childhood and adolescence: Secondary | ICD-10-CM

## 2017-07-09 NOTE — Telephone Encounter (Signed)
Copied from [place] [phone]. Topic: Quick Communication - Rx Refill/Question >> Jul 09, 2017  3:25 PM Keene Breath wrote: Medication: lisdexamfetamine (VYVANSE) 50 MG capsule   Patient requesting refill for above medication.  CB# [phone]  Preferred Pharmacy (with phone number or street name): [id number] [place], [place] [phone] (Phone) [phone] (Fax)

## 2017-07-10 NOTE — Telephone Encounter (Signed)
Rx refill request: Vyvanse 50 mg        Last filled: 06/07/17 #30  (patient gets 3 Rx)  LOV: 12/08/16  PVP: [place]  Pharmacy: verified

## 2017-07-11 MED ORDER — LISDEXAMFETAMINE DIMESYLATE 50 MG PO CAPS: 50.0000 mg | ORAL_CAPSULE | Freq: Every day | ORAL | 0 refills | Status: DC

## 2017-07-11 NOTE — Telephone Encounter (Signed)
Requesting: Vynase 50mg  daily Contract: yes UDS: 01/23/17 Last OV: 12/08/17 Next Ov: N/A Last refill: 06/07/17 #30, 0RF Database: no discrepancies found  Please advise.

## 2017-08-08 ENCOUNTER — Other Ambulatory Visit: Payer: Self-pay | Admitting: Family Medicine

## 2017-08-08 DIAGNOSIS — F988 Other specified behavioral and emotional disorders with onset usually occurring in childhood and adolescence: Secondary | ICD-10-CM

## 2017-08-08 NOTE — Telephone Encounter (Signed)
Vyvanse refill. Pt requesting a 90 day supply Last Refill:07/11/17 #30 Last OV: 12/25/16 PCP: Dr. Carollee Herter [facility]acy:[facility] [place]    [address]

## 2017-08-08 NOTE — Telephone Encounter (Signed)
Copied from [place] [phone]. Topic: Quick Communication - See Telephone Encounter >> Aug 08, 2017  8:35 AM Mylinda Latina, NT wrote: CRM for notification. See Telephone encounter for: 08/08/17. Patient called and states she needs a refill of her lisdexamfetamine (VYVANSE) 50 MG capsule. She states that she would like a 90 supply sent to the pharmacy  [id number] [place], [place] [phone] (Phone) [phone] (Fax)

## 2017-08-09 MED ORDER — LISDEXAMFETAMINE DIMESYLATE 50 MG PO CAPS: 50.0000 mg | ORAL_CAPSULE | Freq: Every day | ORAL | 0 refills | Status: DC

## 2017-08-09 NOTE — Telephone Encounter (Signed)
Requesting: vyvanse 50mg  qday Contract: 2019 UDS: 01-23-17 Last OV: 12/25/2016 Next Ov: N/A Last refill: 07/11/17 Database: no discrepancies noted  Please advise.

## 2017-08-09 NOTE — Telephone Encounter (Signed)
I will refill 1 month-- but she needs ov

## 2017-08-13 NOTE — Telephone Encounter (Signed)
Author phoned pt. To set up appointment per Dr. Nonda Lou request. Appointment made for 8/22 at 0900.

## 2017-08-30 ENCOUNTER — Ambulatory Visit: Payer: BC Managed Care – PPO | Admitting: Family Medicine

## 2017-08-30 ENCOUNTER — Encounter: Payer: Self-pay | Admitting: Family Medicine

## 2017-08-30 VITALS — BP 156/96 | HR 68 | Temp 97.7°F | Resp 16 | Ht 64.0 in | Wt 194.2 lb

## 2017-08-30 DIAGNOSIS — Z79899 Other long term (current) drug therapy: Secondary | ICD-10-CM | POA: Diagnosis not present

## 2017-08-30 DIAGNOSIS — F988 Other specified behavioral and emotional disorders with onset usually occurring in childhood and adolescence: Secondary | ICD-10-CM

## 2017-08-30 DIAGNOSIS — I10 Essential (primary) hypertension: Secondary | ICD-10-CM | POA: Diagnosis not present

## 2017-08-30 MED ORDER — LISDEXAMFETAMINE DIMESYLATE 60 MG PO CAPS: 60.0000 mg | ORAL_CAPSULE | ORAL | 0 refills | Status: DC

## 2017-08-30 NOTE — Assessment & Plan Note (Addendum)
Running high today Pt states it is not normally high at home

## 2017-08-30 NOTE — Assessment & Plan Note (Signed)
Inc vyvanse dose-- f/u 1 month

## 2017-08-30 NOTE — Progress Notes (Signed)
Patient ID: Theresa [name], female   DOB: August 12, 1955, 62 y.o.   MRN: [id number]    Subjective:  I acted as a Education administrator for Dr. Carollee Herter.  [name], [place]   Patient ID: Theresa [name], female    DOB: 06/17/1955, 62 y.o.   MRN: [id number]  Chief Complaint  Patient presents with  . ADHD    HPI  Patient is in today for follow up ADHD.  Pt is under a lot of stress at home.  vyvanse stops working and she would like to inc the dose some.  No side effects experienced.   Patient Care Team: Carollee Herter, Alferd Apa, [name] as PCP - General (Family Medicine)   Past Medical History:  Diagnosis Date  . Breast cancer ([place])    Left breast  . Depression   . GERD (gastroesophageal reflux disease)   . History of blood transfusion   . History of stomach ulcers   . Hypertension   . Migraines   . Pleurisy   . Urinary incontinence     Past Surgical History:  Procedure Laterality Date  . APPENDECTOMY  1960  . BREAST BIOPSY Left 1985  . BREAST RECONSTRUCTION  1986 and 2010   x's 2  . CHOLECYSTECTOMY  1987  . LASIK  2012  . MASTECTOMY  1985    Family History  Problem Relation Age of Onset  . Breast cancer Mother   . Depression Mother   . Heart failure Father   . Depression Father   . Arthritis Maternal Grandmother     Social History   Socioeconomic History  . Marital status: Married    Spouse name: Not on file  . Number of children: Not on file  . Years of education: Not on file  . Highest education level: Not on file  Occupational History  . Occupation: Dealer: [employer]  Social Needs  . Financial resource strain: Not on file  . Food insecurity:    Worry: Not on file    Inability: Not on file  . Transportation needs:    Medical: Not on file    Non-medical: Not on file  Tobacco Use  . Smoking status: Never Smoker  . Smokeless tobacco: Never Used  Substance and Sexual Activity  . Alcohol use: Yes    Comment: occasionally  . Drug use: No  .  Sexual activity: Yes  Lifestyle  . Physical activity:    Days per week: Not on file    Minutes per session: Not on file  . Stress: Not on file  Relationships  . Social connections:    Talks on phone: Not on file    Gets together: Not on file    Attends religious service: Not on file    Active member of club or organization: Not on file    Attends meetings of clubs or organizations: Not on file    Relationship status: Not on file  . Intimate partner violence:    Fear of current or ex partner: Not on file    Emotionally abused: Not on file    Physically abused: Not on file    Forced sexual activity: Not on file  Other Topics Concern  . Not on file  Social History Narrative  . Not on file    Outpatient Medications Prior to Visit  Medication Sig Dispense Refill  . ALPRAZolam (XANAX) 0.5 MG tablet Take 1 tablet (0.5 mg total) by mouth at bedtime as needed  for anxiety. 30 tablet 0  . cyclobenzaprine (FLEXERIL) 5 MG tablet TAKE 1-2 TABLETS (5-10 MG TOTAL) BY MOUTH AT BEDTIME AS NEEDED FOR MUSCLE SPASMS. 30 tablet 1  . fluticasone (FLONASE) 50 MCG/ACT nasal spray Place 2 sprays into both nostrils daily as needed for allergies or rhinitis. 16 g 5  . loratadine (CLARITIN) 10 MG tablet Take 1 tablet (10 mg total) by mouth daily. 90 tablet 1  . meloxicam (MOBIC) 15 MG tablet TAKE 1 TABLET BY MOUTH DAILY 90 tablet 0  . lisdexamfetamine (VYVANSE) 50 MG capsule Take 1 capsule (50 mg total) by mouth daily. 30 capsule 0  . lisdexamfetamine (VYVANSE) 50 MG capsule Take 1 capsule (50 mg total) by mouth daily. 30 capsule 0  . lisdexamfetamine (VYVANSE) 50 MG capsule Take 1 capsule (50 mg total) by mouth daily. 30 capsule 0  . furosemide (LASIX) 20 MG tablet Take 1 tablet (20 mg total) by mouth daily. 30 tablet 1  . tobramycin (TOBREX) 0.3 % ophthalmic solution Place 2 drops into both eyes every 6 (six) hours. 5 mL 0  . zolmitriptan (ZOMIG) 5 MG tablet Take 1 tablet (5 mg total) by mouth as needed  for migraine. 10 tablet 0   No facility-administered medications prior to visit.     Allergies  Allergen Reactions  . Nitroglycerin     Migraine    Review of Systems  Constitutional: Negative for fever and malaise/fatigue.  HENT: Negative for congestion.   Eyes: Negative for blurred vision.  Respiratory: Negative for cough and shortness of breath.   Cardiovascular: Negative for chest pain, palpitations and leg swelling.  Gastrointestinal: Negative for vomiting.  Musculoskeletal: Negative for back pain.  Skin: Negative for rash.  Neurological: Negative for loss of consciousness and headaches.       Objective:    Physical Exam  Constitutional: She is oriented to person, place, and time. She appears well-developed and well-nourished.  HENT:  Head: Normocephalic and atraumatic.  Eyes: Conjunctivae and EOM are normal.  Neck: Normal range of motion. Neck supple. No JVD present. Carotid bruit is not present. No thyromegaly present.  Cardiovascular: Normal rate, regular rhythm and normal heart sounds.  No murmur heard. Pulmonary/Chest: Effort normal and breath sounds normal. No respiratory distress. She has no wheezes. She has no rales. She exhibits no tenderness.  Musculoskeletal: She exhibits no edema.  Neurological: She is alert and oriented to person, place, and time.  Psychiatric: She has a normal mood and affect.  Nursing note and vitals reviewed.   BP (!) 156/96   Pulse 68   Temp 97.7 F (36.5 C) (Oral)   Resp 16   Ht 5\' 4"  (1.626 m)   Wt 194 lb 3.2 oz (88.1 kg)   SpO2 99%   BMI 33.33 kg/m  Wt Readings from Last 3 Encounters:  08/30/17 194 lb 3.2 oz (88.1 kg)  12/25/16 196 lb (88.9 kg)  12/08/16 195 lb (88.5 kg)   BP Readings from Last 3 Encounters:  08/30/17 (!) 156/96  12/25/16 137/86  12/08/16 (!) 130/98     Immunization History  Administered Date(s) Administered  . Zoster 09/28/2014    Health Maintenance  Topic Date Due  . INFLUENZA VACCINE   10/09/2017 (Originally 08/09/2017)  . MAMMOGRAM  12/08/2017 (Originally 06/04/2015)  . PAP SMEAR  12/08/2017 (Originally 06/03/2016)  . COLONOSCOPY  01/10/2020  . TETANUS/TDAP  06/04/2023  . Hepatitis C Screening  Completed  . HIV Screening  Completed    Lab Results  Component Value Date   WBC 5.5 09/28/2014   HGB 13.2 09/28/2014   HCT 39.9 09/28/2014   PLT 247.0 09/28/2014   GLUCOSE 92 09/28/2014   CHOL 185 09/28/2014   TRIG 85.0 09/28/2014   HDL 68.00 09/28/2014   LDLCALC 100 (H) 09/28/2014   ALT 16 09/28/2014   AST 15 09/28/2014   NA 140 09/28/2014   K 3.9 09/28/2014   CL 106 09/28/2014   CREATININE 0.81 09/28/2014   BUN 22 09/28/2014   CO2 24 09/28/2014   TSH 1.50 09/28/2014    Lab Results  Component Value Date   TSH 1.50 09/28/2014   Lab Results  Component Value Date   WBC 5.5 09/28/2014   HGB 13.2 09/28/2014   HCT 39.9 09/28/2014   MCV 84.1 09/28/2014   PLT 247.0 09/28/2014   Lab Results  Component Value Date   NA 140 09/28/2014   K 3.9 09/28/2014   CO2 24 09/28/2014   GLUCOSE 92 09/28/2014   BUN 22 09/28/2014   CREATININE 0.81 09/28/2014   BILITOT 0.3 09/28/2014   ALKPHOS 51 09/28/2014   AST 15 09/28/2014   ALT 16 09/28/2014   PROT 7.2 09/28/2014   ALBUMIN 4.1 09/28/2014   CALCIUM 9.1 09/28/2014   GFR 76.53 09/28/2014   Lab Results  Component Value Date   CHOL 185 09/28/2014   Lab Results  Component Value Date   HDL 68.00 09/28/2014   Lab Results  Component Value Date   LDLCALC 100 (H) 09/28/2014   Lab Results  Component Value Date   TRIG 85.0 09/28/2014   Lab Results  Component Value Date   CHOLHDL 3 09/28/2014   No results found for: HGBA1C       Assessment & Plan:   Problem List Items Addressed This Visit      Unprioritized   Attention deficit disorder - Primary    Inc vyvanse dose-- f/u 1 month      Relevant Medications   lisdexamfetamine (VYVANSE) 60 MG capsule   Other Relevant Orders   Pain Mgmt, Profile 8  w/Conf, U   HTN (hypertension)    Running high today Pt states it is not normally high at home       Other Visit Diagnoses    High risk medication use       Relevant Orders   Pain Mgmt, Profile 8 w/Conf, U      I have discontinued Jullisa A. Pesce's zolmitriptan, furosemide, tobramycin, lisdexamfetamine, lisdexamfetamine, and lisdexamfetamine. I am also having her start on lisdexamfetamine. Additionally, I am having her maintain her meloxicam, fluticasone, ALPRAZolam, loratadine, and cyclobenzaprine.  Meds ordered this encounter  Medications  . lisdexamfetamine (VYVANSE) 60 MG capsule    Sig: Take 1 capsule (60 mg total) by mouth every morning.    Dispense:  30 capsule    Refill:  0    CMA served as scribe during this visit. History, Physical and Plan performed by medical provider. Documentation and orders reviewed and attested to.  Ann Held, [name] \

## 2017-08-30 NOTE — Patient Instructions (Signed)
Hemorrhoids Hemorrhoids are swollen veins in and around the rectum or anus. There are two types of hemorrhoids:  Internal hemorrhoids. These occur in the veins that are just inside the rectum. They may poke through to the outside and become irritated and painful.  External hemorrhoids. These occur in the veins that are outside of the anus and can be felt as a painful swelling or hard lump near the anus.  Most hemorrhoids do not cause serious problems, and they can be managed with home treatments such as diet and lifestyle changes. If home treatments do not help your symptoms, procedures can be done to shrink or remove the hemorrhoids. What are the causes? This condition is caused by increased pressure in the anal area. This pressure may result from various things, including:  Constipation.  Straining to have a bowel movement.  Diarrhea.  Pregnancy.  Obesity.  Sitting for long periods of time.  Heavy lifting or other activity that causes you to strain.  Anal sex.  What are the signs or symptoms? Symptoms of this condition include:  Pain.  Anal itching or irritation.  Rectal bleeding.  Leakage of stool (feces).  Anal swelling.  One or more lumps around the anus.  How is this diagnosed? This condition can often be diagnosed through a visual exam. Other exams or tests may also be done, such as:  Examination of the rectal area with a gloved hand (digital rectal exam).  Examination of the anal canal using a small tube (anoscope).  A blood test, if you have lost a significant amount of blood.  A test to look inside the colon (sigmoidoscopy or colonoscopy).  How is this treated? This condition can usually be treated at home. However, various procedures may be done if dietary changes, lifestyle changes, and other home treatments do not help your symptoms. These procedures can help make the hemorrhoids smaller or remove them completely. Some of these procedures involve  surgery, and others do not. Common procedures include:  Rubber band ligation. Rubber bands are placed at the base of the hemorrhoids to cut off the blood supply to them.  Sclerotherapy. Medicine is injected into the hemorrhoids to shrink them.  Infrared coagulation. A type of light energy is used to get rid of the hemorrhoids.  Hemorrhoidectomy surgery. The hemorrhoids are surgically removed, and the veins that supply them are tied off.  Stapled hemorrhoidopexy surgery. A circular stapling device is used to remove the hemorrhoids and use staples to cut off the blood supply to them.  Follow these instructions at home: Eating and drinking  Eat foods that have a lot of fiber in them, such as whole grains, beans, nuts, fruits, and vegetables. Ask your health care provider about taking products that have added fiber (fiber supplements).  Drink enough fluid to keep your urine clear or pale yellow. Managing pain and swelling  Take warm sitz baths for 20 minutes, 3-4 times a day to ease pain and discomfort.  If directed, apply ice to the affected area. Using ice packs between sitz baths may be helpful. ? Put ice in a plastic bag. ? Place a towel between your skin and the bag. ? Leave the ice on for 20 minutes, 2-3 times a day. General instructions  Take over-the-counter and prescription medicines only as told by your health care provider.  Use medicated creams or suppositories as told.  Exercise regularly.  Go to the bathroom when you have the urge to have a bowel movement. Do not wait.    Avoid straining to have bowel movements.  Keep the anal area dry and clean. Use wet toilet paper or moist towelettes after a bowel movement.  Do not sit on the toilet for long periods of time. This increases blood pooling and pain. Contact a health care provider if:  You have increasing pain and swelling that are not controlled by treatment or medicine.  You have uncontrolled bleeding.  You  have difficulty having a bowel movement, or you are unable to have a bowel movement.  You have pain or inflammation outside the area of the hemorrhoids. This information is not intended to replace advice given to you by your health care provider. Make sure you discuss any questions you have with your health care provider. Document Released: 12/24/1999 Document Revised: 05/26/2015 Document Reviewed: 09/09/2014 Elsevier Interactive Patient Education  2018 Elsevier Inc.  

## 2017-09-01 LAB — PAIN MGMT, PROFILE 8 W/CONF, U
6 ACETYLMORPHINE: NEGATIVE ng/mL (ref <10)
ALCOHOL METABOLITES: NEGATIVE ng/mL (ref <500)
Amphetamine: 4896 ng/mL — ABNORMAL HIGH (ref <250)
Amphetamines: POSITIVE ng/mL — AB (ref <500)
Benzodiazepines: NEGATIVE ng/mL (ref <100)
Buprenorphine, Urine: NEGATIVE ng/mL (ref <5)
COCAINE METABOLITE: NEGATIVE ng/mL (ref <150)
CREATININE: 36.8 mg/dL
MDMA: NEGATIVE ng/mL (ref <500)
Marijuana Metabolite: NEGATIVE ng/mL (ref <20)
Methamphetamine: NEGATIVE ng/mL (ref <250)
OXIDANT: NEGATIVE ug/mL (ref <200)
OXYCODONE: NEGATIVE ng/mL (ref <100)
Opiates: NEGATIVE ng/mL (ref <100)
pH: 6.64 (ref 4.5–9.0)

## 2017-09-02 ENCOUNTER — Other Ambulatory Visit: Payer: Self-pay | Admitting: Family Medicine

## 2017-09-02 DIAGNOSIS — J302 Other seasonal allergic rhinitis: Secondary | ICD-10-CM

## 2017-09-24 ENCOUNTER — Other Ambulatory Visit: Payer: Self-pay | Admitting: Family Medicine

## 2017-09-24 DIAGNOSIS — F988 Other specified behavioral and emotional disorders with onset usually occurring in childhood and adolescence: Secondary | ICD-10-CM

## 2017-09-24 MED ORDER — LISDEXAMFETAMINE DIMESYLATE 60 MG PO CAPS: 60.0000 mg | ORAL_CAPSULE | ORAL | 0 refills | Status: DC

## 2017-09-24 NOTE — Telephone Encounter (Signed)
Refill of vyvanse  LOV 08/30/17 Dr. Carollee Herter  LRF 08/30/17  #30  0 refills   [id number] [place] [place] - [address]           [phone] (Phone) [phone] (Fax)

## 2017-09-24 NOTE — Telephone Encounter (Signed)
Copied from [place] [phone]. Topic: Quick Communication - Rx Refill/Question >> Sep 24, 2017  8:25 AM Mylinda Latina, NT wrote: Medication: lisdexamfetamine (VYVANSE) 60 MG capsule  Has the patient contacted their pharmacy? No. (Agent: If no, request that the patient contact the pharmacy for the refill.) (Agent: If yes, when and what did the pharmacy advise?)  Preferred Pharmacy (with phone number or street name): [id number] [place], [place] [phone] (Phone) [phone] (Fax)    Agent: Please be advised that RX refills may take up to 3 business days. We ask that you follow-up with your pharmacy.

## 2017-10-30 ENCOUNTER — Telehealth: Payer: Self-pay | Admitting: Family Medicine

## 2017-10-30 ENCOUNTER — Other Ambulatory Visit: Payer: Self-pay | Admitting: Family Medicine

## 2017-10-30 DIAGNOSIS — M791 Myalgia, unspecified site: Secondary | ICD-10-CM

## 2017-10-30 DIAGNOSIS — F988 Other specified behavioral and emotional disorders with onset usually occurring in childhood and adolescence: Secondary | ICD-10-CM

## 2017-10-30 MED ORDER — CYCLOBENZAPRINE HCL 5 MG PO TABS: 5.0000 mg | ORAL_TABLET | Freq: Every evening | ORAL | 1 refills | Status: DC | PRN

## 2017-10-30 MED ORDER — LISDEXAMFETAMINE DIMESYLATE 60 MG PO CAPS: 60.0000 mg | ORAL_CAPSULE | ORAL | 0 refills | Status: DC

## 2017-10-30 MED ORDER — MELOXICAM 15 MG PO TABS: 15.0000 mg | ORAL_TABLET | Freq: Every day | ORAL | 0 refills | Status: DC

## 2017-10-30 NOTE — Telephone Encounter (Signed)
Database ran and is on your desk for review.  Last filled per database: 09/28/17 Last written: 09/24/17 Last ov: 08/30/17 Next ov: 03/07/17 Contract: 01/23/18 UDS: 03/02/18   meloxicam and cyclobenzaprine filled

## 2017-10-30 NOTE — Telephone Encounter (Signed)
Copied from [place] [phone]. Topic: Quick Communication - Rx Refill/Question >> Oct 30, 2017  9:12 AM Margot Ables wrote: Medication: lisdexamfetamine (VYVANSE) 60 MG capsule  - pt has 3-4 left, takes 1/day meloxicam (MOBIC) 15 MG tablet - 2 left, uses prn cyclobenzaprine (FLEXERIL) 5 MG tablet - out, uses prn  Has the patient contacted their pharmacy? No - controlled Preferred Pharmacy (with phone number or street name): [id number] [place], [place] [phone] (Phone) [phone] (Fax)

## 2017-11-24 ENCOUNTER — Other Ambulatory Visit: Payer: Self-pay | Admitting: Family Medicine

## 2017-11-24 DIAGNOSIS — M791 Myalgia, unspecified site: Secondary | ICD-10-CM

## 2017-11-26 ENCOUNTER — Other Ambulatory Visit: Payer: Self-pay | Admitting: Family Medicine

## 2017-11-26 DIAGNOSIS — F988 Other specified behavioral and emotional disorders with onset usually occurring in childhood and adolescence: Secondary | ICD-10-CM

## 2017-11-26 NOTE — Telephone Encounter (Signed)
Copied from [place] [id number]. Topic: Quick Communication - Rx Refill/Question >> Nov 26, 2017 11:03 AM Carolyn Stare wrote: Medication   lisdexamfetamine (VYVANSE) 60 MG capsule  Has the patient contacted their pharmacy yes   ( [facility]erred [facility]acy  [facility]   Agent: Please be advised that RX refills may take up to 3 business days. We ask that you follow-up with your pharmacy.

## 2017-11-27 MED ORDER — LISDEXAMFETAMINE DIMESYLATE 60 MG PO CAPS: 60.0000 mg | ORAL_CAPSULE | ORAL | 0 refills | Status: DC

## 2017-11-27 NOTE — Telephone Encounter (Signed)
Requested medication (s) are due for refill today: yes  Requested medication (s) are on the active medication list: yes  Last refill:  10/30/17 #30  Future visit scheduled: yes  Notes to clinic:  LOV on 08/30/17    Requested Prescriptions  Pending Prescriptions Disp Refills   lisdexamfetamine (VYVANSE) 60 MG capsule 30 capsule 0    Sig: Take 1 capsule (60 mg total) by mouth every morning.     Not Delegated - Psychiatry:  Stimulants/ADHD Failed - 11/27/2017  8:56 AM      Failed - This refill cannot be delegated      Failed - Urine Drug Screen completed in last 360 days.      Passed - Valid encounter within last 3 months    Recent Outpatient Visits          2 months ago Attention deficit disorder, unspecified hyperactivity presence   [facility] at [place], [place]   11 months ago Acute bacterial conjunctivitis of both eyes   [facility] at [facility], [place]   11 months ago Attention deficit disorder, unspecified hyperactivity presence   [facility] at [place], [place]   1 year ago Attention deficit disorder, unspecified hyperactivity presence   [facility] at [place], [place]   1 year ago Visit for suture removal   [facility] at [facility], [place], [place]      Future Appointments            In 3 months [place], [place] at [employer] [facility], [facility]

## 2017-12-24 ENCOUNTER — Telehealth: Payer: Self-pay | Admitting: Family Medicine

## 2017-12-24 DIAGNOSIS — F411 Generalized anxiety disorder: Secondary | ICD-10-CM

## 2017-12-24 DIAGNOSIS — J302 Other seasonal allergic rhinitis: Secondary | ICD-10-CM

## 2017-12-24 MED ORDER — LORATADINE 10 MG PO TABS: 10.0000 mg | ORAL_TABLET | Freq: Every day | ORAL | 1 refills | Status: DC

## 2017-12-24 MED ORDER — ALPRAZOLAM 0.5 MG PO TABS: 0.5000 mg | ORAL_TABLET | Freq: Every evening | ORAL | 0 refills | Status: DC | PRN

## 2017-12-24 NOTE — Telephone Encounter (Signed)
Requested medication (s) are due for refill today: Yes  Requested medication (s) are on the active medication list: Yes  Last refill:  03/13/17  Future visit scheduled: Yes  Notes to clinic:  See request    Requested Prescriptions  Pending Prescriptions Disp Refills   ALPRAZolam (XANAX) 0.5 MG tablet 30 tablet 0    Sig: Take 1 tablet (0.5 mg total) by mouth at bedtime as needed for anxiety.     Not Delegated - Psychiatry:  Anxiolytics/Hypnotics Failed - 12/24/2017 11:02 AM      Failed - This refill cannot be delegated      Failed - Urine Drug Screen completed in last 360 days.      Passed - Valid encounter within last 6 months    Recent Outpatient Visits          3 months ago Attention deficit disorder, unspecified hyperactivity presence   [facility] at [place], [place]   12 months ago Acute bacterial conjunctivitis of both eyes   [facility] at [place], [place]   1 year ago Attention deficit disorder, unspecified hyperactivity presence   [facility] at [place], [place]   1 year ago Attention deficit disorder, unspecified hyperactivity presence   [facility] at [place], [place]   1 year ago Visit for suture removal   [facility] at [facility], [place], [place]      Future Appointments            In 2 months [place], [place] at [facility], [place]         Signed Prescriptions Disp Refills   loratadine (CLARITIN) 10 MG tablet 90 tablet 1    Sig: Take 1 tablet (10 mg total) by mouth daily.     Ear, Nose, and Throat:  Antihistamines Passed - 12/24/2017 11:02 AM      Passed - Valid encounter within last 12 months    Recent Outpatient Visits          3 months ago Attention deficit disorder, unspecified  hyperactivity presence   [facility] at [place], [place]   12 months ago Acute bacterial conjunctivitis of both eyes   Archivist at [place], [place]   1 year ago Attention deficit disorder, unspecified hyperactivity presence   Archivist at [place], [place]   1 year ago Attention deficit disorder, unspecified hyperactivity presence   Archivist at [place], [place]   1 year ago Visit for suture removal   [facility] at [facility], [place], [place]      Future Appointments            In 2 months [place], [place] at [facility], [facility]

## 2017-12-24 NOTE — Telephone Encounter (Signed)
Copied from [place] [id number]. Topic: Quick Communication - See Telephone Encounter >> Dec 24, 2017  9:30 AM Loma Boston wrote: CRM for notification. See Telephone encounter for: 12/24/17. loratadine (CLARITIN) 10 MG tablet /ALPRAZolam (XANAX) 0.5 MG tablet

## 2017-12-28 ENCOUNTER — Other Ambulatory Visit: Payer: Self-pay | Admitting: Family Medicine

## 2017-12-28 DIAGNOSIS — F988 Other specified behavioral and emotional disorders with onset usually occurring in childhood and adolescence: Secondary | ICD-10-CM

## 2017-12-28 MED ORDER — LISDEXAMFETAMINE DIMESYLATE 60 MG PO CAPS: 60.0000 mg | ORAL_CAPSULE | ORAL | 0 refills | Status: DC

## 2017-12-28 NOTE — Telephone Encounter (Signed)
Patient called in and stated she requested her refill of lisdexamfetamine (VYVANSE) 60 MG capsule with her other request. She states she is completely out and she needs it.

## 2017-12-28 NOTE — Telephone Encounter (Signed)
Database ran on 10/30/17 and is in media for review  Last written: 11/27/17 Last ov: 08/30/17 Next ov: 03/07/18 Contract: 01/23/18 UDS: 03/02/18

## 2017-12-28 NOTE — Telephone Encounter (Signed)
done

## 2017-12-31 ENCOUNTER — Other Ambulatory Visit: Payer: Self-pay | Admitting: Family Medicine

## 2017-12-31 DIAGNOSIS — F411 Generalized anxiety disorder: Secondary | ICD-10-CM

## 2017-12-31 MED ORDER — ALPRAZOLAM 0.5 MG PO TABS: 0.5000 mg | ORAL_TABLET | Freq: Every evening | ORAL | 0 refills | Status: DC | PRN

## 2017-12-31 NOTE — Telephone Encounter (Signed)
It looks like the alprazolam was printed on the 16th and not sent in.  Can you resend electronically?

## 2018-01-22 ENCOUNTER — Other Ambulatory Visit: Payer: Self-pay | Admitting: Family Medicine

## 2018-01-28 ENCOUNTER — Other Ambulatory Visit: Payer: Self-pay | Admitting: Family Medicine

## 2018-01-28 DIAGNOSIS — F988 Other specified behavioral and emotional disorders with onset usually occurring in childhood and adolescence: Secondary | ICD-10-CM

## 2018-01-28 NOTE — Telephone Encounter (Signed)
Copied from [place] [phone]. Topic: Quick Communication - Rx Refill/Question >> Jan 28, 2018  8:26 AM Rayann Heman wrote: Medication: lisdexamfetamine (VYVANSE) 60 MG capsule [[id number]]   Has the patient contacted their pharmacy?no/controlled  Preferred Pharmacy (with phone number or street name): [id number] [place], [place] [phone] (Phone) [phone] (Fax)   Agent: Please be advised that RX refills may take up to 3 business days. We ask that you follow-up with your pharmacy.

## 2018-01-29 MED ORDER — LISDEXAMFETAMINE DIMESYLATE 60 MG PO CAPS: 60.0000 mg | ORAL_CAPSULE | ORAL | 0 refills | Status: DC

## 2018-01-29 NOTE — Telephone Encounter (Signed)
Database ran and is on your desk for review.  Last filled per database: 12/28/17 Last written: 12/28/17 Last ov: 08/30/17 Next ov: 03/07/17 Contract: 03/07/18 UDS: 03/02/18

## 2018-02-22 ENCOUNTER — Telehealth: Payer: Self-pay | Admitting: Family Medicine

## 2018-02-22 NOTE — Telephone Encounter (Signed)
Pt came into office and requested a refill on Vyvanse.  [facility] [place]. Pt has a f/u appt scheduled for 03/07/18 w/ Dr. Carollee Herter.

## 2018-02-26 ENCOUNTER — Other Ambulatory Visit: Payer: Self-pay | Admitting: Family Medicine

## 2018-02-26 DIAGNOSIS — F988 Other specified behavioral and emotional disorders with onset usually occurring in childhood and adolescence: Secondary | ICD-10-CM

## 2018-02-26 MED ORDER — LISDEXAMFETAMINE DIMESYLATE 60 MG PO CAPS: 60.0000 mg | ORAL_CAPSULE | ORAL | 0 refills | Status: DC

## 2018-02-26 NOTE — Telephone Encounter (Signed)
#  30 sent in

## 2018-02-26 NOTE — Telephone Encounter (Signed)
Patient notified that medication has been sent in. 

## 2018-02-26 NOTE — Telephone Encounter (Signed)
Last written: 01/29/18 Last ov: 08/30/17 Next ov: 03/07/18 Contract: 08/31/18 UDS: 03/02/18

## 2018-03-07 ENCOUNTER — Ambulatory Visit: Payer: BC Managed Care – PPO | Admitting: Family Medicine

## 2018-03-07 ENCOUNTER — Encounter: Payer: Self-pay | Admitting: Family Medicine

## 2018-03-07 VITALS — BP 120/84 | HR 99 | Resp 14 | Ht 64.0 in | Wt 194.0 lb

## 2018-03-07 DIAGNOSIS — K5901 Slow transit constipation: Secondary | ICD-10-CM | POA: Insufficient documentation

## 2018-03-07 DIAGNOSIS — M79601 Pain in right arm: Secondary | ICD-10-CM

## 2018-03-07 DIAGNOSIS — F988 Other specified behavioral and emotional disorders with onset usually occurring in childhood and adolescence: Secondary | ICD-10-CM | POA: Diagnosis not present

## 2018-03-07 MED ORDER — LISDEXAMFETAMINE DIMESYLATE 70 MG PO CAPS: 70.0000 mg | ORAL_CAPSULE | Freq: Every day | ORAL | 0 refills | Status: DC

## 2018-03-07 MED ORDER — METHOCARBAMOL 500 MG PO TABS: 500.0000 mg | ORAL_TABLET | Freq: Four times a day (QID) | ORAL | 1 refills | Status: DC

## 2018-03-07 NOTE — Assessment & Plan Note (Signed)
Drink plenty of water miralax prn Probiotics

## 2018-03-07 NOTE — Assessment & Plan Note (Signed)
Muscle relaxers Consider pred taper --- pt wanted to wait Consider sport med/ ortho

## 2018-03-07 NOTE — Progress Notes (Signed)
Patient ID: Theresa [name], female    DOB: 1955/10/23  Age: 63 y.o. MRN: [id number]    Subjective:  Subjective  HPI Theresa [name] presents for f/u add  She c/o constipation and has used otc with no relief She also c/o r arm pain --- no known injury except she is R handed and uses it everyday.    Review of Systems  Constitutional: Negative for appetite change, diaphoresis, fatigue and unexpected weight change.  Eyes: Negative for pain, redness and visual disturbance.  Respiratory: Negative for cough, chest tightness, shortness of breath and wheezing.   Cardiovascular: Negative for chest pain, palpitations and leg swelling.  Gastrointestinal: Positive for constipation. Negative for diarrhea, nausea, rectal pain and vomiting.  Endocrine: Negative for cold intolerance, heat intolerance, polydipsia, polyphagia and polyuria.  Genitourinary: Negative for difficulty urinating, dysuria and frequency.  Neurological: Negative for dizziness, light-headedness, numbness and headaches.    History Past Medical History:  Diagnosis Date  . Breast cancer ([place])    Left breast  . Depression   . GERD (gastroesophageal reflux disease)   . History of blood transfusion   . History of stomach ulcers   . Hypertension   . Migraines   . Pleurisy   . Urinary incontinence     She has a past surgical history that includes Cholecystectomy (1987); Appendectomy (1960); Breast biopsy (Left, 1985); Mastectomy (1985); Breast reconstruction (1986 and 2010); and LASIK (2012).   Her family history includes Arthritis in her maternal grandmother; Breast cancer in her mother; Depression in her father and mother; Heart failure in her father.She reports that she has never smoked. She has never used smokeless tobacco. She reports current alcohol use. She reports that she does not use drugs.  Current Outpatient Medications on File Prior to Visit  Medication Sig Dispense Refill  . ALPRAZolam (XANAX) 0.5 MG tablet Take 1  tablet (0.5 mg total) by mouth at bedtime as needed for anxiety. 30 tablet 0  . fluticasone (FLONASE) 50 MCG/ACT nasal spray Place 2 sprays into both nostrils daily as needed for allergies or rhinitis. 16 g 5  . loratadine (CLARITIN) 10 MG tablet Take 1 tablet (10 mg total) by mouth daily. 90 tablet 1  . meloxicam (MOBIC) 15 MG tablet TAKE 1 TABLET BY MOUTH EVERY DAY 90 tablet 1   No current facility-administered medications on file prior to visit.      Objective:  Objective  Physical Exam Vitals signs and nursing note reviewed.  Constitutional:      Appearance: She is well-developed.  HENT:     Head: Normocephalic and atraumatic.  Eyes:     Conjunctiva/sclera: Conjunctivae normal.  Neck:     Musculoskeletal: Normal range of motion and neck supple.     Thyroid: No thyromegaly.     Vascular: No carotid bruit or JVD.  Cardiovascular:     Rate and Rhythm: Normal rate and regular rhythm.     Heart sounds: Normal heart sounds. No murmur.  Pulmonary:     Effort: Pulmonary effort is normal. No respiratory distress.     Breath sounds: Normal breath sounds. No wheezing or rales.  Chest:     Chest wall: No tenderness.  Musculoskeletal:        General: Tenderness present.     Right elbow: She exhibits normal range of motion, no swelling, no effusion, no deformity and no laceration. Tenderness found.       Arms:  Neurological:     Mental Status: She is  alert and oriented to person, place, and time.    BP 120/84 (BP Location: Left Arm, Patient Position: Sitting, Cuff Size: Normal)   Pulse 99   Resp 14   Ht 5\' 4"  (1.626 m)   Wt 194 lb (88 kg)   SpO2 96%   BMI 33.30 kg/m  Wt Readings from Last 3 Encounters:  03/07/18 194 lb (88 kg)  08/30/17 194 lb 3.2 oz (88.1 kg)  12/25/16 196 lb (88.9 kg)     Lab Results  Component Value Date   WBC 5.5 09/28/2014   HGB 13.2 09/28/2014   HCT 39.9 09/28/2014   PLT 247.0 09/28/2014   GLUCOSE 92 09/28/2014   CHOL 185 09/28/2014   TRIG  85.0 09/28/2014   HDL 68.00 09/28/2014   LDLCALC 100 (H) 09/28/2014   ALT 16 09/28/2014   AST 15 09/28/2014   NA 140 09/28/2014   K 3.9 09/28/2014   CL 106 09/28/2014   CREATININE 0.81 09/28/2014   BUN 22 09/28/2014   CO2 24 09/28/2014   TSH 1.50 09/28/2014    Mr Brain W Wo Contrast  Result Date: 04/10/2014 CLINICAL DATA:  Severe frontal headaches.  History breast cancer. BUN and creatinine were obtained on site at [facility] at [address] Results:  BUN 23 mg/dL,  Creatinine 0.9 mg/dL. EXAM: MRI HEAD WITHOUT AND WITH CONTRAST TECHNIQUE: Multiplanar, multiecho pulse sequences of the brain and surrounding structures were obtained without and with intravenous contrast. CONTRAST:  20 mL MultiHance IV COMPARISON:  None. FINDINGS: Ventricle size is normal. Cerebral volume is normal. Pituitary normal in size. Craniocervical junction normal. Calvarium intact. Negative for acute or chronic ischemia Negative for demyelinating disease. Cerebral white matter normal. Brainstem and basal ganglia normal. Negative for intracranial hemorrhage Negative for mass or edema Normal enhancement following contrast administration. No enhancing mass lesion. Leptomeningeal enhancement is normal. Paranasal sinuses are clear.  Bilateral mastoid sinus effusion. IMPRESSION: Normal MRI of the brain with contrast Bilateral mastoid sinus effusion. Electronically Signed   By: Franchot Gallo M.D.   On: 04/10/2014 10:19     Assessment & Plan:  Plan  I have discontinued Theresa [name]'s cyclobenzaprine and lisdexamfetamine. I am also having her start on methocarbamol and lisdexamfetamine. Additionally, I am having her maintain her fluticasone, loratadine, ALPRAZolam, and meloxicam.  Meds ordered this encounter  Medications  . methocarbamol (ROBAXIN) 500 MG tablet    Sig: Take 1 tablet (500 mg total) by mouth 4 (four) times daily.    Dispense:  30 tablet    Refill:  1  . lisdexamfetamine (VYVANSE) 70 MG  capsule    Sig: Take 1 capsule (70 mg total) by mouth daily.    Dispense:  30 capsule    Refill:  0    Problem List Items Addressed This Visit      Unprioritized   Attention deficit disorder (ADD) in adult - Primary    con't meds stable      Relevant Medications   lisdexamfetamine (VYVANSE) 70 MG capsule   Right arm pain    Muscle relaxers Consider pred taper --- pt wanted to wait Consider sport med/ ortho      Relevant Medications   methocarbamol (ROBAXIN) 500 MG tablet   Slow transit constipation    Drink plenty of water miralax prn Probiotics           Follow-up: Return in about 6 months (around 09/05/2018), or if symptoms worsen or fail to improve, for add.  Ann Held, [name]

## 2018-03-07 NOTE — Assessment & Plan Note (Signed)
con't meds stable 

## 2018-03-07 NOTE — Patient Instructions (Signed)
Attention Deficit Hyperactivity Disorder, Adult Attention deficit hyperactivity disorder (ADHD) is a mental health disorder that starts during childhood. For many people with ADHD, the disorder continues into adult years. There are many things that you and your health care provider or therapist (mental health professional) can do to manage your symptoms. What are the causes? The exact cause of ADHD is not known. What increases the risk? You are more likely to develop this condition if:  You have a family history of ADHD.  You are female.  You were born to a mother who smoked or drank alcohol during pregnancy.  You were exposed to lead poisoning or other toxins in the womb or in early life.  You were born before 37 weeks of pregnancy (prematurely) or you had a low birth weight.  You have experienced a brain injury. What are the signs or symptoms? Symptoms of this condition depend on the type of ADHD. The two main types are inattentive and hyperactive-impulsive. Some people may have symptoms of both types. Symptoms of the inattentive type include:  Difficulty watching, listening, or thinking with focused effort (paying attention).  Making careless mistakes.  Not listening.  Not following instructions.  Being disorganized.  Avoiding tasks that require time and attention.  Losing things.  Forgetting things.  Being easily distracted. Symptoms of the hyperactive-impulsive type include:  Restlessness.  Talking too much.  Interrupting.  Difficulty with: ? Sitting still. ? Staying quiet. ? Feeling motivated. ? Relaxing. ? Waiting in line or waiting for a turn. How is this diagnosed? This condition is diagnosed based on your current symptoms and your history of symptoms. The diagnosis can be made by a provider such as a primary care provider, psychiatrist, psychologist, or clinical social worker. The provider may use a symptom checklist or a standardized behavior rating  scale to evaluate your symptoms. He or she may want to talk with family members who have known you for a long time and have observed your behaviors. There are no lab tests or brain imaging tests that can diagnose ADHD. How is this treated? This condition can be treated with medicines and behavior therapy. Medicines may be the best option to reduce impulsive behaviors and improve attention. Your health care provider may recommend:  Stimulant medicines. These are the most common medicines used for adult ADHD. They affect certain chemicals in the brain (neurotransmitters). These medicines may be long-acting or short-acting. This will determine how often you need to take the medicine.  A non-stimulant medicine for adult ADHD (atomoxetine). This medicine increases a neurotransmitter called norepinephrine. It may take weeks to months to see effects from this medicine. Psychotherapy and behavioral management are also important for treating ADHD. Psychotherapy is often used along with medicine. Your health care provider may suggest:  Cognitive behavioral therapy (CBT). This type of therapy teaches you to replace negative thoughts and actions with positive thoughts and actions. When used as part of ADHD treatment, this therapy may also include: ? Coping strategies for organization, time management, impulse control, and stress reduction. ? Mindfulness and meditation training.  Behavioral management. This may include strategies for organization and time management. You may work with an ADHD coach who is specially trained to help people with ADHD to manage and organize activities and to function more effectively. Follow these instructions at home: Medicines   Take over-the-counter and prescription medicines only as told by your health care provider.  Talk with your health care provider about the possible side effects of your   medicine to watch for. General instructions   Learn as much as you can about  adult ADHD, and work closely with your health care providers to find the treatments that work best for you.  Do not use drugs or abuse alcohol. Limit alcohol intake to no more than 1 drink a day for nonpregnant women and 2 drinks a day for men. One drink equals 12 oz of beer, 5 oz of wine, or 1 oz of hard liquor.  Follow the same schedule each day. Make sure your schedule includes enough time for you to get plenty of sleep.  Use reminder devices like notes, calendars, and phone apps to stay on-time and organized.  Eat a healthy diet. Do not skip meals.  Exercise regularly. Exercise can help to reduce stress and anxiety.  Keep all follow-up visits as told by your health care provider and therapist. This is important. Where to find more information  A health care provider may be able to recommend resources that are available online or over the phone. You could start with: ? Attention Deficit Disorder Association (ADDA): www.add.org ? National Institute of Mental Health (NIMH): www.nimh.nih.gov Contact a health care provider if:  Your symptoms are changing, getting worse, or not improving.  You have side effects from your medicine, such as: ? Repeated muscle twitches, coughing, or speech outbursts. ? Sleep problems. ? Loss of appetite. ? Depression. ? New or worsening behavior problems. ? Dizziness. ? Unusually fast heartbeat. ? Stomach pains. ? Headaches.  You are struggling with anxiety, depression, or substance abuse. Get help right away if:  You have a severe reaction to a medicine.  You have thoughts of hurting yourself or others. If you ever feel like you may hurt yourself or others, or have thoughts about taking your own life, get help right away. You can go to the nearest emergency department or call:  Your local emergency services (911 in the [place]  A suicide crisis helpline, such as the National Suicide Prevention Lifeline at 1-800-273-8255. This is open 24 hours a  day. Summary  ADHD is a mental health disorder that starts during childhood and often continues into adult years.  The exact cause of ADHD is not known.  There is no cure for ADHD, but treatment with medicine, therapy, or behavioral training can help you manage your condition. This information is not intended to replace advice given to you by your health care provider. Make sure you discuss any questions you have with your health care provider. Document Released: 08/17/2016 Document Revised: 08/17/2016 Document Reviewed: 08/17/2016 Elsevier Interactive Patient Education  2019 Elsevier Inc.  

## 2018-04-06 ENCOUNTER — Other Ambulatory Visit: Payer: Self-pay | Admitting: Family Medicine

## 2018-04-06 NOTE — Telephone Encounter (Signed)
Please advise on refill request

## 2018-04-09 MED ORDER — LISDEXAMFETAMINE DIMESYLATE 60 MG PO CAPS: 60.0000 mg | ORAL_CAPSULE | ORAL | 0 refills | Status: DC

## 2018-04-09 NOTE — Telephone Encounter (Signed)
Last written: 03/07/18 Last ov: 03/07/18 Next ov: 09/06/18 Contract: 03/09/19 UDS: 09/06/18

## 2018-05-14 ENCOUNTER — Telehealth: Payer: Self-pay | Admitting: Family Medicine

## 2018-05-14 NOTE — Telephone Encounter (Signed)
Copied from [place] [id number]. Topic: Quick Communication - Rx Refill/Question >> May 14, 2018  1:08 PM Keene Breath wrote: Medication: ALPRAZolam [name]) 0.5 MG tablet / lisdexamfetamine (VYVANSE) 60 MG capsule  Patient called to request a refill  Preferred Pharmacy (with phone number or street name[id number] [place], [place] [phone] (Phone) [phone] (Fax)

## 2018-05-15 ENCOUNTER — Other Ambulatory Visit: Payer: Self-pay

## 2018-05-15 DIAGNOSIS — F411 Generalized anxiety disorder: Secondary | ICD-10-CM

## 2018-05-15 MED ORDER — ALPRAZOLAM 0.5 MG PO TABS: 0.5000 mg | ORAL_TABLET | Freq: Every evening | ORAL | 0 refills | Status: DC | PRN

## 2018-05-15 MED ORDER — LISDEXAMFETAMINE DIMESYLATE 60 MG PO CAPS: 60.0000 mg | ORAL_CAPSULE | ORAL | 0 refills | Status: DC

## 2018-05-15 NOTE — Telephone Encounter (Signed)
Refill request sent to provider.

## 2018-05-15 NOTE — Telephone Encounter (Signed)
Requesting: Xanax Contract:03/08/2018 UDS: 08/30/2017, low risk, next screen 03/02/18 Last OV:03/07/2018  Next OV: 09/06/2018 Last Refill: 12/31/2017, #30--0 RF Database:   Requesting: Vyvanse Contract: 03/08/2018 UDS:08/30/2017, low risk, next screen 03/02/2018 Last OV: 03/07/2018 Next OV: 09/06/2018 Last Refill: 04/09/2018, #90--0 RF Database:   Please advise

## 2018-05-17 ENCOUNTER — Telehealth: Payer: Self-pay

## 2018-05-17 NOTE — Telephone Encounter (Signed)
PA initiated via Covermymeds; KEY: [id number]. Awaiting determination.

## 2018-05-28 NOTE — Telephone Encounter (Signed)
Called [facility] at [phone]- spoke w/ Phineas Real- informed I was checking on status of PA that was submitted on 05/17/2018. Informed that PA was cancelled/aborted on their end because Pt is in criteria of less than 90 capsules in 75 day period. Vyvanse was picked up at pharmacy on 05/20/2018.

## 2018-06-10 ENCOUNTER — Ambulatory Visit: Payer: Self-pay

## 2018-06-10 NOTE — Telephone Encounter (Signed)
Pt c/o feelings of sadness, lack of motivation, increased stress,decreased energy,hopelessness, stressed and upset about current events that are occurring in the country (pandemic, riots, unemployment.) - Pt denies suicidal and homicidal ideation. Pt has a good support system and lives with her husband of 82 years. Pt repeated that "things are so out of control in the world right now." Pt drinks a glass of wine per week. No illicit drug use. Pt was tearful occasionally during triage call. Pt offered hope and encouragement.  Pt provided the number to [facility] Pt does know of a therapist she stated she thought would be "a good fit." Care advice given and pt verbalized understanding. Pt warm transferred to [place] at office.    Reason for Disposition . [1] Depression AND [2] worsening (e.g.,sleeping poorly, less able to do activities of daily living)  Answer Assessment - Initial Assessment Questions 1. CONCERN: "What happened that made you call today?"     Feeling sad  And tearful- not keeping up the the days of the week- feel slike there is no direction- lack of motivation 2. DEPRESSION SYMPTOM SCREENING: "How are you feeling overall?" (e.g., decreased energy, increased sleeping or difficulty sleeping, difficulty concentrating, feelings of sadness, guilt, hopelessness, or worthlessness)     Feels like things are getting out of control, more stressed, decreased energy, lack of motivation, "fibrofog", hopeless, sadness, feeling bad about current events 3. RISK OF HARM - SUICIDAL IDEATION:  "Do you ever have thoughts of hurting or killing yourself?"  (e.g., yes, no, no but preoccupation with thoughts about death)   - INTENT:  "Do you have thoughts of hurting or killing yourself right NOW?" (e.g., yes, no, N/A)   - PLAN: "Do you have a specific plan for how you would do this?" (e.g., gun, knife, overdose, no plan, N/A)     no 4. RISK OF HARM - HOMICIDAL IDEATION:  "Do you ever have  thoughts of hurting or killing someone else?"  (e.g., yes, no, no but preoccupation with thoughts about death)   - INTENT:  "Do you have thoughts of hurting or killing someone right NOW?" (e.g., yes, no, N/A)   - PLAN: "Do you have a specific plan for how you would do this?" (e.g., gun, knife, no plan, N/A)      no 5. FUNCTIONAL IMPAIRMENT: "How have things been going for you overall in your life? Have you had any more difficulties than usual doing your normal daily activities?"  (e.g., better, same, worse; self-care, school, work, interactions)     Stress level has increased, having personal issues with daughter and has not seen grandchildren in 2 years, feels a lack of motivation 6. SUPPORT: "Who is with you now?" "Who do you live with?" "Do you have family or friends nearby who you can talk to?"      No- husband of 75 years and 48 year old granddaughter, friends and family a good support system 7. THERAPIST: "Do you have a counselor or therapist? Name?"    no 8. STRESSORS: "Has there been any new stress or recent changes in your life?"     Current events unemployment in the country, personal family issues 9. DRUG ABUSE/ALCOHOL: "Do you drink alcohol or use any illegal drugs?"     Glass of wine once a week 10. OTHER: "Do you have any other health or medical symptoms right now?" (e.g., fever)       PT for neck pain that worsens with stress, fibromyalgia 11. PREGNANCY: "  Is there any chance you are pregnant?" "When was your last menstrual period?"       n/a  Protocols used: DEPRESSION-A-AH

## 2018-06-11 ENCOUNTER — Other Ambulatory Visit: Payer: Self-pay | Admitting: Family Medicine

## 2018-06-11 ENCOUNTER — Ambulatory Visit (INDEPENDENT_AMBULATORY_CARE_PROVIDER_SITE_OTHER): Payer: BC Managed Care – PPO | Admitting: Family Medicine

## 2018-06-11 ENCOUNTER — Other Ambulatory Visit: Payer: Self-pay

## 2018-06-11 ENCOUNTER — Encounter: Payer: Self-pay | Admitting: Family Medicine

## 2018-06-11 DIAGNOSIS — F321 Major depressive disorder, single episode, moderate: Secondary | ICD-10-CM

## 2018-06-11 MED ORDER — BUPROPION HCL ER (XL) 150 MG PO TB24: 150.0000 mg | ORAL_TABLET | Freq: Every day | ORAL | 2 refills | Status: DC

## 2018-06-11 NOTE — Progress Notes (Signed)
Virtual Visit via Video Note  I connected with Theresa [name] on 06/11/18 at  8:30 AM EDT by a video enabled telemedicine application and verified that I am speaking with the correct person using two identifiers.  Location: Patient: home Provider: office    I discussed the limitations of evaluation and management by telemedicine and the availability of in person appointments. The patient expressed understanding and agreed to proceed.  History of Present Illness: Pt is home c/o increasing depression -- she is crying all the time and constantly worried about covid and the riots etc and other things going on at home.     Observations/Objective: No vitals obtained  Pt in NAD Pt is not suicidal  Past Medical History:  Diagnosis Date  . Breast cancer ([place])    Left breast  . Depression   . GERD (gastroesophageal reflux disease)   . History of blood transfusion   . History of stomach ulcers   . Hypertension   . Migraines   . Pleurisy   . Urinary incontinence    Current Outpatient Medications on File Prior to Visit  Medication Sig Dispense Refill  . ALPRAZolam (XANAX) 0.5 MG tablet Take 1 tablet (0.5 mg total) by mouth at bedtime as needed for anxiety. 30 tablet 0  . fluticasone (FLONASE) 50 MCG/ACT nasal spray Place 2 sprays into both nostrils daily as needed for allergies or rhinitis. 16 g 5  . lisdexamfetamine (VYVANSE) 60 MG capsule Take 1 capsule (60 mg total) by mouth every morning. 90 capsule 0  . loratadine (CLARITIN) 10 MG tablet Take 1 tablet (10 mg total) by mouth daily. 90 tablet 1  . meloxicam (MOBIC) 15 MG tablet TAKE 1 TABLET BY MOUTH EVERY DAY 90 tablet 1  . methocarbamol (ROBAXIN) 500 MG tablet Take 1 tablet (500 mg total) by mouth 4 (four) times daily. 30 tablet 1   No current facility-administered medications on file prior to visit.     Assessment and Plan: 1. Depression, major, single episode, moderate ([place]) Restart wellbutrin and f/u 1 month  - buPROPion  (WELLBUTRIN XL) 150 MG 24 hr tablet; Take 1 tablet (150 mg total) by mouth daily. X 1 week then increase to 2 in am  Dispense: 60 tablet; Refill: 2  Follow Up Instructions:    I discussed the assessment and treatment plan with the patient. The patient was provided an opportunity to ask questions and all were answered. The patient agreed with the plan and demonstrated an understanding of the instructions.   The patient was advised to call back or seek an in-person evaluation if the symptoms worsen or if the condition fails to improve as anticipated.  I provided 25 minutes of non-face-to-face time during this encounter.   Ann Held, DO

## 2018-06-11 NOTE — Telephone Encounter (Signed)
LVM for pt to schedule Return in about 4 weeks (around 07/09/2018, 4 wk fu appt as a VOV.

## 2018-06-25 MED ORDER — LISDEXAMFETAMINE DIMESYLATE 60 MG PO CAPS: 60.0000 mg | ORAL_CAPSULE | ORAL | 0 refills | Status: DC

## 2018-06-25 NOTE — Telephone Encounter (Signed)
Copied from [place] [id number]. Topic: Quick Communication - Rx Refill/Question >> Jun 25, 2018  8:51 AM Virl Axe D wrote: Medication: lisdexamfetamine (VYVANSE) 60 MG capsule/ Pt stated she is out of medication  Has the patient contacted their pharmacy? No. (Agent: If no, request that the patient contact the pharmacy for the refill.) (Agent: If yes, when and what did the pharmacy advise?)  Preferred Pharmacy (with phone number or street name): [id number] [place], [place] [phone] (Phone) [phone] (Fax)    Agent: Please be advised that RX refills may take up to 3 business days. We ask that you follow-up with your pharmacy.

## 2018-06-25 NOTE — Telephone Encounter (Signed)
Vyvanse refill   Last OV: 06/11/2018 Last Fill: 05/15/2018 #90 and 0RF UDS: 03/08/2018 Low risk

## 2018-07-19 ENCOUNTER — Other Ambulatory Visit: Payer: Self-pay | Admitting: Orthopedic Surgery

## 2018-07-24 ENCOUNTER — Other Ambulatory Visit: Payer: Self-pay | Admitting: Family Medicine

## 2018-07-24 MED ORDER — LISDEXAMFETAMINE DIMESYLATE 60 MG PO CAPS: 60.0000 mg | ORAL_CAPSULE | ORAL | 0 refills | Status: DC

## 2018-07-24 NOTE — Telephone Encounter (Signed)
Vyvanse refill.   Last OV: 06/11/2018 Last Fill: 06/25/2018 #90 and 0RF UDS: 03/08/2018 Low risk

## 2018-07-24 NOTE — Telephone Encounter (Signed)
Copied from [place] [id number] [phone]. Topic: Quick Communication - Rx Refill/Question >> Jul 24, 2018 11:29 AM Yvette Rack wrote: Medication: lisdexamfetamine (VYVANSE) 60 MG capsule  Has the patient contacted their pharmacy? yes   Preferred Pharmacy (with phone number or street name): [id number] - [place], [place] [phone] (Phone)  [phone] (Fax)  Agent: Please be advised that RX refills may take up to 3 business days. We ask that you follow-up with your pharmacy.

## 2018-07-26 NOTE — Pre-Procedure Instructions (Signed)
Theresa [name]  07/26/2018     Your procedure is scheduled on Thursday, July 23.  Report to [facility], Main Entrance or Entrance "A" at 5:30 AM                    Your surgery or procedure is scheduled for 7:30 AM               Call this number if you have problems the morning of surgery: [phone]  This is the number for the Pre- Surgical Desk.                  For any other questions, please call [phone], Monday - Friday 8 AM - 4 PM.   Remember:  Do not eat  after midnight Wednesday, July 22.  You may drink clear liquids until 4:30 AM.  Clear liquids allowed are:  Water, Juice (non-citric and without pulp), Carbonated beverages, Clear Tea, Black Coffee only, Plain Jell-O only, Gatorade and Plain Popsicles only   Drink Pre- Surgery beverage by 4:30 AM, then nothing else to drink.   Take these medicines the morning of surgery with A SIP OF WATER: May use Flonase  1 Week prior to surgery STOP taking Aspirin, Aspirin Products (Goody Powder, Excedrin Migraine), Ibuprofen (Advil), Naproxen (Aleve), Vitamins and Herbal Products (ie Fish Oil).   Special instructions:   [place]- Preparing For Surgery  Before surgery, you can play an important role. Because skin is not sterile, your skin needs to be as free of germs as possible. You can reduce the number of germs on your skin by washing with CHG (chlorahexidine gluconate) Soap before surgery.  CHG is an antiseptic cleaner which kills germs and bonds with the skin to continue killing germs even after washing.    Oral Hygiene is also important to reduce your risk of infection.  Remember - BRUSH YOUR TEETH THE MORNING OF SURGERY WITH YOUR REGULAR TOOTHPASTE  Please do not use if you have an allergy to CHG or antibacterial soaps. If your skin becomes reddened/irritated stop using the CHG.  Do not shave (including legs and underarms) for at least 48 hours prior to first CHG shower. It is OK to shave your face.  Please  follow these instructions carefully.   1. Shower the NIGHT BEFORE SURGERY and the MORNING OF SURGERY with CHG.   2. If you chose to wash your hair, wash your hair first as usual with your normal shampoo.  3. After you shampoo, rinse your hair and body thoroughly to remove the shampoo.  4. Use CHG as you would any other liquid soap. You can apply CHG directly to the skin and wash gently with a scrungie or a clean washcloth.   5. Apply the CHG Soap to your body ONLY FROM THE NECK DOWN.  Do not use on open wounds or open sores. Avoid contact with your eyes, ears, mouth and genitals (private parts). Wash Face and genitals (private parts)  with your normal soap.  6. Wash thoroughly, paying special attention to the area where your surgery will be performed.  7. Thoroughly rinse your body with warm water from the neck down.  8. DO NOT shower/wash with your normal soap after using and rinsing off the CHG Soap.  9. Pat yourself dry with a CLEAN TOWEL.  10. Wear CLEAN PAJAMAS to bed the night before surgery, wear comfortable clothes the morning of surgery  11. Place CLEAN SHEETS  on your bed the night of your first shower and DO NOT SLEEP WITH PETS.    Day of Surgery: Shower as instructed above. Do not wear lotions, powders, or perfumes, or deodorant.  Please wear clean clothes to the hospital/surgery center.   Remember to brush your teeth WITH YOUR REGULAR TOOTHPASTE.  Do not wear jewelry, make-up or nail polish.  Do not shave 48 hours prior to surgery.  Men may shave face and neck.  Do not bring valuables to the hospital.  [facility] is not responsible for any belongings or valuables.  Contacts, dentures or bridgework may not be worn into surgery.  Leave your suitcase in the car.  After surgery it may be brought to your room.  For patients admitted to the hospital, discharge time will be determined by your treatment team.  Patients discharged the day of surgery will not be allowed to  drive home.   Please read over the following fact sheets that you were given :  Coughing and Deep Breathing and Pain Booklet, Surgical Site Infection.

## 2018-07-29 ENCOUNTER — Other Ambulatory Visit: Payer: Self-pay

## 2018-07-29 ENCOUNTER — Encounter (HOSPITAL_COMMUNITY): Payer: Self-pay

## 2018-07-29 ENCOUNTER — Other Ambulatory Visit (HOSPITAL_COMMUNITY)
Admission: RE | Admit: 2018-07-29 | Discharge: 2018-07-29 | Disposition: A | Discharge status: Home / Self Care | Payer: BC Managed Care – PPO | Source: Ambulatory Visit | Attending: Orthopedic Surgery | Admitting: Orthopedic Surgery

## 2018-07-29 ENCOUNTER — Encounter (HOSPITAL_COMMUNITY)
Admission: RE | Admit: 2018-07-29 | Discharge: 2018-07-29 | Disposition: A | Discharge status: Home / Self Care | Payer: BC Managed Care – PPO | Source: Ambulatory Visit | Attending: Orthopedic Surgery | Admitting: Orthopedic Surgery

## 2018-07-29 DIAGNOSIS — Z01812 Encounter for preprocedural laboratory examination: Secondary | ICD-10-CM | POA: Diagnosis present

## 2018-07-29 DIAGNOSIS — Z20828 Contact with and (suspected) exposure to other viral communicable diseases: Secondary | ICD-10-CM | POA: Diagnosis not present

## 2018-07-29 HISTORY — DX: Fibromyalgia: M79.7

## 2018-07-29 HISTORY — DX: Other specified postprocedural states: R11.2

## 2018-07-29 HISTORY — DX: Diverticulitis of intestine, part unspecified, without perforation or abscess without bleeding: K57.92

## 2018-07-29 HISTORY — DX: Constipation, unspecified: K59.00

## 2018-07-29 HISTORY — DX: Unspecified osteoarthritis, unspecified site: M19.90

## 2018-07-29 HISTORY — DX: Anemia, unspecified: D64.9

## 2018-07-29 HISTORY — DX: Pneumonia, unspecified organism: J18.9

## 2018-07-29 HISTORY — DX: Family history of other specified conditions: Z84.89

## 2018-07-29 HISTORY — DX: Other specified behavioral and emotional disorders with onset usually occurring in childhood and adolescence: F98.8

## 2018-07-29 HISTORY — DX: Other complications of anesthesia, initial encounter: T88.59XA

## 2018-07-29 HISTORY — DX: Nausea with vomiting, unspecified: R11.2

## 2018-07-29 HISTORY — DX: Other specified postprocedural states: Z98.890

## 2018-07-29 LAB — URINALYSIS, ROUTINE W REFLEX MICROSCOPIC
Bilirubin Urine: NEGATIVE
Glucose, UA: NEGATIVE mg/dL
Hgb urine dipstick: NEGATIVE
Ketones, ur: NEGATIVE mg/dL
Nitrite: NEGATIVE
Protein, ur: NEGATIVE mg/dL
Specific Gravity, Urine: 1.011 (ref 1.005–1.030)
pH: 5 (ref 5.0–8.0)

## 2018-07-29 LAB — CBC WITH DIFFERENTIAL/PLATELET
Abs Immature Granulocytes: 0.01 10*3/uL (ref 0.00–0.07)
Basophils Absolute: 0 10*3/uL (ref 0.0–0.1)
Basophils Relative: 1 %
Eosinophils Absolute: 0.2 10*3/uL (ref 0.0–0.5)
Eosinophils Relative: 3 %
HCT: 42.3 % (ref 36.0–46.0)
Hemoglobin: 13.5 g/dL (ref 12.0–15.0)
Immature Granulocytes: 0 %
Lymphocytes Relative: 32 %
Lymphs Abs: 1.7 10*3/uL (ref 0.7–4.0)
MCH: 27.5 pg (ref 26.0–34.0)
MCHC: 31.9 g/dL (ref 30.0–36.0)
MCV: 86.2 fL (ref 80.0–100.0)
Monocytes Absolute: 0.6 10*3/uL (ref 0.1–1.0)
Monocytes Relative: 11 %
Neutro Abs: 2.9 10*3/uL (ref 1.7–7.7)
Neutrophils Relative %: 53 %
Platelets: 275 10*3/uL (ref 150–400)
RBC: 4.91 MIL/uL (ref 3.87–5.11)
RDW: 14.4 % (ref 11.5–15.5)
WBC: 5.4 10*3/uL (ref 4.0–10.5)
nRBC: 0 % (ref 0.0–0.2)

## 2018-07-29 LAB — ABO/RH: ABO/RH(D): O POS

## 2018-07-29 LAB — COMPREHENSIVE METABOLIC PANEL
ALT: 18 U/L (ref 0–44)
AST: 18 U/L (ref 15–41)
Albumin: 3.9 g/dL (ref 3.5–5.0)
Alkaline Phosphatase: 57 U/L (ref 38–126)
Anion gap: 9 (ref 5–15)
BUN: 14 mg/dL (ref 8–23)
CO2: 24 mmol/L (ref 22–32)
Calcium: 9.3 mg/dL (ref 8.9–10.3)
Chloride: 105 mmol/L (ref 98–111)
Creatinine, Ser: 0.92 mg/dL (ref 0.44–1.00)
GFR calc Af Amer: >60 mL/min (ref >60)
GFR calc non Af Amer: >60 mL/min (ref >60)
Glucose, Bld: 105 mg/dL — ABNORMAL HIGH (ref 70–99)
Potassium: 4 mmol/L (ref 3.5–5.1)
Sodium: 138 mmol/L (ref 135–145)
Total Bilirubin: 0.5 mg/dL (ref 0.3–1.2)
Total Protein: 6.9 g/dL (ref 6.5–8.1)

## 2018-07-29 LAB — TYPE AND SCREEN
ABO/RH(D): O POS
Antibody Screen: NEGATIVE

## 2018-07-29 LAB — APTT: aPTT: 27 seconds (ref 24–36)

## 2018-07-29 LAB — SURGICAL PCR SCREEN
MRSA, PCR: NEGATIVE
Staphylococcus aureus: POSITIVE — AB

## 2018-07-29 LAB — PROTIME-INR
INR: 0.9 (ref 0.8–1.2)
Prothrombin Time: 12.4 seconds (ref 11.4–15.2)

## 2018-07-29 NOTE — Progress Notes (Addendum)
PCP - Dr. Carollee Herter  Cardiologist - no  Chest x-ray - na  EKG - na  Stress Test -   ECHO - no  Cardiac Cath - no  Sleep Study - no CPAP - no  LABS-CBC, CMP, PT, PTT, UA, PCR,   ASA-no Patient holding NSAID's  ERAS- Yes, clear until 4:30 MA- Pre- surgery drink.  Patient voiced understanding  HA1C-na Fasting Blood Sugar - na Checks Blood Sugar ___na__ times a day  Anesthesia- na  Pt denies having chest pain, sob, or fever at this time. All instructions explained to the pt, with a verbal understanding of the material. Pt agrees to go over the instructions while at home for a better understanding. Pt also instructed to self quarantine after being tested for COVID-19. The opportunity to ask questions was provided.  Mrs. Jacot reports that she had severe N/V after a surgery possibly in 2005, she would like for [place] to find out what mediation she received.  I sent a request to [facility].  Unable to find any records from 2005 surgeries, I called patient , she said it might have been 1986 or 2010- but it was at [facility].

## 2018-07-30 ENCOUNTER — Encounter (HOSPITAL_COMMUNITY): Payer: Self-pay

## 2018-07-30 LAB — SARS CORONAVIRUS 2 (TAT 6-24 HRS): SARS Coronavirus 2: NEGATIVE

## 2018-07-30 NOTE — Anesthesia Preprocedure Evaluation (Addendum)
Anesthesia Evaluation  Patient identified by MRN, date of birth, ID band Patient awake    Reviewed: Allergy & Precautions, NPO status , Patient's Chart, lab work & pertinent test results  History of Anesthesia Complications (+) PONV  Airway Mallampati: III  TM Distance: >3 FB Neck ROM: Limited  Mouth opening: Limited Mouth Opening  Dental no notable dental hx. (+) Teeth Intact, Dental Advisory Given   Pulmonary sleep apnea ,    Pulmonary exam normal breath sounds clear to auscultation       Cardiovascular hypertension, negative cardio ROS Normal cardiovascular exam Rhythm:Regular Rate:Normal     Neuro/Psych  Headaches, PSYCHIATRIC DISORDERS Anxiety Depression    GI/Hepatic Neg liver ROS, GERD  Medicated,  Endo/Other  negative endocrine ROS  Renal/GU negative Renal ROS  negative genitourinary   Musculoskeletal  (+) Arthritis , Fibromyalgia -  Abdominal   Peds  (+) ATTENTION DEFICIT DISORDER WITHOUT HYPERACTIVITY Hematology negative hematology ROS (+)   Anesthesia Other Findings   Reproductive/Obstetrics                           Anesthesia Physical Anesthesia Plan  ASA: III  Anesthesia Plan: General   Post-op Pain Management:    Induction: Intravenous  PONV Risk Score and Plan: 4 or greater and Scopolamine patch - Pre-op, Midazolam, Dexamethasone and Ondansetron  Airway Management Planned: Oral ETT and Video Laryngoscope Planned  Additional Equipment:   Intra-op Plan:   Post-operative Plan: Extubation in OR  Informed Consent: I have reviewed the patients History and Physical, chart, labs and discussed the procedure including the risks, benefits and alternatives for the proposed anesthesia with the patient or authorized representative who has indicated his/her understanding and acceptance.     Dental advisory given  Plan Discussed with: CRNA  Anesthesia Plan Comments:       Anesthesia Quick Evaluation

## 2018-08-01 ENCOUNTER — Ambulatory Visit (HOSPITAL_COMMUNITY)
Admission: RE | Disposition: A | Discharge status: Home / Self Care | Payer: Self-pay | Source: Home / Self Care | Attending: Orthopedic Surgery

## 2018-08-01 ENCOUNTER — Encounter (HOSPITAL_COMMUNITY): Payer: Self-pay | Admitting: Certified Registered"

## 2018-08-01 ENCOUNTER — Ambulatory Visit (HOSPITAL_COMMUNITY): Payer: BC Managed Care – PPO

## 2018-08-01 ENCOUNTER — Other Ambulatory Visit: Payer: Self-pay

## 2018-08-01 ENCOUNTER — Ambulatory Visit (HOSPITAL_COMMUNITY): Payer: BC Managed Care – PPO | Admitting: Anesthesiology

## 2018-08-01 ENCOUNTER — Observation Stay (HOSPITAL_COMMUNITY)
Admission: RE | Admit: 2018-08-01 | Discharge: 2018-08-02 | Disposition: A | Discharge status: Home / Self Care | Payer: BC Managed Care – PPO | Attending: Orthopedic Surgery | Admitting: Orthopedic Surgery

## 2018-08-01 ENCOUNTER — Ambulatory Visit (HOSPITAL_COMMUNITY): Payer: BC Managed Care – PPO | Admitting: Physician Assistant

## 2018-08-01 DIAGNOSIS — F329 Major depressive disorder, single episode, unspecified: Secondary | ICD-10-CM | POA: Insufficient documentation

## 2018-08-01 DIAGNOSIS — K219 Gastro-esophageal reflux disease without esophagitis: Secondary | ICD-10-CM | POA: Insufficient documentation

## 2018-08-01 DIAGNOSIS — F988 Other specified behavioral and emotional disorders with onset usually occurring in childhood and adolescence: Secondary | ICD-10-CM | POA: Insufficient documentation

## 2018-08-01 DIAGNOSIS — F419 Anxiety disorder, unspecified: Secondary | ICD-10-CM | POA: Insufficient documentation

## 2018-08-01 DIAGNOSIS — G4730 Sleep apnea, unspecified: Secondary | ICD-10-CM | POA: Diagnosis not present

## 2018-08-01 DIAGNOSIS — M5412 Radiculopathy, cervical region: Secondary | ICD-10-CM | POA: Insufficient documentation

## 2018-08-01 DIAGNOSIS — Z853 Personal history of malignant neoplasm of breast: Secondary | ICD-10-CM | POA: Insufficient documentation

## 2018-08-01 DIAGNOSIS — I10 Essential (primary) hypertension: Secondary | ICD-10-CM | POA: Diagnosis not present

## 2018-08-01 DIAGNOSIS — M4802 Spinal stenosis, cervical region: Principal | ICD-10-CM | POA: Insufficient documentation

## 2018-08-01 DIAGNOSIS — Z79899 Other long term (current) drug therapy: Secondary | ICD-10-CM | POA: Diagnosis not present

## 2018-08-01 DIAGNOSIS — M5410 Radiculopathy, site unspecified: Secondary | ICD-10-CM | POA: Diagnosis present

## 2018-08-01 DIAGNOSIS — Z419 Encounter for procedure for purposes other than remedying health state, unspecified: Secondary | ICD-10-CM

## 2018-08-01 DIAGNOSIS — K5900 Constipation, unspecified: Secondary | ICD-10-CM | POA: Insufficient documentation

## 2018-08-01 HISTORY — PX: ANTERIOR CERVICAL DECOMPRESSION/DISCECTOMY FUSION 4 LEVELS: SHX5556

## 2018-08-01 SURGERY — ANTERIOR CERVICAL DECOMPRESSION/DISCECTOMY FUSION 4 LEVELS
Anesthesia: General | Site: Spine Cervical

## 2018-08-01 MED ORDER — LIDOCAINE 2% (20 MG/ML) 5 ML SYRINGE
INTRAMUSCULAR | Status: AC
  Filled 2018-08-01: qty 5

## 2018-08-01 MED ORDER — ZOLPIDEM TARTRATE 5 MG PO TABS: 5.0000 mg | ORAL_TABLET | Freq: Every evening | ORAL | Status: DC | PRN

## 2018-08-01 MED ORDER — ONDANSETRON HCL 4 MG/2ML IJ SOLN
INTRAMUSCULAR | Status: DC | PRN
  Administered 2018-08-01: 4 mg via INTRAVENOUS

## 2018-08-01 MED ORDER — FENTANYL CITRATE (PF) 100 MCG/2ML IJ SOLN
25.0000 ug | INTRAMUSCULAR | Status: DC | PRN
  Administered 2018-08-01: 11:00:00 25 ug via INTRAVENOUS
  Administered 2018-08-01: 12:00:00 50 ug via INTRAVENOUS
  Administered 2018-08-01: 11:00:00 25 ug via INTRAVENOUS

## 2018-08-01 MED ORDER — DOCUSATE SODIUM 100 MG PO CAPS
100.0000 mg | ORAL_CAPSULE | Freq: Two times a day (BID) | ORAL | Status: DC
  Administered 2018-08-01 – 2018-08-02 (×2): 100 mg via ORAL
  Filled 2018-08-01 (×2): qty 1

## 2018-08-01 MED ORDER — DIPHENHYDRAMINE HCL 50 MG/ML IJ SOLN
INTRAMUSCULAR | Status: DC | PRN
  Administered 2018-08-01: 10 mg via INTRAVENOUS

## 2018-08-01 MED ORDER — OXYCODONE-ACETAMINOPHEN 5-325 MG PO TABS
1.0000 | ORAL_TABLET | ORAL | Status: DC | PRN
  Administered 2018-08-01 – 2018-08-02 (×5): 2 via ORAL
  Filled 2018-08-01 (×5): qty 2

## 2018-08-01 MED ORDER — LIDOCAINE 2% (20 MG/ML) 5 ML SYRINGE
INTRAMUSCULAR | Status: DC | PRN
  Administered 2018-08-01: 80 mg via INTRAVENOUS

## 2018-08-01 MED ORDER — DIPHENHYDRAMINE HCL 25 MG PO CAPS
25.0000 mg | ORAL_CAPSULE | ORAL | Status: DC | PRN
  Administered 2018-08-01 – 2018-08-02 (×4): 25 mg via ORAL
  Filled 2018-08-01 (×4): qty 1

## 2018-08-01 MED ORDER — THROMBIN 20000 UNITS EX KIT
PACK | CUTANEOUS | Status: AC
  Filled 2018-08-01: qty 1

## 2018-08-01 MED ORDER — ONDANSETRON HCL 4 MG PO TABS: 4.0000 mg | ORAL_TABLET | Freq: Four times a day (QID) | ORAL | Status: DC | PRN

## 2018-08-01 MED ORDER — ALPRAZOLAM 0.5 MG PO TABS: 0.5000 mg | ORAL_TABLET | Freq: Every evening | ORAL | Status: DC | PRN

## 2018-08-01 MED ORDER — FLEET ENEMA 7-19 GM/118ML RE ENEM: 1.0000 | ENEMA | Freq: Once | RECTAL | Status: DC | PRN

## 2018-08-01 MED ORDER — POVIDONE-IODINE 7.5 % EX SOLN
Freq: Once | CUTANEOUS | Status: DC
  Filled 2018-08-01: qty 118

## 2018-08-01 MED ORDER — SUGAMMADEX SODIUM 200 MG/2ML IV SOLN
INTRAVENOUS | Status: DC | PRN
  Administered 2018-08-01: 200 mg via INTRAVENOUS

## 2018-08-01 MED ORDER — CEFAZOLIN SODIUM-DEXTROSE 2-4 GM/100ML-% IV SOLN
2.0000 g | Freq: Three times a day (TID) | INTRAVENOUS | Status: AC
  Administered 2018-08-01 (×2): 2 g via INTRAVENOUS
  Filled 2018-08-01 (×2): qty 100

## 2018-08-01 MED ORDER — LACTATED RINGERS IV SOLN
INTRAVENOUS | Status: DC | PRN
  Administered 2018-08-01 (×2): via INTRAVENOUS

## 2018-08-01 MED ORDER — DEXAMETHASONE SODIUM PHOSPHATE 10 MG/ML IJ SOLN
INTRAMUSCULAR | Status: AC
  Filled 2018-08-01: qty 1

## 2018-08-01 MED ORDER — PHENOL 1.4 % MT LIQD: 1.0000 | OROMUCOSAL | Status: DC | PRN

## 2018-08-01 MED ORDER — BISACODYL 5 MG PO TBEC: 5.0000 mg | DELAYED_RELEASE_TABLET | Freq: Every day | ORAL | Status: DC | PRN

## 2018-08-01 MED ORDER — 0.9 % SODIUM CHLORIDE (POUR BTL) OPTIME
TOPICAL | Status: DC | PRN
  Administered 2018-08-01 (×3): 1000 mL

## 2018-08-01 MED ORDER — DIAZEPAM 5 MG PO TABS
5.0000 mg | ORAL_TABLET | Freq: Four times a day (QID) | ORAL | Status: DC | PRN
  Administered 2018-08-01: 5 mg via ORAL

## 2018-08-01 MED ORDER — CEFAZOLIN SODIUM-DEXTROSE 2-4 GM/100ML-% IV SOLN
2.0000 g | INTRAVENOUS | Status: AC
  Administered 2018-08-01: 07:00:00 2 g via INTRAVENOUS

## 2018-08-01 MED ORDER — STERILE WATER FOR IRRIGATION IR SOLN
Status: DC | PRN
  Administered 2018-08-01: 1000 mL

## 2018-08-01 MED ORDER — FENTANYL CITRATE (PF) 100 MCG/2ML IJ SOLN
INTRAMUSCULAR | Status: AC
  Filled 2018-08-01: qty 2

## 2018-08-01 MED ORDER — ROCURONIUM BROMIDE 10 MG/ML (PF) SYRINGE
PREFILLED_SYRINGE | INTRAVENOUS | Status: AC
  Filled 2018-08-01: qty 10

## 2018-08-01 MED ORDER — BUPIVACAINE-EPINEPHRINE 0.25% -1:200000 IJ SOLN
INTRAMUSCULAR | Status: DC | PRN
  Administered 2018-08-01: 30 mL

## 2018-08-01 MED ORDER — ACETAMINOPHEN 650 MG RE SUPP: 650.0000 mg | RECTAL | Status: DC | PRN

## 2018-08-01 MED ORDER — CEFAZOLIN SODIUM-DEXTROSE 2-4 GM/100ML-% IV SOLN
INTRAVENOUS | Status: AC
  Filled 2018-08-01: qty 100

## 2018-08-01 MED ORDER — PHENYLEPHRINE HCL (PRESSORS) 10 MG/ML IV SOLN
INTRAVENOUS | Status: AC
  Filled 2018-08-01: qty 1

## 2018-08-01 MED ORDER — PROPOFOL 10 MG/ML IV BOLUS
INTRAVENOUS | Status: DC | PRN
  Administered 2018-08-01: 20 mg via INTRAVENOUS
  Administered 2018-08-01: 150 mg via INTRAVENOUS

## 2018-08-01 MED ORDER — SUFENTANIL CITRATE 50 MCG/ML IV SOLN
INTRAVENOUS | Status: DC | PRN
  Administered 2018-08-01: 15 ug via INTRAVENOUS
  Administered 2018-08-01 (×2): 5 ug via INTRAVENOUS
  Administered 2018-08-01: 10 ug via INTRAVENOUS

## 2018-08-01 MED ORDER — DIAZEPAM 5 MG PO TABS
5.0000 mg | ORAL_TABLET | Freq: Four times a day (QID) | ORAL | Status: DC | PRN
  Administered 2018-08-01: 5 mg via ORAL
  Filled 2018-08-01: qty 1

## 2018-08-01 MED ORDER — FLUTICASONE PROPIONATE 50 MCG/ACT NA SUSP
2.0000 | Freq: Every day | NASAL | Status: DC | PRN
  Filled 2018-08-01: qty 16

## 2018-08-01 MED ORDER — SODIUM CHLORIDE 0.9 % IV SOLN
250.0000 mL | INTRAVENOUS | Status: DC
  Administered 2018-08-01: 14:00:00 250 mL via INTRAVENOUS

## 2018-08-01 MED ORDER — DIAZEPAM 5 MG PO TABS
ORAL_TABLET | ORAL | Status: AC
  Filled 2018-08-01: qty 1

## 2018-08-01 MED ORDER — BUPIVACAINE HCL (PF) 0.25 % IJ SOLN
INTRAMUSCULAR | Status: AC
  Filled 2018-08-01: qty 30

## 2018-08-01 MED ORDER — SODIUM CHLORIDE 0.9% FLUSH
3.0000 mL | Freq: Two times a day (BID) | INTRAVENOUS | Status: DC
  Administered 2018-08-01: 13:00:00 3 mL via INTRAVENOUS

## 2018-08-01 MED ORDER — THROMBIN 20000 UNITS EX SOLR
CUTANEOUS | Status: DC | PRN
  Administered 2018-08-01: 08:00:00 20000 [IU] via TOPICAL

## 2018-08-01 MED ORDER — ONDANSETRON HCL 4 MG/2ML IJ SOLN
4.0000 mg | Freq: Four times a day (QID) | INTRAMUSCULAR | Status: DC | PRN
  Administered 2018-08-01: 20:00:00 4 mg via INTRAVENOUS
  Filled 2018-08-01: qty 2

## 2018-08-01 MED ORDER — DEXAMETHASONE SODIUM PHOSPHATE 10 MG/ML IJ SOLN
INTRAMUSCULAR | Status: DC | PRN
  Administered 2018-08-01: 4 mg via INTRAVENOUS

## 2018-08-01 MED ORDER — HEMOSTATIC AGENTS (NO CHARGE) OPTIME
TOPICAL | Status: DC | PRN
  Administered 2018-08-01: 1 via TOPICAL

## 2018-08-01 MED ORDER — SODIUM CHLORIDE (PF) 0.9 % IJ SOLN
INTRAMUSCULAR | Status: AC
  Filled 2018-08-01: qty 10

## 2018-08-01 MED ORDER — LISDEXAMFETAMINE DIMESYLATE 30 MG PO CAPS
60.0000 mg | ORAL_CAPSULE | ORAL | Status: DC
  Administered 2018-08-02: 06:00:00 60 mg via ORAL
  Filled 2018-08-01: qty 2

## 2018-08-01 MED ORDER — SCOPOLAMINE 1 MG/3DAYS TD PT72
1.0000 | MEDICATED_PATCH | TRANSDERMAL | Status: DC
  Administered 2018-08-01: 07:00:00 1 via TRANSDERMAL

## 2018-08-01 MED ORDER — DIPHENHYDRAMINE HCL 50 MG/ML IJ SOLN
INTRAMUSCULAR | Status: AC
  Filled 2018-08-01: qty 1

## 2018-08-01 MED ORDER — PANTOPRAZOLE SODIUM 40 MG IV SOLR
40.0000 mg | Freq: Every day | INTRAVENOUS | Status: DC
  Administered 2018-08-01: 22:00:00 40 mg via INTRAVENOUS
  Filled 2018-08-01: qty 40

## 2018-08-01 MED ORDER — ONDANSETRON HCL 4 MG/2ML IJ SOLN
INTRAMUSCULAR | Status: AC
  Filled 2018-08-01: qty 2

## 2018-08-01 MED ORDER — MIDAZOLAM HCL 2 MG/2ML IJ SOLN
INTRAMUSCULAR | Status: AC
  Filled 2018-08-01: qty 2

## 2018-08-01 MED ORDER — ACETAMINOPHEN 500 MG PO TABS
1000.0000 mg | ORAL_TABLET | Freq: Once | ORAL | Status: AC
  Administered 2018-08-01: 07:00:00 1000 mg via ORAL
  Filled 2018-08-01: qty 2

## 2018-08-01 MED ORDER — MIDAZOLAM HCL 5 MG/5ML IJ SOLN
INTRAMUSCULAR | Status: DC | PRN
  Administered 2018-08-01: 2 mg via INTRAVENOUS

## 2018-08-01 MED ORDER — ACETAMINOPHEN 325 MG PO TABS: 650.0000 mg | ORAL_TABLET | ORAL | Status: DC | PRN

## 2018-08-01 MED ORDER — MENTHOL 3 MG MT LOZG: 1.0000 | LOZENGE | OROMUCOSAL | Status: DC | PRN

## 2018-08-01 MED ORDER — SODIUM CHLORIDE 0.9 % IV SOLN
INTRAVENOUS | Status: DC | PRN
  Administered 2018-08-01: 09:00:00 25 ug/min via INTRAVENOUS

## 2018-08-01 MED ORDER — SUFENTANIL CITRATE 50 MCG/ML IV SOLN
INTRAVENOUS | Status: AC
  Filled 2018-08-01: qty 1

## 2018-08-01 MED ORDER — ROCURONIUM BROMIDE 10 MG/ML (PF) SYRINGE
PREFILLED_SYRINGE | INTRAVENOUS | Status: DC | PRN
  Administered 2018-08-01 (×2): 10 mg via INTRAVENOUS
  Administered 2018-08-01: 70 mg via INTRAVENOUS
  Administered 2018-08-01: 10 mg via INTRAVENOUS

## 2018-08-01 MED ORDER — SODIUM CHLORIDE 0.9% FLUSH: 3.0000 mL | INTRAVENOUS | Status: DC | PRN

## 2018-08-01 MED ORDER — PROPOFOL 10 MG/ML IV BOLUS
INTRAVENOUS | Status: AC
  Filled 2018-08-01: qty 20

## 2018-08-01 MED ORDER — BUPROPION HCL ER (XL) 300 MG PO TB24
300.0000 mg | ORAL_TABLET | Freq: Every day | ORAL | Status: DC
  Administered 2018-08-01: 22:00:00 300 mg via ORAL
  Filled 2018-08-01: qty 1

## 2018-08-01 MED ORDER — BUPIVACAINE-EPINEPHRINE (PF) 0.25% -1:200000 IJ SOLN
INTRAMUSCULAR | Status: AC
  Filled 2018-08-01: qty 30

## 2018-08-01 MED ORDER — HYDROXYZINE HCL 50 MG/ML IM SOLN
50.0000 mg | INTRAMUSCULAR | Status: DC | PRN
  Administered 2018-08-01 (×2): 50 mg via INTRAMUSCULAR
  Filled 2018-08-01 (×2): qty 1

## 2018-08-01 MED ORDER — SCOPOLAMINE 1 MG/3DAYS TD PT72
MEDICATED_PATCH | TRANSDERMAL | Status: AC
  Filled 2018-08-01: qty 1

## 2018-08-01 MED ORDER — SENNOSIDES-DOCUSATE SODIUM 8.6-50 MG PO TABS: 1.0000 | ORAL_TABLET | Freq: Every evening | ORAL | Status: DC | PRN

## 2018-08-01 MED ORDER — ALUM & MAG HYDROXIDE-SIMETH 200-200-20 MG/5ML PO SUSP: 30.0000 mL | Freq: Four times a day (QID) | ORAL | Status: DC | PRN

## 2018-08-01 SURGICAL SUPPLY — 83 items
AGENT HMST KT MTR STRL THRMB (HEMOSTASIS)
APL SKNCLS STERI-STRIP NONHPOA (GAUZE/BANDAGES/DRESSINGS) ×1
BENZOIN TINCTURE PRP APPL 2/3 (GAUZE/BANDAGES/DRESSINGS) ×3 IMPLANT
BIT DRILL NEURO 2X3.1 SFT TUCH (MISCELLANEOUS) ×1 IMPLANT
BIT DRILL SRG 14X2.2XFLT CHK (BIT) IMPLANT
BIT DRL SRG 14X2.2XFLT CHK (BIT) ×1
BLADE CLIPPER SURG (BLADE) ×3 IMPLANT
BLADE SURG 15 STRL LF DISP TIS (BLADE) ×1 IMPLANT
BLADE SURG 15 STRL SS (BLADE) ×3
BONE VIVIGEN FORMABLE 1.3CC (Bone Implant) ×6 IMPLANT
BUR MATCHSTICK NEURO 3.0 LAGG (BURR) IMPLANT
CARTRIDGE OIL MAESTRO DRILL (MISCELLANEOUS) ×1 IMPLANT
CLOSURE WOUND 1/2 X4 (GAUZE/BANDAGES/DRESSINGS) ×1
COVER SURGICAL LIGHT HANDLE (MISCELLANEOUS) ×3 IMPLANT
COVER WAND RF STERILE (DRAPES) ×3 IMPLANT
DECANTER SPIKE VIAL GLASS SM (MISCELLANEOUS) ×1 IMPLANT
DIFFUSER DRILL AIR PNEUMATIC (MISCELLANEOUS) ×3 IMPLANT
DRAIN JACKSON RD 7FR 3/32 (WOUND CARE) IMPLANT
DRAPE C-ARM 42X72 X-RAY (DRAPES) ×3 IMPLANT
DRAPE POUCH INSTRU U-SHP 10X18 (DRAPES) ×3 IMPLANT
DRAPE SURG 17X23 STRL (DRAPES) ×9 IMPLANT
DRILL BIT SKYLINE 14MM (BIT) ×3
DRILL NEURO 2X3.1 SOFT TOUCH (MISCELLANEOUS) ×3
DURAPREP 26ML APPLICATOR (WOUND CARE) ×3 IMPLANT
ELECT COATED BLADE 2.86 ST (ELECTRODE) ×3 IMPLANT
ELECT REM PT RETURN 9FT ADLT (ELECTROSURGICAL) ×3
ELECTRODE REM PT RTRN 9FT ADLT (ELECTROSURGICAL) ×1 IMPLANT
EVACUATOR SILICONE 100CC (DRAIN) IMPLANT
GAUZE 4X4 16PLY RFD (DISPOSABLE) ×3 IMPLANT
GAUZE SPONGE 4X4 12PLY STRL (GAUZE/BANDAGES/DRESSINGS) ×3 IMPLANT
GAUZE SPONGE 4X4 12PLY STRL LF (GAUZE/BANDAGES/DRESSINGS) ×2 IMPLANT
GLOVE BIO SURGEON STRL SZ7 (GLOVE) ×5 IMPLANT
GLOVE BIO SURGEON STRL SZ7.5 (GLOVE) ×2 IMPLANT
GLOVE BIO SURGEON STRL SZ8 (GLOVE) ×3 IMPLANT
GLOVE BIOGEL PI IND STRL 7.0 (GLOVE) ×2 IMPLANT
GLOVE BIOGEL PI IND STRL 8 (GLOVE) ×1 IMPLANT
GLOVE BIOGEL PI INDICATOR 7.0 (GLOVE) ×4
GLOVE BIOGEL PI INDICATOR 8 (GLOVE) ×2
GLOVE INDICATOR 7.0 STRL GRN (GLOVE) ×4 IMPLANT
GOWN STRL REUS W/ TWL LRG LVL3 (GOWN DISPOSABLE) ×1 IMPLANT
GOWN STRL REUS W/ TWL XL LVL3 (GOWN DISPOSABLE) ×1 IMPLANT
GOWN STRL REUS W/TWL LRG LVL3 (GOWN DISPOSABLE) ×12
GOWN STRL REUS W/TWL XL LVL3 (GOWN DISPOSABLE) ×3
GRAFT BNE MATRIX VG FRMBL SM 1 (Bone Implant) IMPLANT
INTERLOCK LRDTC CRVCL VBR 6MM (Bone Implant) IMPLANT
INTERLOCK LRDTC CRVCL VBR SM (Bone Implant) IMPLANT
IV CATH 14GX2 1/4 (CATHETERS) ×3 IMPLANT
KIT BASIN OR (CUSTOM PROCEDURE TRAY) ×3 IMPLANT
KIT TURNOVER KIT B (KITS) ×3 IMPLANT
LORDOTIC CERVICAL VBR 6MM SM (Bone Implant) ×3 IMPLANT
LORDOTIC CERVICAL VBR SM 5MM (Bone Implant) ×6 IMPLANT
MANIFOLD NEPTUNE II (INSTRUMENTS) ×3 IMPLANT
NDL PRECISIONGLIDE 27X1.5 (NEEDLE) ×1 IMPLANT
NDL SPNL 20GX3.5 QUINCKE YW (NEEDLE) ×1 IMPLANT
NEEDLE PRECISIONGLIDE 27X1.5 (NEEDLE) ×3 IMPLANT
NEEDLE SPNL 20GX3.5 QUINCKE YW (NEEDLE) ×3 IMPLANT
NS IRRIG 1000ML POUR BTL (IV SOLUTION) ×3 IMPLANT
OIL CARTRIDGE MAESTRO DRILL (MISCELLANEOUS) ×3
PACK ORTHO CERVICAL (CUSTOM PROCEDURE TRAY) ×3 IMPLANT
PAD ARMBOARD 7.5X6 YLW CONV (MISCELLANEOUS) ×6 IMPLANT
PATTIES SURGICAL .5 X.5 (GAUZE/BANDAGES/DRESSINGS) IMPLANT
PATTIES SURGICAL .5 X1 (DISPOSABLE) IMPLANT
PIN DISTRACTION 14 (PIN) ×4 IMPLANT
PLATE SKYLINE 3LVL 45MM CERV (Plate) ×2 IMPLANT
POSITIONER HEAD DONUT 9IN (MISCELLANEOUS) ×3 IMPLANT
SCREW SKYLINE VAR OS 14MM (Screw) ×12 IMPLANT
SPONGE INTESTINAL PEANUT (DISPOSABLE) ×6 IMPLANT
SPONGE SURGIFOAM ABS GEL 100 (HEMOSTASIS) ×3 IMPLANT
STRIP CLOSURE SKIN 1/2X4 (GAUZE/BANDAGES/DRESSINGS) ×2 IMPLANT
SURGIFLO W/THROMBIN 8M KIT (HEMOSTASIS) IMPLANT
SUT BONE WAX W31G (SUTURE) ×2 IMPLANT
SUT MNCRL AB 4-0 PS2 18 (SUTURE) ×3 IMPLANT
SUT VIC AB 2-0 CT2 18 VCP726D (SUTURE) ×3 IMPLANT
SYR BULB IRRIGATION 50ML (SYRINGE) ×3 IMPLANT
SYR CONTROL 10ML LL (SYRINGE) ×6 IMPLANT
TAPE CLOTH 4X10 WHT NS (GAUZE/BANDAGES/DRESSINGS) ×3 IMPLANT
TAPE CLOTH SURG 4X10 WHT LF (GAUZE/BANDAGES/DRESSINGS) ×2 IMPLANT
TAPE UMBILICAL COTTON 1/8X30 (MISCELLANEOUS) ×3 IMPLANT
TOWEL GREEN STERILE (TOWEL DISPOSABLE) ×3 IMPLANT
TOWEL GREEN STERILE FF (TOWEL DISPOSABLE) ×3 IMPLANT
TRAY FOLEY MTR SLVR 16FR STAT (SET/KITS/TRAYS/PACK) ×1 IMPLANT
WATER STERILE IRR 1000ML POUR (IV SOLUTION) ×3 IMPLANT
YANKAUER SUCT BULB TIP NO VENT (SUCTIONS) ×3 IMPLANT

## 2018-08-01 NOTE — Op Note (Signed)
PATIENT NAME: ELLARAE NEVITT   MEDICAL RECORD NO.:   [id number]    PHYSICIAN:  Phylliss Bob, MD      DATE OF BIRTH: July 08, 1955  ASSISTANT: Pricilla Holm, PA-C   DATE OF PROCEDURE: 08/01/2018                               OPERATIVE REPORT     PREOPERATIVE DIAGNOSES: 1. Right-sided cervical radiculopathy. 2. Spinal stenosis spanning C4-C7.   POSTOPERATIVE DIAGNOSES: 1. Right-sided cervical radiculopathy. 2. Spinal stenosis spanning C4-C7.   PROCEDURE: 1. Anterior cervical decompression and fusion C4/5, C5/6, C6/7. 2. Placement of anterior instrumentation, C4-C7. 3. Insertion of interbody device x 3 (Titan intervertebral spacers). 4. Intraoperative use of fluoroscopy. 5. Use of morselized allograft - ViviGen.   SURGEON:  Phylliss Bob, MD   ASSISTANT:  Pricilla Holm, PA-C.   ANESTHESIA:  General endotracheal anesthesia.   COMPLICATIONS:  None.   DISPOSITION:  Stable.   ESTIMATED BLOOD LOSS:  Minimal.   INDICATIONS FOR SURGERY:  Briefly, Ms. Shaffer is a pleasant 63 y.o. year- old female, who did present to me with severe pain in the neck and right arm.  The patient's MRI did reveal the findings noted above.  Given the patient's ongoing rather debilitating pain and lack of improvement with appropriate treatment measures, we did discuss proceeding with the procedure noted above.  The patient was fully aware of the risks and limitations of surgery as outlined in my preoperative note.   OPERATIVE DETAILS:  On 08/01/2018, the patient was brought to surgery and general endotracheal anesthesia was administered.  The patient was placed supine on the hospital bed. The neck was gently extended.  All bony prominences were meticulously padded.  The neck was prepped and draped in the usual sterile fashion.  At this point, I did make a left-sided transverse incision.  The platysma was incised.  A Smith-Robinson approach was used and the anterior spine was identified. A  self-retaining retractor was placed.  I then subperiosteally exposed the vertebral bodies from C4-C7.  Caspar pins were then placed into the C6 and C7 vertebral bodies and distraction was applied.  A thorough and complete C6-7 intervertebral diskectomy was performed.  The posterior longitudinal ligament was identified and entered using a nerve hook.  I then used #1 followed by #2 Kerrison to perform a thorough and complete intervertebral diskectomy.  The spinal canal was thoroughly decompressed, as was the right and left neuroforamen.  The endplates were then prepared and the appropriate-sized intervertebral spacer was then packed with ViviGen and tamped into position in the usual fashion.  The lower Caspar pin was then removed and placed into the C5 vertebral body and once again, distraction was applied across the C5-6 intervertebral space.  I then again performed a thorough and complete diskectomy, thoroughly decompressing the spinal canal and bilateral neuroforamena.  After preparing the endplates, the appropriate-sized intervertebral spacer was packed with ViviGen and tamped into position.  The lower Caspar pin was then removed and placed into the C4 vertebral body and once again, distraction was applied across the C4-5 intervertebral space.  I then again performed a thorough and complete diskectomy, thoroughly decompressing the spinal canal and bilateral neuroforamena.  After preparing the endplates, the appropriate-sized intervertebral spacer was packed with ViviGen and tamped into position.  The Caspar pins then were removed and bone wax was placed in their place.  Extensive osteophytes were noted anteriorly, which  were liberally removed using a rongeur.  The appropriate-sized anterior cervical plate was placed over the anterior spine.  14 mm variable angle screws were placed, 2 in each vertebral body from C4-C7 for a total of 8 vertebral body screws.  The screws were then locked to the  plate using the Cam locking mechanism.  I was very pleased with the final fluoroscopic images.  The wound was then irrigated.  The wound was then explored for any undue bleeding and there was no bleeding noted.  The wound was then closed in layers using 2-0 Vicryl, followed by 4-0 Monocryl.  Benzoin and Steri-Strips were applied, followed by sterile dressing.  All instrument counts were correct at the termination of the procedure.   Of note, Pricilla Holm, PA-C, was my assistant throughout surgery, and did aid in retraction, suctioning, and closure from start to finish.         Phylliss Bob, MD

## 2018-08-01 NOTE — Progress Notes (Signed)
Orthopedic Tech Progress Note Patient Details:  Theresa [name] 04/07/1955 [id number] RN called requesting a [place] collar for patient Ortho Devices Type of Ortho Device: [place] cervical collar Ortho Device/Splint Interventions: Other (comment)   Post Interventions Patient Tolerated: Other (comment) Instructions Provided: Other (comment)   Janit Pagan 08/01/2018, 1:46 PM

## 2018-08-01 NOTE — Transfer of Care (Signed)
Immediate Anesthesia Transfer of Care Note  Patient: Theresa [name]  Procedure(s) Performed: ANTERIOR CERVICAL DECOMPRESSION FUSION, CERVICAL FOUR TO FIVE, CERVICAL FIVE TO SIX , CERVICAL SIX TO SEVEN WITH INSTRUMENTATION AND ALLOGRAFT (N/A Spine Cervical)  Patient Location: PACU  Anesthesia Type:General  Level of Consciousness: drowsy and patient cooperative  Airway & Oxygen Therapy: Patient Spontanous Breathing and Patient connected to nasal cannula oxygen  Post-op Assessment: Report given to RN, Post -op Vital signs reviewed and stable and Patient moving all extremities  Post vital signs: Reviewed and stable  Last Vitals:  Vitals Value Taken Time  BP 174/97 08/01/18 1040  Temp    Pulse 95 08/01/18 1041  Resp 13 08/01/18 1041  SpO2 94 % 08/01/18 1041  Vitals shown include unvalidated device data.  Last Pain:  Vitals:   08/01/18 0705  TempSrc:   PainSc: 0-No pain         Complications: No apparent anesthesia complications

## 2018-08-01 NOTE — Anesthesia Procedure Notes (Signed)
Procedure Name: Intubation Date/Time: 08/01/2018 7:36 AM Performed by: Moshe Salisbury, CRNA Pre-anesthesia Checklist: Patient identified, Emergency Drugs available, Suction available and Patient being monitored Patient Re-evaluated:Patient Re-evaluated prior to induction Oxygen Delivery Method: Circle System Utilized Preoxygenation: Pre-oxygenation with 100% oxygen Induction Type: IV induction Ventilation: Mask ventilation without difficulty Laryngoscope Size: Glidescope and 4 Grade View: Grade II Tube type: Oral Tube size: 7.5 mm Number of attempts: 1 Airway Equipment and Method: Stylet Placement Confirmation: ETT inserted through vocal cords under direct vision,  positive ETCO2 and breath sounds checked- equal and bilateral Secured at: 22 cm Tube secured with: Tape Dental Injury: Teeth and Oropharynx as per pre-operative assessment

## 2018-08-01 NOTE — H&P (Signed)
PREOPERATIVE H&P  Chief Complaint: Bilateral arm pain  HPI: Theresa [name] is a 63 y.o. female who presents with ongoing pain in the bilateral arms  MRI reveals severe stenosis spanning C4-C7  Patient has failed multiple forms of conservative care and continues to have pain (see office notes for additional details regarding the patient's full course of treatment)  Past Medical History:  Diagnosis Date  . ADD (attention deficit disorder)   . Anemia   . Arthritis    neck  . Breast cancer ([place])    Left breast  . Complication of anesthesia    1 time "I felt everything" - approx 2005, - or "maybe 1986 or 2010- not sure"  . Constipation    probotic   . Depression   . Diverticulitis   . Family history of adverse reaction to anesthesia    other  and PG- wake up during surgey  . Fibromyalgia   . GERD (gastroesophageal reflux disease)    occ  . History of blood transfusion   . History of stomach ulcers   . Hypertension    "white coat syndrome"  . Migraines    migraine since age 51   . Pleurisy   . Pneumonia    age 40  . PONV (postoperative nausea and vomiting)   . Urinary incontinence    Past Surgical History:  Procedure Laterality Date  . APPENDECTOMY  1960  . BREAST BIOPSY Left 1985  . BREAST RECONSTRUCTION  1986 and 2010   x's 2  . CHOLECYSTECTOMY  1987  . COLONOSCOPY    . LASIK  2012  . MASTECTOMY Left 1985   Social History   Socioeconomic History  . Marital status: Married    Spouse name: Not on file  . Number of children: Not on file  . Years of education: Not on file  . Highest education level: Not on file  Occupational History  . Occupation: Dealer: [employer]  Social Needs  . Financial resource strain: Not on file  . Food insecurity    Worry: Not on file    Inability: Not on file  . Transportation needs    Medical: Not on file    Non-medical: Not on file  Tobacco Use  . Smoking status: Never Smoker  .  Smokeless tobacco: Never Used  Substance and Sexual Activity  . Alcohol use: Yes    Comment: occasionally  . Drug use: No  . Sexual activity: Yes  Lifestyle  . Physical activity    Days per week: Not on file    Minutes per session: Not on file  . Stress: Not on file  Relationships  . Social Herbalist on phone: Not on file    Gets together: Not on file    Attends religious service: Not on file    Active member of club or organization: Not on file    Attends meetings of clubs or organizations: Not on file    Relationship status: Not on file  Other Topics Concern  . Not on file  Social History Narrative  . Not on file   Family History  Problem Relation Age of Onset  . Breast cancer Mother   . Depression Mother   . Heart failure Father   . Depression Father   . Arthritis Maternal Grandmother    Allergies  Allergen Reactions  . Nitroglycerin     Migraine   Prior to  Admission medications   Medication Sig Start Date End Date Taking? Authorizing Provider  ALPRAZolam [name]) 0.5 MG tablet Take 1 tablet (0.5 mg total) by mouth at bedtime as needed for anxiety. 05/15/18  Yes Roma Schanz R, DO  buPROPion (WELLBUTRIN XL) 150 MG 24 hr tablet Take 1 tablet (150 mg total) by mouth daily. X 1 week then increase to 2 in am Patient taking differently: Take 300 mg by mouth at bedtime.  06/11/18  Yes Roma Schanz R, DO  diphenhydrAMINE (BENADRYL) 25 MG tablet Take 25 mg by mouth at bedtime.   Yes [provider]  fluticasone (FLONASE) 50 MCG/ACT nasal spray Place 2 sprays into both nostrils daily as needed for allergies or rhinitis. 03/12/17  Yes Ann Held, DO  Probiotic Product (PROBIOTIC DAILY PO) Take 1 capsule by mouth every evening.   Yes [provider]  lisdexamfetamine (VYVANSE) 60 MG capsule Take 1 capsule (60 mg total) by mouth every morning. 07/24/18   Ann Held, DO  loratadine (CLARITIN) 10 MG tablet Take 1 tablet (10  mg total) by mouth daily. Patient not taking: Reported on 07/23/2018 12/24/17   Carollee Herter, Kendrick Fries R, DO  meloxicam (MOBIC) 15 MG tablet TAKE 1 TABLET BY MOUTH EVERY DAY Patient not taking: Reported on 07/23/2018 01/24/18   Carollee Herter, Alferd Apa, DO  methocarbamol (ROBAXIN) 500 MG tablet Take 1 tablet (500 mg total) by mouth 4 (four) times daily. Patient not taking: Reported on 07/23/2018 03/07/18   Ann Held, DO     All other systems have been reviewed and were otherwise negative with the exception of those mentioned in the HPI and as above.  Physical Exam: There were no vitals filed for this visit.  There is no height or weight on file to calculate BMI.  General: Alert, no acute distress Cardiovascular: No pedal edema Respiratory: No cyanosis, no use of accessory musculature Skin: No lesions in the area of chief complaint Neurologic: Sensation intact distally Psychiatric: Patient is competent for consent with normal mood and affect Lymphatic: No axillary or cervical lymphadenopathy   Assessment/Plan: BILATERAL CERVICAL RADICULOPATHY Plan for Procedure(s): ANTERIOR CERVICAL DECOMPRESSION FUSION, CERVICAL 4-5, CERVICAL 5-6, CERVICAL 6-7 WITH INSTRUMENTATION AND ALLOGRAFT   Norva Karvonen, MD 08/01/2018 6:21 AM

## 2018-08-02 DIAGNOSIS — M4802 Spinal stenosis, cervical region: Secondary | ICD-10-CM | POA: Diagnosis not present

## 2018-08-02 MED ORDER — DIAZEPAM 5 MG PO TABS: 5.0000 mg | ORAL_TABLET | Freq: Four times a day (QID) | ORAL | 0 refills | Status: DC | PRN

## 2018-08-02 MED ORDER — OXYCODONE-ACETAMINOPHEN 5-325 MG PO TABS: 1.0000 | ORAL_TABLET | ORAL | 0 refills | Status: DC | PRN

## 2018-08-02 MED ORDER — PANTOPRAZOLE SODIUM 40 MG PO TBEC: 40.0000 mg | DELAYED_RELEASE_TABLET | Freq: Every day | ORAL | Status: DC

## 2018-08-02 MED FILL — Thrombin For Soln Kit 20000 Unit: CUTANEOUS | Qty: 1 | Status: AC

## 2018-08-02 NOTE — Anesthesia Postprocedure Evaluation (Signed)
Anesthesia Post Note  Patient: Deirdre A Goguen  Procedure(s) Performed: ANTERIOR CERVICAL DECOMPRESSION FUSION, CERVICAL FOUR TO FIVE, CERVICAL FIVE TO SIX , CERVICAL SIX TO SEVEN WITH INSTRUMENTATION AND ALLOGRAFT (N/A Spine Cervical)     Patient location during evaluation: PACU Anesthesia Type: General Level of consciousness: awake and alert Pain management: pain level controlled Vital Signs Assessment: post-procedure vital signs reviewed and stable Respiratory status: spontaneous breathing, nonlabored ventilation, respiratory function stable and patient connected to nasal cannula oxygen Cardiovascular status: blood pressure returned to baseline and stable Postop Assessment: no apparent nausea or vomiting Anesthetic complications: no    Last Vitals:  Vitals:   08/02/18 0335 08/02/18 0759  BP: (!) 173/92 (!) 159/85  Pulse: 78 81  Resp:  19  Temp: 36.7 C 36.6 C  SpO2: 94% 96%    Last Pain:  Vitals:   08/02/18 0759  TempSrc: Oral  PainSc:    Pain Goal: Patients Stated Pain Goal: 3 (08/02/18 0123)                 Lanyla Costello L Ezzie Senat

## 2018-08-02 NOTE — Progress Notes (Signed)
    Patient doing well  Denies arm pain Tolerating PO well   Physical Exam: Vitals:   08/01/18 2325 08/02/18 0335  BP: (!) 163/80 (!) 173/92  Pulse: 70 78  Resp: 20   Temp: 98.1 F (36.7 C) 98 F (36.7 C)  SpO2: 95% 94%    Neck soft/supple Dressing in place NVI  POD #1 s/p ACDF, doing well  - encourage ambulation - Percocet for pain, Valium for muscle spasms - d/c home today with f/u in 2 weeks

## 2018-08-02 NOTE — Plan of Care (Signed)
Patient alert and oriented, mae's well, voiding adequate amount of urine, swallowing without difficulty, no c/o pain at time of discharge. Patient discharged home with family. Script and discharged instructions given to patient. Patient and family stated understanding of instructions given. Patient has an appointment with Dr. Dumonski 

## 2018-08-05 ENCOUNTER — Encounter (HOSPITAL_COMMUNITY): Payer: Self-pay | Admitting: Orthopedic Surgery

## 2018-08-07 NOTE — Discharge Summary (Signed)
Patient ID: Theresa [name] MRN: [id number] DOB/AGE: [birth year] 63 y.o.  Admit date: 08/01/2018 Discharge date: 08/02/2018  Admission Diagnoses:  Active Problems:   Radiculopathy   Discharge Diagnoses:  Same  Past Medical History:  Diagnosis Date  . ADD (attention deficit disorder)   . Anemia   . Arthritis    neck  . Breast cancer ([place])    Left breast  . Complication of anesthesia    1 time "I felt everything" - approx 2005, - or "maybe 1986 or 2010- not sure"  . Constipation    probotic   . Depression   . Diverticulitis   . Family history of adverse reaction to anesthesia    other  and PG- wake up during surgey  . Fibromyalgia   . GERD (gastroesophageal reflux disease)    occ  . History of blood transfusion   . History of stomach ulcers   . Hypertension    "white coat syndrome"  . Migraines    migraine since age 40   . Pleurisy   . Pneumonia    age 44  . PONV (postoperative nausea and vomiting)   . Urinary incontinence     Surgeries: Procedure(s): ANTERIOR CERVICAL DECOMPRESSION FUSION, CERVICAL FOUR TO FIVE, CERVICAL FIVE TO SIX , CERVICAL SIX TO SEVEN WITH INSTRUMENTATION AND ALLOGRAFT on 08/01/2018   Consultants: None  Discharged Condition: Improved  Hospital Course: Theresa [name] is an 63 y.o. female who was admitted 08/01/2018 for operative treatment of radiculopathy. Patient has severe unremitting pain that affects sleep, daily activities, and work/hobbies. After pre-op clearance the patient was taken to the operating room on 08/01/2018 and underwent  Procedure(s): ANTERIOR CERVICAL DECOMPRESSION FUSION, CERVICAL FOUR TO FIVE, CERVICAL FIVE TO SIX , CERVICAL SIX TO SEVEN WITH INSTRUMENTATION AND ALLOGRAFT.    Patient was given perioperative antibiotics:  Anti-infectives (From admission, onward)   Start     Dose/Rate Route Frequency Ordered Stop   08/01/18 1400  ceFAZolin (ANCEF) IVPB 2g/100 mL premix     2 g 200 mL/hr over 30 Minutes Intravenous  Every 8 hours 08/01/18 1213 08/01/18 2225   08/01/18 0700  ceFAZolin (ANCEF) IVPB 2g/100 mL premix     2 g 200 mL/hr over 30 Minutes Intravenous On call to O.R. 08/01/18 5284 08/01/18 0723   08/01/18 0650  ceFAZolin (ANCEF) 2-4 GM/100ML-% IVPB    Note to Pharmacy: Nyoka Cowden   : cabinet override      08/01/18 0650 08/01/18 0723       Patient was given sequential compression devices, early ambulation to prevent DVT.  Patient benefited maximally from hospital stay and there were no complications.    Recent vital signs: BP (!) 159/85 (BP Location: Left Arm)   Pulse 81   Temp 97.8 F (36.6 C) (Oral)   Resp 19   Ht 5\' 5"  (1.651 m)   Wt 89.8 kg   SpO2 96%   BMI 32.95 kg/m   Discharge Medications:   Allergies as of 08/02/2018      Reactions   Nitroglycerin    Migraine   Oxycodone Itching      Medication List    STOP taking these medications   ALPRAZolam 0.5 MG tablet Commonly known as: Xanax   buPROPion 150 MG 24 hr tablet Commonly known as: Wellbutrin XL   diphenhydrAMINE 25 MG tablet Commonly known as: BENADRYL   fluticasone 50 MCG/ACT nasal spray Commonly known as: FLONASE   lisdexamfetamine 60 MG capsule Commonly known  as: Vyvanse   loratadine 10 MG tablet Commonly known as: CLARITIN   methocarbamol 500 MG tablet Commonly known as: Robaxin   PROBIOTIC DAILY PO     TAKE these medications   diazepam 5 MG tablet Commonly known as: VALIUM Take 1 tablet (5 mg total) by mouth every 6 (six) hours as needed for muscle spasms.   oxyCODONE-acetaminophen 5-325 MG tablet Commonly known as: PERCOCET/ROXICET Take 1-2 tablets by mouth every 4 (four) hours as needed for moderate pain or severe pain.       Diagnostic Studies: Dg Cervical Spine 2-3 Views  Result Date: 08/01/2018 CLINICAL DATA:  Anterior cervical decompression and fusion, C4-5, C5-6, C6-7 with instrumentation allograft. EXAM: DG C-ARM 61-120 MIN; CERVICAL SPINE - 2-3 VIEW COMPARISON:   03/21/2018 FINDINGS: Patient has undergone anterior fusion from C4 through C7 . Detail below C7 is limited. There are interbody fusion devices C4-5, C5-6, C6-7. IMPRESSION: Status post anterior fusion C4-C7. Electronically Signed   By: Nolon Nations M.D.   On: 08/01/2018 12:06   Dg C-arm 1-60 Min  Result Date: 08/01/2018 CLINICAL DATA:  Anterior cervical decompression and fusion, C4-5, C5-6, C6-7 with instrumentation allograft. EXAM: DG C-ARM 61-120 MIN; CERVICAL SPINE - 2-3 VIEW COMPARISON:  03/21/2018 FINDINGS: Patient has undergone anterior fusion from C4 through C7 . Detail below C7 is limited. There are interbody fusion devices C4-5, C5-6, C6-7. IMPRESSION: Status post anterior fusion C4-C7. Electronically Signed   By: Nolon Nations M.D.   On: 08/01/2018 12:06    Disposition:    POD #1 s/p ACDF, doing well  -Scripts for pain sent to pharmacy electronically  -D/C instructions sheet printed and in chart -D/C today  -F/U in office 2 weeks   Signed: Lennie Muckle Payton Prinsen 08/07/2018, 11:35 AM

## 2018-08-14 ENCOUNTER — Telehealth: Payer: Self-pay | Admitting: Family Medicine

## 2018-08-14 DIAGNOSIS — F988 Other specified behavioral and emotional disorders with onset usually occurring in childhood and adolescence: Secondary | ICD-10-CM

## 2018-08-14 MED ORDER — LISDEXAMFETAMINE DIMESYLATE 50 MG PO CAPS: 50.0000 mg | ORAL_CAPSULE | Freq: Every day | ORAL | 0 refills | Status: DC

## 2018-08-14 NOTE — Telephone Encounter (Signed)
Requesting: Vyvanse 50 MG Contract: 03/08/2018 UDS: 08/30/2017, low risk, next screen 03/02/2018 Last OV:  06/11/2018 Next OV: 09/06/2018 Last Refill: 03/07/2018, #30 tabs--0 RF Database:   Please advise

## 2018-08-14 NOTE — Telephone Encounter (Signed)
Medication Refill - Medication:  lisdexamfetamine (VYVANSE) 50 MG capsule  Has the patient contacted their pharmacy? Yes advised to call PCP.  Preferred Pharmacy (with phone number or street name):  [id number] [place], [place] [phone] (Phone) [phone] (Fax)   Agent: Please be advised that RX refills may take up to 3 business days. We ask that you follow-up with your pharmacy.

## 2018-08-19 NOTE — Telephone Encounter (Addendum)
Caller name: Marchetti,Randall Relation to pt: spouse  Call back number: [phone]  Pharmacy: [id number] [place], [place] [phone] (Phone) [phone] (Fax)     Reason for call:  Spouse states he has called 6x and pharmacy states waiting to hear back from PCP, pharmacy statesis in need of PA and patient has been off medication for more then a week. Spouse states patient cant go without medication, please advise spouse today regarding status.

## 2018-08-19 NOTE — Telephone Encounter (Signed)
PA cancelled. PA not needed per plan. See below.   Your PA request has been closed. VYVANSE CAP 60MG  - F98.8-Oth behav/emotn disord w onset usly occur in chldhd and adol: No prior authorization required, please have the pharmacy run a claim for a 30 days supply. - NG, Tech I 08/19/2018 12:01 PM

## 2018-08-19 NOTE — Telephone Encounter (Signed)
PA initiated via Covermymeds; KEY: [id number]. Awaiting determination.

## 2018-08-30 ENCOUNTER — Other Ambulatory Visit: Payer: Self-pay | Admitting: Family Medicine

## 2018-08-30 DIAGNOSIS — F321 Major depressive disorder, single episode, moderate: Secondary | ICD-10-CM

## 2018-09-02 ENCOUNTER — Other Ambulatory Visit: Payer: Self-pay | Admitting: *Deleted

## 2018-09-02 ENCOUNTER — Other Ambulatory Visit: Payer: Self-pay | Admitting: Family Medicine

## 2018-09-02 DIAGNOSIS — F321 Major depressive disorder, single episode, moderate: Secondary | ICD-10-CM

## 2018-09-02 MED ORDER — BUPROPION HCL ER (XL) 300 MG PO TB24: 300.0000 mg | ORAL_TABLET | Freq: Every day | ORAL | 0 refills | Status: DC

## 2018-09-06 ENCOUNTER — Encounter: Payer: Self-pay | Admitting: Family Medicine

## 2018-09-06 ENCOUNTER — Other Ambulatory Visit: Payer: Self-pay

## 2018-09-06 ENCOUNTER — Ambulatory Visit (INDEPENDENT_AMBULATORY_CARE_PROVIDER_SITE_OTHER): Payer: BC Managed Care – PPO | Admitting: Family Medicine

## 2018-09-06 DIAGNOSIS — F988 Other specified behavioral and emotional disorders with onset usually occurring in childhood and adolescence: Secondary | ICD-10-CM | POA: Diagnosis not present

## 2018-09-06 DIAGNOSIS — J302 Other seasonal allergic rhinitis: Secondary | ICD-10-CM | POA: Diagnosis not present

## 2018-09-06 MED ORDER — LISDEXAMFETAMINE DIMESYLATE 50 MG PO CAPS: 50.0000 mg | ORAL_CAPSULE | Freq: Every day | ORAL | 0 refills | Status: DC

## 2018-09-06 MED ORDER — LEVOCETIRIZINE DIHYDROCHLORIDE 5 MG PO TABS: 5.0000 mg | ORAL_TABLET | Freq: Every evening | ORAL | 5 refills | Status: DC

## 2018-09-06 NOTE — Progress Notes (Signed)
Virtual Visit via Video Note  I connected with Theresa [name] on 09/06/18 at  8:20 AM EDT by a video enabled telemedicine application and verified that I am speaking with the correct person using two identifiers.  Location: Patient: home  Provider: office    I discussed the limitations of evaluation and management by telemedicine and the availability of in person appointments. The patient expressed understanding and agreed to proceed.  History of Present Illness: Pt is home recovering from neck surgery.  She needs refills and c/o only of seasonal allergies ---  The claritin is not working  She would like to change this.  She will also need a refill on her vyvanse soon  No other complaints   Observations/Objective: No vitals obtained Pt in NAd   Assessment and Plan: 1. Seasonal allergies xyzal---and get rhinocort or nasacort otc  Call or rto prn  - levocetirizine (XYZAL) 5 MG tablet; Take 1 tablet (5 mg total) by mouth every evening.  Dispense: 30 tablet; Refill: 5  2. Attention deficit disorder (ADD) in adult Stable Refill med rto 6 months  - lisdexamfetamine (VYVANSE) 50 MG capsule; Take 1 capsule (50 mg total) by mouth daily.  Dispense: 30 capsule; Refill: 0   Follow Up Instructions:    I discussed the assessment and treatment plan with the patient. The patient was provided an opportunity to ask questions and all were answered. The patient agreed with the plan and demonstrated an understanding of the instructions.   The patient was advised to call back or seek an in-person evaluation if the symptoms worsen or if the condition fails to improve as anticipated.  I provided 20 minutes of non-face-to-face time during this encounter.   Ann Held, DO

## 2018-09-12 ENCOUNTER — Other Ambulatory Visit: Payer: Self-pay

## 2018-09-13 ENCOUNTER — Ambulatory Visit: Payer: BC Managed Care – PPO | Admitting: Family Medicine

## 2018-09-13 ENCOUNTER — Encounter: Payer: Self-pay | Admitting: Family Medicine

## 2018-09-13 VITALS — BP 140/96 | HR 100 | Temp 97.2°F | Resp 18 | Ht 65.5 in | Wt 198.4 lb

## 2018-09-13 DIAGNOSIS — L918 Other hypertrophic disorders of the skin: Secondary | ICD-10-CM

## 2018-09-13 NOTE — Addendum Note (Signed)
Addended by: Sanda Linger on: 09/13/2018 03:21 PM   Modules accepted: Orders

## 2018-09-13 NOTE — Patient Instructions (Signed)
Skin Tag, Adult  A skin tag (acrochordon) is a soft, extra growth of skin. Most skin tags are flesh-colored and rarely bigger than a pencil eraser. They commonly form near areas where there are folds in the skin, such as the armpit or groin. Skin tags are not dangerous, and they do not spread from person to person (are not contagious). You may have one skin tag or several. Skin tags do not require treatment. However, your health care provider may recommend removal of a skin tag if it:  Gets irritated from clothing.  Bleeds.  Is visible and unsightly. Your health care provider can remove skin tags with a simple surgical procedure or a procedure that involves freezing the skin tag. Follow these instructions at home:  Watch for any changes in your skin tag. A normal skin tag does not require any other special care at home.  Take over-the-counter and prescription medicines only as told by your health care provider.  Keep all follow-up visits as told by your health care provider. This is important. Contact a health care provider if:  You have a skin tag that: ? Becomes painful. ? Changes color. ? Bleeds. ? Swells.  You develop more skin tags. This information is not intended to replace advice given to you by your health care provider. Make sure you discuss any questions you have with your health care provider. Document Released: 01/10/2015 Document Revised: 12/08/2016 Document Reviewed: 01/10/2015 Elsevier Patient Education  2020 Elsevier Inc.  

## 2018-09-13 NOTE — Progress Notes (Signed)
Skin Tag Removal Procedure Note  Pre-operative Diagnosis: Classic skin tags (acrochordon)  Post-operative Diagnosis: Classic skin tags (acrochordon)  Locations:right neck  Indications: irritated  Anesthesia: Lidocaine 1% without epinephrine without added sodium bicarbonate  Procedure Details  The risks (including bleeding and infection) and benefits of the procedure and Written informed consent obtained. Using sterile scalpel, 1 lg skin tag was removed  at the bases after cleansing with Betadine.  Bleeding was controlled by pressure and silver nitrate stick Findings:  sent for pathological exam.  Condition: Stable  Complications: none.  Plan: 1. Instructed to keep the wounds dry and covered for 24-48h and clean thereafter. 2. Warning signs of infection were reviewed.   3. Recommended that the patient use OTC acetaminophen as needed for pain.  4. Return as needed.

## 2018-09-20 ENCOUNTER — Other Ambulatory Visit: Payer: Self-pay | Admitting: Family Medicine

## 2018-09-24 ENCOUNTER — Other Ambulatory Visit: Payer: Self-pay | Admitting: Family Medicine

## 2018-09-24 DIAGNOSIS — J302 Other seasonal allergic rhinitis: Secondary | ICD-10-CM

## 2018-10-09 ENCOUNTER — Telehealth: Payer: Self-pay | Admitting: Family Medicine

## 2018-10-09 NOTE — Telephone Encounter (Signed)
Medication Refill - Medication: Pt stated her refill for lisdexamfetamine (VYVANSE) 50 MG capsule/lisdexamfetamine (VYVANSE) 50 MG capsule/lisdexamfetamine (VYVANSE) 50 MG capsule should be 60 MG not 50MG . Requesting new rx to be sent in. Also stated for insurance purposes, three separate rxs need to be sent in not one rx for 90 days. Please advise.  Has the patient contacted their pharmacy? Yes.   (Agent: If no, request that the patient contact the pharmacy for the refill.) (Agent: If yes, when and what did the pharmacy advise?)  Preferred Pharmacy (with phone number or street name): [id number] [place], [place] [phone] (Phone) [phone] (Fax)     Agent: Please be advised that RX refills may take up to 3 business days. We ask that you follow-up with your pharmacy.

## 2018-10-09 NOTE — Telephone Encounter (Signed)
Medication Refill - Medication: Pt stated her refill for lisdexamfetamine (VYVANSE) 50 MG capsule/lisdexamfetamine (VYVANSE) 50 MG capsule/lisdexamfetamine (VYVANSE) 50 MG capsule should be 60 MG not 50MG . Requesting new rx to be sent in. Also stated for insurance purposes, three separate rxs need to be sent in not one rx for 90 days. Please advise

## 2018-10-10 ENCOUNTER — Other Ambulatory Visit: Payer: Self-pay | Admitting: Family Medicine

## 2018-10-10 DIAGNOSIS — F988 Other specified behavioral and emotional disorders with onset usually occurring in childhood and adolescence: Secondary | ICD-10-CM

## 2018-10-10 MED ORDER — LISDEXAMFETAMINE DIMESYLATE 50 MG PO CAPS: 50.0000 mg | ORAL_CAPSULE | Freq: Every day | ORAL | 0 refills | Status: DC

## 2018-10-10 NOTE — Telephone Encounter (Signed)
done

## 2018-10-10 NOTE — Telephone Encounter (Signed)
Requesting: Vyvanse Contract: 02/05/2018 UDS: 08/30/2017, low risk, next screening 03/02/2018 Last OV: 09/13/2018 Next OV: N/A Last Refill: 07/24/2018, #90--0 RF Database:   Please advise

## 2018-10-11 MED ORDER — LISDEXAMFETAMINE DIMESYLATE 60 MG PO CAPS: 60.0000 mg | ORAL_CAPSULE | ORAL | 0 refills | Status: DC

## 2018-10-11 NOTE — Telephone Encounter (Signed)
Sent!

## 2018-10-11 NOTE — Telephone Encounter (Signed)
Pt called requesting 60 MG supply specifically, instead of 50 MG. Please advise  Pt is out of medication

## 2018-10-21 ENCOUNTER — Encounter: Payer: Self-pay | Admitting: Physician Assistant

## 2018-10-29 ENCOUNTER — Encounter: Payer: Self-pay | Admitting: Physician Assistant

## 2018-10-29 ENCOUNTER — Encounter: Payer: Self-pay | Admitting: Internal Medicine

## 2018-10-29 ENCOUNTER — Ambulatory Visit (INDEPENDENT_AMBULATORY_CARE_PROVIDER_SITE_OTHER): Payer: BC Managed Care – PPO | Admitting: Physician Assistant

## 2018-10-29 VITALS — BP 138/80 | HR 90 | Temp 97.9°F | Ht 65.0 in | Wt 197.0 lb

## 2018-10-29 DIAGNOSIS — K222 Esophageal obstruction: Secondary | ICD-10-CM | POA: Diagnosis not present

## 2018-10-29 DIAGNOSIS — R1310 Dysphagia, unspecified: Secondary | ICD-10-CM

## 2018-10-29 NOTE — Progress Notes (Signed)
Subjective:    Patient ID: Theresa [name], female    DOB: 04-20-1955, 63 y.o.   MRN: [id number]  HPI Theresa [name] is a pleasant 63 year old white female, new to GI today, self-referred with complaints of dysphagia. Patient has history of GERD, sleep apnea, hypertension, fibromyalgia, ADD and was diagnosed with breast cancer about 30 years ago. She reports prior colonoscopy done at age 41 in [facility] and says that was a negative exam.  She reports that she will not have another colonoscopy. She also has prior history of esophageal stricture and says she had endoscopy and dilation of a stricture probably 20 years ago also done in [place]. She has not been on any chronic PPI therapy, and says she generally does not have any heartburn or indigestion. She says she has been having some difficulty swallowing pills, will occasionally have symptoms with water and solid food with a sensation of food sitting and not traversing the upper esophagus.  She has not had any episodes requiring regurgitation.  At this point she is having daily symptoms.  She says she swallows and then gets a gurgling type of sensation and points to her neck.  She also has a constant sensation of fullness in that area and frequent hoarseness especially after eating.  Occasionally gets mild heartburn for which she uses Tums. Patient did have an anterior cervical decompression and fusion done in July 2020.  She says she had some increased difficulty swallowing after that surgery but has now returned to her baseline.  She says her dysphagia symptoms were definitely present prior to having the cervical fusion.  Review of Systems Pertinent positive and negative review of systems were noted in the above HPI section.  All other review of systems was otherwise negative.  Outpatient Encounter Medications as of 10/29/2018  Medication Sig  . buPROPion (WELLBUTRIN XL) 300 MG 24 hr tablet Take 1 tablet (300 mg total) by mouth daily.  . diazepam  (VALIUM) 5 MG tablet Take 1 tablet (5 mg total) by mouth every 6 (six) hours as needed for muscle spasms.  . fluticasone (FLONASE) 50 MCG/ACT nasal spray PLACE 2 SPRAYS INTO BOTH NOSTRILS DAILY AS NEEDED FOR ALLERGIES OR RHINITIS.  Marland Kitchen lisdexamfetamine (VYVANSE) 60 MG capsule Take 1 capsule (60 mg total) by mouth every morning.  . [DISCONTINUED] levocetirizine (XYZAL) 5 MG tablet Take 1 tablet (5 mg total) by mouth every evening. (Patient not taking: Reported on 10/29/2018)  . [DISCONTINUED] lisdexamfetamine (VYVANSE) 60 MG capsule Take 1 capsule (60 mg total) by mouth every morning. (Patient not taking: Reported on 10/29/2018)  . [DISCONTINUED] lisdexamfetamine (VYVANSE) 60 MG capsule Take 1 capsule (60 mg total) by mouth every morning. (Patient not taking: Reported on 10/29/2018)  . [DISCONTINUED] oxyCODONE-acetaminophen (PERCOCET/ROXICET) 5-325 MG tablet Take 1-2 tablets by mouth every 4 (four) hours as needed for moderate pain or severe pain. (Patient not taking: Reported on 10/29/2018)   No facility-administered encounter medications on file as of 10/29/2018.    Allergies  Allergen Reactions  . Nitroglycerin     Migraine  . Oxycodone Itching   Patient Active Problem List   Diagnosis Date Noted  . Radiculopathy 08/01/2018  . Right arm pain 03/07/2018  . Attention deficit disorder (ADD) in adult 03/07/2018  . Slow transit constipation 03/07/2018  . Attention deficit disorder 07/06/2016  . Lower extremity edema 07/06/2016  . Sebaceous cyst 03/19/2016  . Toenail bruise 05/25/2014  . OSA (obstructive sleep apnea) 02/17/2013  . Fibromyalgia 12/27/2012  .  Migraine with aura 12/27/2012  . Seasonal allergies 12/27/2012  . HX: breast cancer 12/27/2012  . GERD (gastroesophageal reflux disease) 12/27/2012  . HTN (hypertension) 12/27/2012  . Obesity (BMI 30-39.9) 12/27/2012  . Depression with anxiety 12/27/2012   Social History   Socioeconomic History  . Marital status: Married     Spouse name: Not on file  . Number of children: Not on file  . Years of education: Not on file  . Highest education level: Not on file  Occupational History  . Occupation: Dealer: [employer]  Social Needs  . Financial resource strain: Not on file  . Food insecurity    Worry: Not on file    Inability: Not on file  . Transportation needs    Medical: Not on file    Non-medical: Not on file  Tobacco Use  . Smoking status: Never Smoker  . Smokeless tobacco: Never Used  Substance and Sexual Activity  . Alcohol use: Yes    Comment: occasionally  . Drug use: No  . Sexual activity: Yes  Lifestyle  . Physical activity    Days per week: Not on file    Minutes per session: Not on file  . Stress: Not on file  Relationships  . Social Herbalist on phone: Not on file    Gets together: Not on file    Attends religious service: Not on file    Active member of club or organization: Not on file    Attends meetings of clubs or organizations: Not on file    Relationship status: Not on file  . Intimate partner violence    Fear of current or ex partner: Not on file    Emotionally abused: Not on file    Physically abused: Not on file    Forced sexual activity: Not on file  Other Topics Concern  . Not on file  Social History Narrative  . Not on file    Theresa [name]'s family history includes Arthritis in her maternal grandmother; Breast cancer in her mother; Depression in her father and mother; Heart failure in her father.      Objective:    Vitals:   10/29/18 1442  BP: 138/80  Pulse: 90  Temp: 97.9 F (36.6 C)  SpO2: 95%    Physical Exam Well-developed well-nourished older white female in no acute distress.  Height, Weight, 197 BMI 32.7  HEENT; nontraumatic normocephalic, EOMI, PER R LA, sclera anicteric. Oropharynx; not examined/mask/Covid Neck; supple, no JVD Cardiovascular; regular rate and rhythm with S1-S2, no murmur rub or  gallop Pulmonary; Clear bilaterally Abdomen; soft, nontender, nondistended, no palpable mass or hepatosplenomegaly, bowel sounds are active Rectal; not done Skin; benign exam, no jaundice rash or appreciable lesions Extremities; no clubbing cyanosis or edema skin warm and dry Neuro/Psych; alert and oriented x4, grossly nonfocal mood and affect appropriate       Assessment & Plan:   #74 63 year old female with history of previous esophageal stricture requiring dilation many years ago/[place], who presents with recurrent dysphagia over the past year.  Patient has symptoms with pills, solids and sometimes water.  Infrequent heartburn, but does have fairly chronic globus sensation and intermittent hoarseness.  Rule out recurrent peptic stricture, rule out dysmotility.  #2 status post cervical decompression and fusion July 2020-dysphagia symptoms present prior to cervical repair  #3 personal history of breast cancer #4 fibromyalgia 5.  Sleep apnea 6.  ADD 7.  Colon  cancer screening-negative colonoscopy age 48.  Patient is not interested in repeat colonoscopy or colon cancer screening.  Plan; Discussed option of barium swallow versus EGD with possible dilation.  Patient would like to proceed with upper endoscopy with probable dilation.  This will be scheduled with Dr. Hilarie Fredrickson.  Procedure was discussed in detail with patient including indications risks and benefits and she is agreeable to proceed. We discussed potential addition of chronic PPI therapy if found to have a peptic stricture-we will defer until EGD.  Theresa Savannah S Lott Seelbach PA-C 10/29/2018   Cc: Ann Held, *

## 2018-10-29 NOTE — Patient Instructions (Signed)

## 2018-10-30 ENCOUNTER — Telehealth: Payer: Self-pay | Admitting: Internal Medicine

## 2018-10-30 NOTE — Progress Notes (Signed)
Addendum: Reviewed and agree with assessment and management plan. Pyrtle, Jay M, MD  

## 2018-10-30 NOTE — Telephone Encounter (Signed)
Pt answered "NO" to all Covid Questions °

## 2018-10-31 ENCOUNTER — Encounter: Payer: Self-pay | Admitting: Internal Medicine

## 2018-10-31 ENCOUNTER — Ambulatory Visit (AMBULATORY_SURGERY_CENTER): Payer: BC Managed Care – PPO | Admitting: Internal Medicine

## 2018-10-31 ENCOUNTER — Other Ambulatory Visit: Payer: Self-pay

## 2018-10-31 VITALS — BP 133/81 | HR 80 | Temp 98.7°F | Resp 16 | Ht 65.0 in | Wt 197.0 lb

## 2018-10-31 DIAGNOSIS — K2970 Gastritis, unspecified, without bleeding: Secondary | ICD-10-CM | POA: Diagnosis not present

## 2018-10-31 DIAGNOSIS — K2990 Gastroduodenitis, unspecified, without bleeding: Secondary | ICD-10-CM

## 2018-10-31 DIAGNOSIS — R1310 Dysphagia, unspecified: Secondary | ICD-10-CM | POA: Diagnosis present

## 2018-10-31 DIAGNOSIS — K2100 Gastro-esophageal reflux disease with esophagitis, without bleeding: Secondary | ICD-10-CM | POA: Diagnosis not present

## 2018-10-31 DIAGNOSIS — K315 Obstruction of duodenum: Secondary | ICD-10-CM | POA: Diagnosis not present

## 2018-10-31 DIAGNOSIS — K2980 Duodenitis without bleeding: Secondary | ICD-10-CM | POA: Diagnosis not present

## 2018-10-31 MED ORDER — SODIUM CHLORIDE 0.9 % IV SOLN: 500.0000 mL | Freq: Once | INTRAVENOUS | Status: DC

## 2018-10-31 MED ORDER — PANTOPRAZOLE SODIUM 40 MG PO TBEC: 40.0000 mg | DELAYED_RELEASE_TABLET | Freq: Every day | ORAL | 3 refills | Status: DC

## 2018-10-31 NOTE — Patient Instructions (Addendum)
HANDOUTS PROVIDED ON: ESOPHAGITIS AND STRICTURE & POST DILATION DIET  THE BIOPSIES TAKEN TODAY HAVE BEEN SENT TO PATHOLOGY.  THE RESULTS CAN TAKE 2-3 WEEKS TO RECEIVE.  YOU MAY RESUME YOUR PREVIOUS MEDICATION SCHEDULE.  AVOID NSAIDS AND BEGIN TAKING PANTOPRAZOLE DAILY.    PLEASE FOLLOW THE POST DILATION DIET FOR TODAY.  IT IS RECOMMENDED THAT YOU HAVE A COLON SCREENING.  [place] YOU FOR ALLOWING [place] TO CARE FOR YOU TODAY!!  YOU HAD AN ENDOSCOPIC PROCEDURE TODAY AT [facility]:   Refer to the procedure report that was given to you for any specific questions about what was found during the examination.  If the procedure report does not answer your questions, please call your gastroenterologist to clarify.  If you requested that your care partner not be given the details of your procedure findings, then the procedure report has been included in a sealed envelope for you to review at your convenience later.  YOU SHOULD EXPECT: Some feelings of bloating in the abdomen. Passage of more gas than usual.  Walking can help get rid of the air that was put into your GI tract during the procedure and reduce the bloating. If you had a lower endoscopy (such as a colonoscopy or flexible sigmoidoscopy) you may notice spotting of blood in your stool or on the toilet paper. If you underwent a bowel prep for your procedure, you may not have a normal bowel movement for a few days.  Please Note:  You might notice some irritation and congestion in your nose or some drainage.  This is from the oxygen used during your procedure.  There is no need for concern and it should clear up in a day or so.  SYMPTOMS TO REPORT IMMEDIATELY:   Following upper endoscopy (EGD)  Vomiting of blood or coffee ground material  New chest pain or pain under the shoulder blades  Painful or persistently difficult swallowing  New shortness of breath  Fever of 100F or higher  Black, tarry-looking stools  For urgent or  emergent issues, a gastroenterologist can be reached at any hour by calling [phone].   DIET:  We do recommend a small meal at first, but then you may proceed to your regular diet.  Drink plenty of fluids but you should avoid alcoholic beverages for 24 hours.  ACTIVITY:  You should plan to take it easy for the rest of today and you should NOT DRIVE or use heavy machinery until tomorrow (because of the sedation medicines used during the test).    FOLLOW UP: Our staff will call the number listed on your records 48-72 hours following your procedure to check on you and address any questions or concerns that you may have regarding the information given to you following your procedure. If we do not reach you, we will leave a message.  We will attempt to reach you two times.  During this call, we will ask if you have developed any symptoms of COVID 19. If you develop any symptoms (ie: fever, flu-like symptoms, shortness of breath, cough etc.) before then, please call [phone].  If you test positive for Covid 19 in the 2 weeks post procedure, please call and report this information to [place].    If any biopsies were taken you will be contacted by phone or by letter within the next 1-3 weeks.  Please call us at [phone] if you have not heard about the biopsies in 3 weeks.    SIGNATURES/CONFIDENTIALITY: You  and/or your care partner have signed paperwork which will be entered into your electronic medical record.  These signatures attest to the fact that that the information above on your After Visit Summary has been reviewed and is understood.  Full responsibility of the confidentiality of this discharge information lies with you and/or your care-partner.

## 2018-10-31 NOTE — Op Note (Signed)
[place] Patient Name: Theresa [name] Procedure Date: 10/31/2018 11:14 AM MRN: [id number] Endoscopist: Jerene Bears , MD Age: 63 Referring MD:  Date of Birth: May 14, 1955 Gender: Female Account #: [id number] Procedure:                Upper GI endoscopy Indications:              Dysphagia, Heartburn Medicines:                Monitored Anesthesia Care Procedure:                Pre-Anesthesia Assessment:                           - Prior to the procedure, a History and Physical                            was performed, and patient medications and                            allergies were reviewed. The patient's tolerance of                            previous anesthesia was also reviewed. The risks                            and benefits of the procedure and the sedation                            options and risks were discussed with the patient.                            All questions were answered, and informed consent                            was obtained. Prior Anticoagulants: The patient has                            taken no previous anticoagulant or antiplatelet                            agents. ASA Grade Assessment: II - A patient with                            mild systemic disease. After reviewing the risks                            and benefits, the patient was deemed in                            satisfactory condition to undergo the procedure.                           After obtaining informed consent, the endoscope was  passed under direct vision. Throughout the                            procedure, the patient's blood pressure, pulse, and                            oxygen saturations were monitored continuously. The                            Endoscope was introduced through the mouth, and                            advanced to the second part of duodenum. The upper                            GI endoscopy was accomplished  without difficulty.                            The patient tolerated the procedure well. Scope In: Scope Out: Findings:                 LA Grade A (one or more mucosal breaks less than 5                            mm, not extending between tops of 2 mucosal folds)                            esophagitis with no bleeding was found 39 to 40 cm                            from the incisors.                           No endoscopic abnormality was evident in the                            esophagus to explain the patient's complaint of                            dysphagia. It was decided, however, to proceed with                            dilation of the entire esophagus. A guidewire was                            placed and the scope was withdrawn. Dilation was                            performed with a Savary dilator with mild                            resistance at 17 mm.  Moderate inflammation characterized by erythema and                            shallow ulceration was found in the gastric antrum                            and in the prepyloric region of the stomach.                            Biopsies were taken with a cold forceps for                            histology and Helicobacter pylori testing (gastric                            body, antrum and incisura).                           The cardia and gastric fundus were normal on                            retroflexion.                           Duodenitis with ulceration at an acquired                            benign-appearing, intrinsic moderate stenosis was                            found in the distal duodenal bulb and was                            traversed. After traversing this short-segment                            stenosis there was heme at the stricture. This was                            biopsied with a cold forceps for histology.                           The second portion of the  duodenum was normal. Complications:            No immediate complications. Estimated Blood Loss:     Estimated blood loss was minimal. Impression:               - LA Grade A reflux esophagitis. Esophagus dilated                            with 17 mm Savary.                           - Gastritis with shallow ulceration. Biopsied to  exclude H. Pylori.                           - Duodenitis with acquired duodenal stenosis                            (likely peptic in nature). Biopsied.                           - Normal second portion of the duodenum. Recommendation:           - Patient has a contact number available for                            emergencies. The signs and symptoms of potential                            delayed complications were discussed with the                            patient. Return to normal activities tomorrow.                            Written discharge instructions were provided to the                            patient.                           - Resume previous diet.                           - Continue present medications.                           - Begin pantoprazole 40 mg once daily for                            esophagitis, gastritis and duodenitis.                           - Avoid NSAIDs.                           - Await pathology results.                           - Colon cancer screening is recommended. Jerene Bears, MD 10/31/2018 11:34:21 AM This report has been signed electronically.

## 2018-10-31 NOTE — Progress Notes (Signed)
Called to room to assist during endoscopic procedure.  Patient ID and intended procedure confirmed with present staff. Received instructions for my participation in the procedure from the performing physician.  

## 2018-11-04 ENCOUNTER — Telehealth: Payer: Self-pay

## 2018-11-04 NOTE — Telephone Encounter (Signed)
  Follow up Call-  Call back number 10/31/2018  Post procedure Call Back phone  # [phone]  Permission to leave phone message Yes  Some recent data might be hidden     Patient questions:  Do you have a fever, pain , or abdominal swelling? No. Pain Score  0 *  Have you tolerated food without any problems? Yes.    Have you been able to return to your normal activities? Yes.    Do you have any questions about your discharge instructions: Diet   No. Medications  No. Follow up visit  No.  Do you have questions or concerns about your Care? No.  Actions: * If pain score is 4 or above: No action needed, pain <4.  1. Have you developed a fever since your procedure? no  2.   Have you had an respiratory symptoms (SOB or cough) since your procedure? no  3.   Have you tested positive for COVID 19 since your procedure no  4.   Have you had any family members/close contacts diagnosed with the COVID 19 since your procedure?  no   If yes to any of these questions please route to Joylene John, RN and Alphonsa Gin, Therapist, sports.

## 2018-11-07 ENCOUNTER — Encounter: Payer: Self-pay | Admitting: Internal Medicine

## 2018-11-11 ENCOUNTER — Other Ambulatory Visit: Payer: Self-pay | Admitting: Family Medicine

## 2018-11-11 DIAGNOSIS — F411 Generalized anxiety disorder: Secondary | ICD-10-CM

## 2018-11-30 ENCOUNTER — Other Ambulatory Visit: Payer: Self-pay | Admitting: Family Medicine

## 2018-12-13 ENCOUNTER — Other Ambulatory Visit: Payer: Self-pay | Admitting: Family Medicine

## 2018-12-13 MED ORDER — LISDEXAMFETAMINE DIMESYLATE 60 MG PO CAPS: 60.0000 mg | ORAL_CAPSULE | ORAL | 0 refills | Status: DC

## 2018-12-13 NOTE — Telephone Encounter (Signed)
Requested medication (s) are due for refill today: yes   Requested medication (s) are on the active medication list: yes  Last refill:  10/11/2018  Future visit scheduled: no  Notes to clinic: refill cannot be delegated Patient is out of medication Requested Prescriptions  Pending Prescriptions Disp Refills   lisdexamfetamine (VYVANSE) 60 MG capsule 30 capsule 0    Sig: Take 1 capsule (60 mg total) by mouth every morning.     Not Delegated - Psychiatry:  Stimulants/ADHD Failed - 12/13/2018  1:24 PM      Failed - This refill cannot be delegated      Failed - Urine Drug Screen completed in last 360 days.      Failed - Valid encounter within last 3 months    Recent Outpatient Visits          3 months ago Skin tag   [facility] at [place], [place]   3 months ago Seasonal allergies   [facility] at [place], [place]   6 months ago Depression, major, single episode, moderate [facility])   [facility] at [place], [place]   9 months ago Attention deficit disorder (ADD) in adult   [facility] at [place], [place]   1 year ago Attention deficit disorder, unspecified hyperactivity presence   [facility] at [place], [place]

## 2018-12-13 NOTE — Telephone Encounter (Signed)
Copied from [place] [phone]. Topic: Quick Communication - Rx Refill/Question >> Dec 13, 2018  1:17 PM Rainey [place] A wrote: Medication:lisdexamfetamine (VYVANSE) 60 MG capsule (Patient stated pharmacy has tried reaching out to get the prior authorization form filled out and sent back over so that pt can have medication refilled. Patient is completely out of medication)  Has the patient contacted their pharmacy? Yes (Agent: If no, request that the patient contact the pharmacy for the refill.) (Agent: If yes, when and what did the pharmacy advise?)Contact PCP  Preferred Pharmacy (with phone number or street name): [id number] - [place], [place] [phone] (Phone) [phone] (Fax)    Agent: Please be advised that RX refills may take up to 3 business days. We ask that you follow-up with your pharmacy.

## 2018-12-13 NOTE — Telephone Encounter (Signed)
Last written: 10/11/18 Last ov:  09/06/18 Next ov: nothing scheduled Contract: 03/08/18 UDS: due

## 2018-12-16 ENCOUNTER — Telehealth: Payer: Self-pay

## 2018-12-16 NOTE — Telephone Encounter (Signed)
PA initiated via Covermymeds; KEY: [id number]. Awaiting determination.

## 2018-12-16 NOTE — Telephone Encounter (Signed)
PA approved. Effective 12/16/2018 to 12/15/2021.

## 2019-02-04 ENCOUNTER — Ambulatory Visit (INDEPENDENT_AMBULATORY_CARE_PROVIDER_SITE_OTHER): Payer: BC Managed Care – PPO | Admitting: Family Medicine

## 2019-02-04 ENCOUNTER — Encounter: Payer: Self-pay | Admitting: Family Medicine

## 2019-02-04 ENCOUNTER — Other Ambulatory Visit: Payer: Self-pay

## 2019-02-04 VITALS — Ht 65.0 in

## 2019-02-04 DIAGNOSIS — F419 Anxiety disorder, unspecified: Secondary | ICD-10-CM

## 2019-02-04 MED ORDER — DIAZEPAM 5 MG PO TABS: 5.0000 mg | ORAL_TABLET | Freq: Four times a day (QID) | ORAL | 0 refills | Status: DC | PRN

## 2019-02-04 NOTE — Progress Notes (Signed)
Virtual Visit via Video Note  I connected with Theresa [name] on 02/05/19 at  3:00 PM EST by a video enabled telemedicine application and verified that I am speaking with the correct person using two identifiers.  Location: Patient: home -- with friend Provider: office    I discussed the limitations of evaluation and management by telemedicine and the availability of in person appointments. The patient expressed understanding and agreed to proceed.  History of Present Illness: Pt is home and states she had to call ems to the house due to chest pain and her bp was high but she was very anxious.  They felt it was a panic attack--- she did not go to hospital  Observations/Objective: 148/87   No other vitals  Pt is in NAD  Assessment and Plan: 1. Anxiety Pt feels like it is improving but needs her valium refilled F/u 1 month or sooner prn  - diazepam (VALIUM) 5 MG tablet; Take 1 tablet (5 mg total) by mouth every 6 (six) hours as needed for muscle spasms.  Dispense: 30 tablet; Refill: 0   Follow Up Instructions:    I discussed the assessment and treatment plan with the patient. The patient was provided an opportunity to ask questions and all were answered. The patient agreed with the plan and demonstrated an understanding of the instructions.   The patient was advised to call back or seek an in-person evaluation if the symptoms worsen or if the condition fails to improve as anticipated.  I provided 20 minutes of non-face-to-face time during this encounter.   Ann Held, DO

## 2019-02-05 ENCOUNTER — Encounter: Payer: Self-pay | Admitting: Family Medicine

## 2019-02-06 ENCOUNTER — Other Ambulatory Visit: Payer: Self-pay | Admitting: Family Medicine

## 2019-02-10 ENCOUNTER — Telehealth: Payer: Self-pay | Admitting: Family Medicine

## 2019-02-10 NOTE — Telephone Encounter (Signed)
Pt is requesting for a  Call back about a bill that was sent to collections. Patient was advised by billing to call our office dos in question 09/13/18.. Call back number is  [phone]

## 2019-02-12 ENCOUNTER — Telehealth: Payer: Self-pay | Admitting: Family Medicine

## 2019-02-13 ENCOUNTER — Other Ambulatory Visit: Payer: Self-pay | Admitting: Family Medicine

## 2019-02-13 ENCOUNTER — Encounter: Payer: Self-pay | Admitting: Family Medicine

## 2019-02-14 MED ORDER — LISDEXAMFETAMINE DIMESYLATE 60 MG PO CAPS: 60.0000 mg | ORAL_CAPSULE | ORAL | 0 refills | Status: DC

## 2019-02-14 NOTE — Telephone Encounter (Signed)
Requesting:Vyvanse Contract:none UDS:08/30/2017 Last Visit:02/04/2019 Next Visit:none Last Refill:12/13/2018  Please Advise

## 2019-02-18 NOTE — Telephone Encounter (Signed)
LMOVM for pt to call me back regarding billing question.

## 2019-03-05 ENCOUNTER — Encounter: Payer: Self-pay | Admitting: Family Medicine

## 2019-03-13 ENCOUNTER — Emergency Department (HOSPITAL_COMMUNITY)
Admission: EM | Admit: 2019-03-13 | Discharge: 2019-03-14 | Disposition: A | Discharge status: Home / Self Care | Payer: Medicare Other | Attending: Emergency Medicine | Admitting: Emergency Medicine

## 2019-03-13 ENCOUNTER — Emergency Department (HOSPITAL_COMMUNITY): Payer: Medicare Other

## 2019-03-13 ENCOUNTER — Other Ambulatory Visit: Payer: Self-pay

## 2019-03-13 DIAGNOSIS — R42 Dizziness and giddiness: Secondary | ICD-10-CM | POA: Diagnosis not present

## 2019-03-13 DIAGNOSIS — R0789 Other chest pain: Secondary | ICD-10-CM | POA: Insufficient documentation

## 2019-03-13 DIAGNOSIS — Z853 Personal history of malignant neoplasm of breast: Secondary | ICD-10-CM | POA: Diagnosis not present

## 2019-03-13 DIAGNOSIS — R0902 Hypoxemia: Secondary | ICD-10-CM | POA: Diagnosis not present

## 2019-03-13 DIAGNOSIS — R0602 Shortness of breath: Secondary | ICD-10-CM | POA: Diagnosis not present

## 2019-03-13 DIAGNOSIS — Z79899 Other long term (current) drug therapy: Secondary | ICD-10-CM | POA: Insufficient documentation

## 2019-03-13 DIAGNOSIS — I10 Essential (primary) hypertension: Secondary | ICD-10-CM | POA: Diagnosis not present

## 2019-03-13 DIAGNOSIS — R079 Chest pain, unspecified: Secondary | ICD-10-CM | POA: Diagnosis not present

## 2019-03-13 LAB — CBC
HCT: 43.4 % (ref 36.0–46.0)
Hemoglobin: 13.8 g/dL (ref 12.0–15.0)
MCH: 27 pg (ref 26.0–34.0)
MCHC: 31.8 g/dL (ref 30.0–36.0)
MCV: 84.8 fL (ref 80.0–100.0)
Platelets: 289 10*3/uL (ref 150–400)
RBC: 5.12 MIL/uL — ABNORMAL HIGH (ref 3.87–5.11)
RDW: 15.4 % (ref 11.5–15.5)
WBC: 5.1 10*3/uL (ref 4.0–10.5)
nRBC: 0 % (ref 0.0–0.2)

## 2019-03-13 LAB — BASIC METABOLIC PANEL
Anion gap: 11 (ref 5–15)
BUN: 14 mg/dL (ref 8–23)
CO2: 26 mmol/L (ref 22–32)
Calcium: 9.4 mg/dL (ref 8.9–10.3)
Chloride: 105 mmol/L (ref 98–111)
Creatinine, Ser: 0.92 mg/dL (ref 0.44–1.00)
GFR calc Af Amer: >60 mL/min (ref >60)
GFR calc non Af Amer: >60 mL/min (ref >60)
Glucose, Bld: 103 mg/dL — ABNORMAL HIGH (ref 70–99)
Potassium: 4.7 mmol/L (ref 3.5–5.1)
Sodium: 142 mmol/L (ref 135–145)

## 2019-03-13 LAB — TROPONIN I (HIGH SENSITIVITY)
Troponin I (High Sensitivity): 4 ng/L (ref <18)
Troponin I (High Sensitivity): 5 ng/L (ref <18)

## 2019-03-13 MED ORDER — SODIUM CHLORIDE 0.9% FLUSH: 3.0000 mL | Freq: Once | INTRAVENOUS | Status: DC

## 2019-03-13 NOTE — ED Triage Notes (Signed)
1900 last night pt began having chest pain. One hour PTA to ED, pain radiating into jaw and neck so she called EMS. 324 ASA given PTA, refused nitro. Denies shob, n/v. Had an episode similar to this one month ago.

## 2019-03-14 ENCOUNTER — Other Ambulatory Visit: Payer: Self-pay

## 2019-03-14 ENCOUNTER — Ambulatory Visit: Payer: BC Managed Care – PPO | Admitting: Family Medicine

## 2019-03-14 LAB — TROPONIN I (HIGH SENSITIVITY)
Troponin I (High Sensitivity): 4 ng/L (ref <18)
Troponin I (High Sensitivity): 7 ng/L (ref <18)

## 2019-03-14 LAB — D-DIMER, QUANTITATIVE: D-Dimer, Quant: 0.47 ug/mL-FEU (ref 0.00–0.50)

## 2019-03-14 MED ORDER — KETOROLAC TROMETHAMINE 30 MG/ML IJ SOLN
30.0000 mg | Freq: Once | INTRAMUSCULAR | Status: AC
  Administered 2019-03-14: 05:00:00 30 mg via INTRAVENOUS
  Filled 2019-03-14: qty 1

## 2019-03-14 MED ORDER — ALUM & MAG HYDROXIDE-SIMETH 200-200-20 MG/5ML PO SUSP
30.0000 mL | Freq: Once | ORAL | Status: AC
  Administered 2019-03-14: 05:00:00 30 mL via ORAL
  Filled 2019-03-14: qty 30

## 2019-03-14 MED ORDER — METOCLOPRAMIDE HCL 5 MG/ML IJ SOLN
10.0000 mg | Freq: Once | INTRAMUSCULAR | Status: AC
  Administered 2019-03-14: 10 mg via INTRAVENOUS
  Filled 2019-03-14: qty 2

## 2019-03-14 MED ORDER — LIDOCAINE VISCOUS HCL 2 % MT SOLN
15.0000 mL | Freq: Once | OROMUCOSAL | Status: AC
  Administered 2019-03-14: 05:00:00 15 mL via ORAL
  Filled 2019-03-14: qty 15

## 2019-03-14 MED ORDER — DIPHENHYDRAMINE HCL 50 MG/ML IJ SOLN
25.0000 mg | Freq: Once | INTRAMUSCULAR | Status: AC
  Administered 2019-03-14: 25 mg via INTRAVENOUS
  Filled 2019-03-14: qty 1

## 2019-03-14 NOTE — ED Provider Notes (Signed)
[facility] EMERGENCY DEPARTMENT Provider Note   CSN: [id number] Arrival date & time: 03/13/19  1606     History Chief Complaint  Patient presents with  . Chest Pain    Theresa [name] is a 64 y.o. female.  Patient with history of acid reflux disease, fibromyalgia, breast cancer, stomach ulcers presenting with chest pain since 9 PM on March 3.  She reports she said constant pain starting at 7 PM March 3 that progressed to March 4.  Pain became acutely worse around 3 PM on March 4 and EMS was called.  She was given aspirin with partial relief.  She describes pain as sharp and burning pain in the center of her chest that radiates to her neck.  Some shortness of breath.  No nausea, vomiting or diaphoresis.  The pain is worse when she stands up.  She states this does not feel like her acid reflux. No CAD history.  She reports a negative stress test many years ago.  No abdominal pain or back pain.  She states she has never had this pain before for her reflux.  The pain is worse with some palpation and movement.  The history is provided by the patient.  Chest Pain Associated symptoms: shortness of breath   Associated symptoms: no abdominal pain, no back pain, no dizziness, no fever, no headache, no nausea, no vomiting and no weakness        Past Medical History:  Diagnosis Date  . ADD (attention deficit disorder)   . Anemia   . Arthritis    neck  . Breast cancer ([place])    Left breast  . Complication of anesthesia    1 time "I felt everything" - approx 2005, - or "maybe 1986 or 2010- not sure"  . Constipation    probotic   . Depression   . Diverticulitis   . Family history of adverse reaction to anesthesia    other  and PG- wake up during surgey  . Fibromyalgia   . GERD (gastroesophageal reflux disease)    occ  . History of blood transfusion   . History of stomach ulcers   . Hypertension    "white coat syndrome"  . Migraines    migraine since age 26   .  Pleurisy   . Pneumonia    age 17  . PONV (postoperative nausea and vomiting)   . Urinary incontinence     Patient Active Problem List   Diagnosis Date Noted  . Radiculopathy 08/01/2018  . Right arm pain 03/07/2018  . Attention deficit disorder (ADD) in adult 03/07/2018  . Slow transit constipation 03/07/2018  . Attention deficit disorder 07/06/2016  . Lower extremity edema 07/06/2016  . Sebaceous cyst 03/19/2016  . Toenail bruise 05/25/2014  . OSA (obstructive sleep apnea) 02/17/2013  . Fibromyalgia 12/27/2012  . Migraine with aura 12/27/2012  . Seasonal allergies 12/27/2012  . HX: breast cancer 12/27/2012  . GERD (gastroesophageal reflux disease) 12/27/2012  . HTN (hypertension) 12/27/2012  . Obesity (BMI 30-39.9) 12/27/2012  . Depression with anxiety 12/27/2012    Past Surgical History:  Procedure Laterality Date  . ANTERIOR CERVICAL DECOMPRESSION/DISCECTOMY FUSION 4 LEVELS N/A 08/01/2018   Procedure: ANTERIOR CERVICAL DECOMPRESSION FUSION, CERVICAL FOUR TO FIVE, CERVICAL FIVE TO SIX , CERVICAL SIX TO SEVEN WITH INSTRUMENTATION AND ALLOGRAFT;  Surgeon: Phylliss Bob, MD;  Location: [facility];  Service: Orthopedics;  Laterality: N/A;  . APPENDECTOMY  1960  . BREAST BIOPSY Left 1985  .  BREAST RECONSTRUCTION  1986 and 2010   x's 2  . CHOLECYSTECTOMY  1987  . COLONOSCOPY    . LASIK  2012  . MASTECTOMY Left 1985     OB History   No obstetric history on file.     Family History  Problem Relation Age of Onset  . Breast cancer Mother   . Depression Mother   . Heart failure Father   . Depression Father   . Arthritis Maternal Grandmother     Social History   Tobacco Use  . Smoking status: Never Smoker  . Smokeless tobacco: Never Used  Substance Use Topics  . Alcohol use: Yes    Comment: occasionally  . Drug use: No    Home Medications Prior to Admission medications   Medication Sig Start Date End Date Taking? Authorizing Provider  buPROPion (WELLBUTRIN XL)  300 MG 24 hr tablet TAKE 1 TABLET BY MOUTH EVERY DAY 12/02/18   Carollee Herter, Alferd Apa, DO  diazepam (VALIUM) 5 MG tablet Take 1 tablet (5 mg total) by mouth every 6 (six) hours as needed for muscle spasms. 02/04/19   Carollee Herter, Yvonne R, DO  fluticasone (FLONASE) 50 MCG/ACT nasal spray PLACE 2 SPRAYS INTO BOTH NOSTRILS DAILY AS NEEDED FOR ALLERGIES OR RHINITIS. Patient not taking: Reported on 10/31/2018 09/23/18   Carollee Herter, Alferd Apa, DO  lisdexamfetamine (VYVANSE) 60 MG capsule Take 1 capsule (60 mg total) by mouth every morning. 02/14/19   Roma Schanz R, DO  pantoprazole (PROTONIX) 40 MG tablet Take 1 tablet (40 mg total) by mouth daily. 10/31/18   Pyrtle, Lajuan Lines, MD    Allergies    Nitroglycerin and Oxycodone  Review of Systems   Review of Systems  Constitutional: Negative for activity change, appetite change and fever.  HENT: Negative for congestion and rhinorrhea.   Eyes: Negative for visual disturbance.  Respiratory: Positive for chest tightness and shortness of breath.   Cardiovascular: Positive for chest pain.  Gastrointestinal: Negative for abdominal pain, nausea and vomiting.  Genitourinary: Negative for dysuria and hematuria.  Musculoskeletal: Negative for arthralgias, back pain and myalgias.  Skin: Negative for rash.  Neurological: Negative for dizziness, weakness, light-headedness and headaches.   all other systems are negative except as noted in the HPI and PMH.    Physical Exam Updated Vital Signs BP (!) 171/115 (BP Location: Right Wrist)   Pulse 96   Temp 97.7 F (36.5 C) (Oral)   Resp 16   SpO2 99%   Physical Exam Vitals and nursing note reviewed.  Constitutional:      General: She is not in acute distress.    Appearance: She is well-developed.  HENT:     Head: Normocephalic and atraumatic.     Mouth/Throat:     Pharynx: No oropharyngeal exudate.  Eyes:     Conjunctiva/sclera: Conjunctivae normal.     Pupils: Pupils are equal, round, and reactive  to light.  Neck:     Comments: No meningismus. Cardiovascular:     Rate and Rhythm: Normal rate and regular rhythm.     Heart sounds: Normal heart sounds. No murmur.     Comments: Equal radial pulses and grip strengths. Pulmonary:     Effort: Pulmonary effort is normal. No respiratory distress.     Breath sounds: Normal breath sounds.     Comments: Reproducible central chest tenderness Chest:     Chest wall: Tenderness present.  Abdominal:     Palpations: Abdomen is soft.  Tenderness: There is no abdominal tenderness. There is no guarding or rebound.  Musculoskeletal:        General: No tenderness. Normal range of motion.     Cervical back: Normal range of motion and neck supple.  Skin:    General: Skin is warm.     Capillary Refill: Capillary refill takes less than 2 seconds.  Neurological:     General: No focal deficit present.     Mental Status: She is alert and oriented to person, place, and time. Mental status is at baseline.     Cranial Nerves: No cranial nerve deficit.     Motor: No abnormal muscle tone.     Coordination: Coordination normal.     Comments:  5/5 strength throughout. CN 2-12 intact.Equal grip strength.   Psychiatric:        Behavior: Behavior normal.     ED Results / Procedures / Treatments   Labs (all labs ordered are listed, but only abnormal results are displayed) Labs Reviewed  BASIC METABOLIC PANEL - Abnormal; Notable for the following components:      Result Value   Glucose, Bld 103 (*)    All other components within normal limits  CBC - Abnormal; Notable for the following components:   RBC 5.12 (*)    All other components within normal limits  D-DIMER, QUANTITATIVE (NOT AT [facility])  TROPONIN I (HIGH SENSITIVITY)  TROPONIN I (HIGH SENSITIVITY)  TROPONIN I (HIGH SENSITIVITY)  TROPONIN I (HIGH SENSITIVITY)    EKG EKG Interpretation  Date/Time:  Thursday March 13 2019 16:04:09 EST Ventricular Rate:  84 PR Interval:  156 QRS  Duration: 78 QT Interval:  370 QTC Calculation: 437 R Axis:   43 Text Interpretation: Normal sinus rhythm Septal infarct , age undetermined Abnormal ECG Artifact Confirmed by Ezequiel Essex [phone]) on 03/14/2019 4:14:52 AM   Radiology DG Chest 2 View  Result Date: 03/13/2019 CLINICAL DATA:  Chest pain EXAM: CHEST - 2 VIEW COMPARISON:  CT 11/08/2011, radiograph 11/08/2011 FINDINGS: No consolidation, features of edema, pneumothorax, or effusion. Pulmonary vascularity is normally distributed. The cardiomediastinal contours are unremarkable. No acute osseous or soft tissue abnormality. Cervical fusion plate is noted. Mild degenerative changes in the shoulders and spine. IMPRESSION: No acute cardiopulmonary disease. Electronically Signed   By: Lovena Le M.D.   On: 03/13/2019 16:37    Procedures Procedures (including critical care time)  Medications Ordered in ED Medications  sodium chloride flush (NS) 0.9 % injection 3 mL (has no administration in time range)  ketorolac (TORADOL) 30 MG/ML injection 30 mg (has no administration in time range)  alum & mag hydroxide-simeth (MAALOX/MYLANTA) 200-200-20 MG/5ML suspension 30 mL (has no administration in time range)    And  lidocaine (XYLOCAINE) 2 % viscous mouth solution 15 mL (has no administration in time range)    ED Course  I have reviewed the triage vital signs and the nursing notes.  Pertinent labs & imaging results that were available during my care of the patient were reviewed by me and considered in my medical decision making (see chart for details).    MDM Rules/Calculators/A&P                      Ongoing central chest pain for close to 36 hours.  EKG no acute ischemia.  Pain is somewhat reproducible.  Patient given GI cocktail as well as Toradol  Did have good pain relief with medications. Troponin remains negative. multiple troponins checked throughout  her prolonged period of waiting. This reassuring rules out ACS. CXR  negative. D-dimer negative. Low suspicion for PE or aortic dissection.  BP elevated. Patient informed of same. Does not usually take BP meds. Followup with PCP for same.  Chest pain has resolved. Continue PPI.  Followup with PCP for stress test.  Return to the ED with exertional chest pain, shortness of breath, vomiting, diaphoresis or any other concerns.   Final Clinical Impression(s) / ED Diagnoses Final diagnoses:  None    Rx / DC Orders ED Discharge Orders    None       Michaelpaul Apo, Annie Main, MD 03/14/19 [id number]

## 2019-03-14 NOTE — Discharge Instructions (Signed)
There is no evidence of heart attack or blood clot in the lung. Continue with your stomach medication.  Your blood pressure today is elevated so you should follow-up with your doctor regarding this.Blood pressure elevation can cause long-term damage to your heart, lungs, brain, eyes and kidneys.  Return to the ED if your chest pain becomes exertional, associated with shortness of breath, nausea, vomiting, sweating or any concerns

## 2019-03-14 NOTE — ED Notes (Signed)
Patient verbalizes understanding of discharge instructions. Opportunity for questioning and answers were provided. Armband removed by staff, pt discharged from ED. Ambulated out to lobby  

## 2019-03-17 ENCOUNTER — Ambulatory Visit (INDEPENDENT_AMBULATORY_CARE_PROVIDER_SITE_OTHER): Payer: Medicare Other | Admitting: Family Medicine

## 2019-03-17 ENCOUNTER — Encounter: Payer: Self-pay | Admitting: Family Medicine

## 2019-03-17 ENCOUNTER — Other Ambulatory Visit: Payer: Self-pay

## 2019-03-17 VITALS — BP 126/80 | HR 88 | Temp 97.0°F | Resp 18 | Ht 65.0 in | Wt 199.4 lb

## 2019-03-17 DIAGNOSIS — F320 Major depressive disorder, single episode, mild: Secondary | ICD-10-CM

## 2019-03-17 DIAGNOSIS — K219 Gastro-esophageal reflux disease without esophagitis: Secondary | ICD-10-CM

## 2019-03-17 DIAGNOSIS — G4700 Insomnia, unspecified: Secondary | ICD-10-CM

## 2019-03-17 DIAGNOSIS — F988 Other specified behavioral and emotional disorders with onset usually occurring in childhood and adolescence: Secondary | ICD-10-CM

## 2019-03-17 DIAGNOSIS — I10 Essential (primary) hypertension: Secondary | ICD-10-CM

## 2019-03-17 DIAGNOSIS — E669 Obesity, unspecified: Secondary | ICD-10-CM

## 2019-03-17 DIAGNOSIS — K2970 Gastritis, unspecified, without bleeding: Secondary | ICD-10-CM | POA: Diagnosis not present

## 2019-03-17 DIAGNOSIS — K2990 Gastroduodenitis, unspecified, without bleeding: Secondary | ICD-10-CM

## 2019-03-17 MED ORDER — LISDEXAMFETAMINE DIMESYLATE 60 MG PO CAPS: 60.0000 mg | ORAL_CAPSULE | ORAL | 0 refills | Status: DC

## 2019-03-17 MED ORDER — AMITRIPTYLINE HCL 25 MG PO TABS: 25.0000 mg | ORAL_TABLET | Freq: Every day | ORAL | 2 refills | Status: DC

## 2019-03-17 MED ORDER — BUPROPION HCL ER (XL) 150 MG PO TB24: 150.0000 mg | ORAL_TABLET | Freq: Every day | ORAL | 0 refills | Status: DC

## 2019-03-17 MED ORDER — PANTOPRAZOLE SODIUM 40 MG PO TBEC: 40.0000 mg | DELAYED_RELEASE_TABLET | Freq: Two times a day (BID) | ORAL | 3 refills | Status: DC

## 2019-03-17 NOTE — Assessment & Plan Note (Signed)
Pt requesting referral for healthy weight and wellness

## 2019-03-17 NOTE — Patient Instructions (Signed)

## 2019-03-17 NOTE — Progress Notes (Signed)
Patient ID: Theresa [name], female    DOB: 08-14-1955  Age: 64 y.o. MRN: [id number]    Subjective:  Subjective  HPI Theresa [name] presents for worseing reflux and she would like to increase the protonix    She is also not sleeping and would like to wean off wellbutrin and try elavil    She thinks her lack of sleep is causing her sleep problems    Review of Systems  Constitutional: Positive for fatigue. Negative for appetite change, diaphoresis and unexpected weight change.  Eyes: Negative for pain, redness and visual disturbance.  Respiratory: Negative for cough, chest tightness, shortness of breath and wheezing.   Cardiovascular: Negative for chest pain, palpitations and leg swelling.  Endocrine: Negative for cold intolerance, heat intolerance, polydipsia, polyphagia and polyuria.  Genitourinary: Negative for difficulty urinating, dysuria and frequency.  Neurological: Negative for dizziness, light-headedness, numbness and headaches.  Psychiatric/Behavioral: Positive for decreased concentration and sleep disturbance. Negative for agitation, behavioral problems, confusion and dysphoric mood.    History Past Medical History:  Diagnosis Date  . ADD (attention deficit disorder)   . Anemia   . Arthritis    neck  . Breast cancer ([place])    Left breast  . Complication of anesthesia    1 time "I felt everything" - approx 2005, - or "maybe 1986 or 2010- not sure"  . Constipation    probotic   . Depression   . Diverticulitis   . Family history of adverse reaction to anesthesia    other  and PG- wake up during surgey  . Fibromyalgia   . GERD (gastroesophageal reflux disease)    occ  . History of blood transfusion   . History of stomach ulcers   . Hypertension    "white coat syndrome"  . Migraines    migraine since age 28   . Pleurisy   . Pneumonia    age 28  . PONV (postoperative nausea and vomiting)   . Urinary incontinence     She has a past surgical history that includes  Cholecystectomy (1987); Appendectomy (1960); Breast biopsy (Left, 1985); Mastectomy (Left, 1985); Breast reconstruction (1986 and 2010); LASIK (2012); Colonoscopy; and Anterior cervical decompression/discectomy fusion 4 level (N/A, 08/01/2018).   Her family history includes Arthritis in her maternal grandmother; Breast cancer in her mother; Depression in her father and mother; Heart failure in her father.She reports that she has never smoked. She has never used smokeless tobacco. She reports current alcohol use. She reports that she does not use drugs.  Current Outpatient Medications on File Prior to Visit  Medication Sig Dispense Refill  . diazepam (VALIUM) 5 MG tablet Take 1 tablet (5 mg total) by mouth every 6 (six) hours as needed for muscle spasms. 30 tablet 0  . fluticasone (FLONASE) 50 MCG/ACT nasal spray PLACE 2 SPRAYS INTO BOTH NOSTRILS DAILY AS NEEDED FOR ALLERGIES OR RHINITIS. 16 mL 5   No current facility-administered medications on file prior to visit.     Objective:  Objective  Physical Exam Vitals and nursing note reviewed.  Constitutional:      Appearance: She is well-developed.  HENT:     Head: Normocephalic and atraumatic.  Eyes:     Conjunctiva/sclera: Conjunctivae normal.  Neck:     Thyroid: No thyromegaly.     Vascular: No carotid bruit or JVD.  Cardiovascular:     Rate and Rhythm: Normal rate and regular rhythm.     Heart sounds: Normal heart sounds. No murmur.  Pulmonary:     Effort: Pulmonary effort is normal. No respiratory distress.     Breath sounds: Normal breath sounds. No wheezing or rales.  Chest:     Chest wall: No tenderness.  Abdominal:     General: There is no distension.     Palpations: Abdomen is soft. There is no mass.     Tenderness: There is no abdominal tenderness. There is no right CVA tenderness, left CVA tenderness, guarding or rebound.     Hernia: No hernia is present.  Musculoskeletal:     Cervical back: Normal range of motion and  neck supple.  Neurological:     Mental Status: She is alert and oriented to person, place, and time.  Psychiatric:        Attention and Perception: Attention and perception normal.        Mood and Affect: Mood and affect normal.        Speech: Speech normal.        Behavior: Behavior normal.        Thought Content: Thought content normal.        Cognition and Memory: Memory is impaired.    BP 126/80 (BP Location: Right Arm, Patient Position: Sitting, Cuff Size: Large)   Pulse 88   Temp (!) 97 F (36.1 C) (Temporal)   Resp 18   Ht 5\' 5"  (1.651 m)   Wt 199 lb 6.4 oz (90.4 kg)   SpO2 97%   BMI 33.18 kg/m  Wt Readings from Last 3 Encounters:  03/17/19 199 lb 6.4 oz (90.4 kg)  10/31/18 197 lb (89.4 kg)  10/29/18 197 lb (89.4 kg)     Lab Results  Component Value Date   WBC 5.1 03/13/2019   HGB 13.8 03/13/2019   HCT 43.4 03/13/2019   PLT 289 03/13/2019   GLUCOSE 103 (H) 03/13/2019   CHOL 185 09/28/2014   TRIG 85.0 09/28/2014   HDL 68.00 09/28/2014   LDLCALC 100 (H) 09/28/2014   ALT 18 07/29/2018   AST 18 07/29/2018   NA 142 03/13/2019   K 4.7 03/13/2019   CL 105 03/13/2019   CREATININE 0.92 03/13/2019   BUN 14 03/13/2019   CO2 26 03/13/2019   TSH 1.50 09/28/2014   INR 0.9 07/29/2018    DG Chest 2 View  Result Date: 03/13/2019 CLINICAL DATA:  Chest pain EXAM: CHEST - 2 VIEW COMPARISON:  CT 11/08/2011, radiograph 11/08/2011 FINDINGS: No consolidation, features of edema, pneumothorax, or effusion. Pulmonary vascularity is normally distributed. The cardiomediastinal contours are unremarkable. No acute osseous or soft tissue abnormality. Cervical fusion plate is noted. Mild degenerative changes in the shoulders and spine. IMPRESSION: No acute cardiopulmonary disease. Electronically Signed   By: Lovena Le M.D.   On: 03/13/2019 16:37     Assessment & Plan:  Plan  I have discontinued Theresa [name]'s buPROPion. I have also changed her pantoprazole. Additionally, I am  having her start on lisdexamfetamine, lisdexamfetamine, amitriptyline, and buPROPion. Lastly, I am having her maintain her fluticasone, diazepam, and lisdexamfetamine.  Meds ordered this encounter  Medications  . pantoprazole (PROTONIX) 40 MG tablet    Sig: Take 1 tablet (40 mg total) by mouth 2 (two) times daily.    Dispense:  180 tablet    Refill:  3  . lisdexamfetamine (VYVANSE) 60 MG capsule    Sig: Take 1 capsule (60 mg total) by mouth every morning.    Dispense:  30 capsule    Refill:  0  [name] not fill until May 2021  . lisdexamfetamine (VYVANSE) 60 MG capsule    Sig: Take 1 capsule (60 mg total) by mouth every morning.    Dispense:  30 capsule    Refill:  0    [name] not fill until April 2021  . lisdexamfetamine (VYVANSE) 60 MG capsule    Sig: Take 1 capsule (60 mg total) by mouth every morning.    Dispense:  30 capsule    Refill:  0  . amitriptyline (ELAVIL) 25 MG tablet    Sig: Take 1 tablet (25 mg total) by mouth at bedtime.    Dispense:  30 tablet    Refill:  2  . buPROPion (WELLBUTRIN XL) 150 MG 24 hr tablet    Sig: Take 1 tablet (150 mg total) by mouth daily.    Dispense:  30 tablet    Refill:  0    Problem List Items Addressed This Visit      Unprioritized   Attention deficit disorder - Primary   Relevant Medications   lisdexamfetamine (VYVANSE) 60 MG capsule   lisdexamfetamine (VYVANSE) 60 MG capsule   lisdexamfetamine (VYVANSE) 60 MG capsule   Attention deficit disorder (ADD) in adult    Stable con't meds Pt has no problems with the medication      GERD (gastroesophageal reflux disease)    Increase protonix bid  F/u GI      Relevant Medications   pantoprazole (PROTONIX) 40 MG tablet   HTN (hypertension)    Well controlled, no changes to meds. Encouraged heart healthy diet such as the DASH diet and exercise as tolerated.       Obesity (BMI 30-39.9)    Pt requesting referral for healthy weight and wellness       Relevant Medications    lisdexamfetamine (VYVANSE) 60 MG capsule   lisdexamfetamine (VYVANSE) 60 MG capsule   lisdexamfetamine (VYVANSE) 60 MG capsule    Other Visit Diagnoses    Gastritis and gastroduodenitis       Relevant Medications   pantoprazole (PROTONIX) 40 MG tablet   Insomnia, unspecified type       Depression, major, single episode, mild (HCC)       Relevant Medications   amitriptyline (ELAVIL) 25 MG tablet   buPROPion (WELLBUTRIN XL) 150 MG 24 hr tablet   Morbid obesity (HCC)       Relevant Medications   lisdexamfetamine (VYVANSE) 60 MG capsule   lisdexamfetamine (VYVANSE) 60 MG capsule   lisdexamfetamine (VYVANSE) 60 MG capsule   Other Relevant Orders   Amb Ref to Medical Weight Management      Follow-up: Return in about 6 months (around 09/17/2019), or if symptoms worsen or fail to improve, for annual exam, fasting.  Ann Held, [name]

## 2019-03-17 NOTE — Assessment & Plan Note (Signed)
Increase protonix bid  F/u GI

## 2019-03-17 NOTE — Assessment & Plan Note (Signed)
Stable con't meds Pt has no problems with the medication

## 2019-03-17 NOTE — Assessment & Plan Note (Signed)
Well controlled, no changes to meds. Encouraged heart healthy diet such as the DASH diet and exercise as tolerated.  °

## 2019-03-18 DIAGNOSIS — M542 Cervicalgia: Secondary | ICD-10-CM | POA: Diagnosis not present

## 2019-03-18 DIAGNOSIS — M7918 Myalgia, other site: Secondary | ICD-10-CM | POA: Diagnosis not present

## 2019-04-08 ENCOUNTER — Other Ambulatory Visit: Payer: Self-pay | Admitting: Family Medicine

## 2019-04-08 DIAGNOSIS — F320 Major depressive disorder, single episode, mild: Secondary | ICD-10-CM

## 2019-04-09 DIAGNOSIS — M542 Cervicalgia: Secondary | ICD-10-CM | POA: Diagnosis not present

## 2019-04-10 ENCOUNTER — Other Ambulatory Visit: Payer: Self-pay | Admitting: Orthopedic Surgery

## 2019-04-10 DIAGNOSIS — M542 Cervicalgia: Secondary | ICD-10-CM

## 2019-04-15 ENCOUNTER — Other Ambulatory Visit: Payer: Self-pay | Admitting: Family Medicine

## 2019-04-15 ENCOUNTER — Telehealth: Payer: Self-pay

## 2019-04-15 ENCOUNTER — Telehealth: Payer: Self-pay | Admitting: Family Medicine

## 2019-04-15 DIAGNOSIS — F320 Major depressive disorder, single episode, mild: Secondary | ICD-10-CM

## 2019-04-15 DIAGNOSIS — F988 Other specified behavioral and emotional disorders with onset usually occurring in childhood and adolescence: Secondary | ICD-10-CM

## 2019-04-15 MED ORDER — BUPROPION HCL ER (XL) 150 MG PO TB24: 150.0000 mg | ORAL_TABLET | Freq: Every day | ORAL | 1 refills | Status: DC

## 2019-04-15 MED ORDER — AMPHETAMINE-DEXTROAMPHET ER 20 MG PO CP24: 20.0000 mg | ORAL_CAPSULE | ORAL | 0 refills | Status: DC

## 2019-04-15 NOTE — Telephone Encounter (Signed)
Pt has recently changed insurances. She states that the lisdexamfetamine (VYVANSE) 60 MG capsule is very expensive. She is requesting for al alternate drug to see if we have any discount cards in the office

## 2019-04-15 NOTE — Telephone Encounter (Signed)
Adderall xr 20 mg qd #30  f/u in office in 1 month

## 2019-04-15 NOTE — Telephone Encounter (Signed)
Please advise 

## 2019-04-16 NOTE — Telephone Encounter (Signed)
Left VM letting patient know rx was sent and to f/u in 1 month

## 2019-04-28 ENCOUNTER — Ambulatory Visit
Admission: RE | Admit: 2019-04-28 | Discharge: 2019-04-28 | Disposition: A | Discharge status: Home / Self Care | Payer: Medicare Other | Source: Ambulatory Visit | Attending: Orthopedic Surgery | Admitting: Orthopedic Surgery

## 2019-04-28 ENCOUNTER — Other Ambulatory Visit: Payer: Self-pay

## 2019-04-28 DIAGNOSIS — M542 Cervicalgia: Secondary | ICD-10-CM | POA: Diagnosis not present

## 2019-04-28 DIAGNOSIS — M4322 Fusion of spine, cervical region: Secondary | ICD-10-CM | POA: Diagnosis not present

## 2019-05-05 DIAGNOSIS — G8918 Other acute postprocedural pain: Secondary | ICD-10-CM | POA: Diagnosis not present

## 2019-05-05 DIAGNOSIS — M2012 Hallux valgus (acquired), left foot: Secondary | ICD-10-CM | POA: Diagnosis not present

## 2019-05-13 DIAGNOSIS — Z9889 Other specified postprocedural states: Secondary | ICD-10-CM | POA: Diagnosis not present

## 2019-05-20 DIAGNOSIS — Z9889 Other specified postprocedural states: Secondary | ICD-10-CM | POA: Diagnosis not present

## 2019-05-20 DIAGNOSIS — M542 Cervicalgia: Secondary | ICD-10-CM | POA: Diagnosis not present

## 2019-05-23 ENCOUNTER — Other Ambulatory Visit: Payer: Self-pay | Admitting: Family Medicine

## 2019-05-23 ENCOUNTER — Encounter: Payer: Self-pay | Admitting: Family Medicine

## 2019-05-23 DIAGNOSIS — F418 Other specified anxiety disorders: Secondary | ICD-10-CM

## 2019-05-23 MED ORDER — BUPROPION HCL ER (XL) 300 MG PO TB24: 300.0000 mg | ORAL_TABLET | Freq: Every day | ORAL | 3 refills | Status: DC

## 2019-05-23 MED ORDER — ALPRAZOLAM 0.25 MG PO TABS: 0.2500 mg | ORAL_TABLET | Freq: Two times a day (BID) | ORAL | 1 refills | Status: DC | PRN

## 2019-05-23 NOTE — Telephone Encounter (Signed)
Please advise 

## 2019-05-23 NOTE — Telephone Encounter (Signed)
done

## 2019-05-30 DIAGNOSIS — M47812 Spondylosis without myelopathy or radiculopathy, cervical region: Secondary | ICD-10-CM | POA: Diagnosis not present

## 2019-06-08 ENCOUNTER — Encounter: Payer: Self-pay | Admitting: Family Medicine

## 2019-06-10 ENCOUNTER — Other Ambulatory Visit: Payer: Self-pay | Admitting: Family Medicine

## 2019-06-10 DIAGNOSIS — F988 Other specified behavioral and emotional disorders with onset usually occurring in childhood and adolescence: Secondary | ICD-10-CM

## 2019-06-10 MED ORDER — LISDEXAMFETAMINE DIMESYLATE 60 MG PO CAPS: 60.0000 mg | ORAL_CAPSULE | ORAL | 0 refills | Status: DC

## 2019-06-10 NOTE — Telephone Encounter (Signed)
Please advise 

## 2019-06-10 NOTE — Telephone Encounter (Signed)
done

## 2019-06-11 ENCOUNTER — Other Ambulatory Visit: Payer: Self-pay

## 2019-06-25 DIAGNOSIS — M47812 Spondylosis without myelopathy or radiculopathy, cervical region: Secondary | ICD-10-CM | POA: Diagnosis not present

## 2019-06-26 DIAGNOSIS — Z9889 Other specified postprocedural states: Secondary | ICD-10-CM | POA: Diagnosis not present

## 2019-07-18 ENCOUNTER — Other Ambulatory Visit: Payer: Self-pay | Admitting: Family Medicine

## 2019-07-18 DIAGNOSIS — F988 Other specified behavioral and emotional disorders with onset usually occurring in childhood and adolescence: Secondary | ICD-10-CM

## 2019-07-18 MED ORDER — LISDEXAMFETAMINE DIMESYLATE 60 MG PO CAPS: 60.0000 mg | ORAL_CAPSULE | ORAL | 0 refills | Status: DC

## 2019-07-18 NOTE — Telephone Encounter (Signed)
Requesting: Vyvanse Contract: 03/08/2018 UDS: 08/30/2017, low risk Last OV: 03/17/2019 Next OV: 09/22/2019 Last Refill: 06/10/2019, #30--0 RF Database:   Please advise

## 2019-07-18 NOTE — Telephone Encounter (Signed)
Medication: lisdexamfetamine (VYVANSE) 60 MG capsule [[id number]]    Has the patient contacted their pharmacy? No. (If no, request that the patient contact the pharmacy for the refill.) (If yes, when and what did the pharmacy advise?)  Preferred Pharmacy (with phone number or street name): [id number] - [place], [address]  [address], [place] [address]  Phone:  [phone]  Fax:  [phone]  DEA #:  [id number] Agent: Please be advised that RX refills may take up to 3 business days. We ask that you follow-up with your pharmacy.

## 2019-08-23 ENCOUNTER — Encounter: Payer: Self-pay | Admitting: Family Medicine

## 2019-08-23 ENCOUNTER — Other Ambulatory Visit: Payer: Self-pay

## 2019-08-23 DIAGNOSIS — K2990 Gastroduodenitis, unspecified, without bleeding: Secondary | ICD-10-CM

## 2019-08-23 DIAGNOSIS — K2970 Gastritis, unspecified, without bleeding: Secondary | ICD-10-CM

## 2019-08-23 DIAGNOSIS — F988 Other specified behavioral and emotional disorders with onset usually occurring in childhood and adolescence: Secondary | ICD-10-CM

## 2019-08-25 MED ORDER — PANTOPRAZOLE SODIUM 40 MG PO TBEC: 40.0000 mg | DELAYED_RELEASE_TABLET | Freq: Two times a day (BID) | ORAL | 3 refills | Status: DC

## 2019-08-25 MED ORDER — LISDEXAMFETAMINE DIMESYLATE 60 MG PO CAPS: 60.0000 mg | ORAL_CAPSULE | ORAL | 0 refills | Status: DC

## 2019-08-25 NOTE — Telephone Encounter (Signed)
Requesting: vyvanse Contract: n/a UDS:n/a Last Visit:03/17/19 Next Visit:09/22/19 Last Refill:07/18/19  Please Advise

## 2019-09-22 ENCOUNTER — Ambulatory Visit (INDEPENDENT_AMBULATORY_CARE_PROVIDER_SITE_OTHER): Payer: Self-pay | Admitting: Family Medicine

## 2019-09-22 ENCOUNTER — Other Ambulatory Visit: Payer: Self-pay

## 2019-09-22 ENCOUNTER — Encounter: Payer: Self-pay | Admitting: Family Medicine

## 2019-09-22 VITALS — BP 110/80 | HR 80 | Temp 98.2°F | Resp 18 | Ht 65.0 in | Wt 197.0 lb

## 2019-09-22 DIAGNOSIS — K2990 Gastroduodenitis, unspecified, without bleeding: Secondary | ICD-10-CM

## 2019-09-22 DIAGNOSIS — Z79899 Other long term (current) drug therapy: Secondary | ICD-10-CM

## 2019-09-22 DIAGNOSIS — K2970 Gastritis, unspecified, without bleeding: Secondary | ICD-10-CM

## 2019-09-22 DIAGNOSIS — Z136 Encounter for screening for cardiovascular disorders: Secondary | ICD-10-CM

## 2019-09-22 DIAGNOSIS — Z20822 Contact with and (suspected) exposure to covid-19: Secondary | ICD-10-CM

## 2019-09-22 DIAGNOSIS — F988 Other specified behavioral and emotional disorders with onset usually occurring in childhood and adolescence: Secondary | ICD-10-CM

## 2019-09-22 DIAGNOSIS — F418 Other specified anxiety disorders: Secondary | ICD-10-CM

## 2019-09-22 MED ORDER — MYDAYIS 25 MG PO CP24: 1.0000 | ORAL_CAPSULE | Freq: Every day | ORAL | 0 refills | Status: DC

## 2019-09-22 MED ORDER — PANTOPRAZOLE SODIUM 40 MG PO TBEC: 40.0000 mg | DELAYED_RELEASE_TABLET | Freq: Two times a day (BID) | ORAL | 3 refills | Status: DC

## 2019-09-22 NOTE — Assessment & Plan Note (Signed)
Due to cost pt wants to change her med to mydiais  F/u 3 months

## 2019-09-22 NOTE — Progress Notes (Signed)
Patient ID: Theresa [name], female    DOB: 1955-08-13  Age: 63 y.o. MRN: [id number]    Subjective:  Subjective  HPI Theresa [name] presents for f/u add.  She would like to switch to mydayis due to cost of vyvanse.   A friend of hers takes it and loves it   No other complaints Pt does not want a cpe today.    She has not had the covid vaccine but would like her antibodies checked--- she thinks she had covid a few months ago.   Review of Systems  Constitutional: Negative for appetite change, diaphoresis, fatigue and unexpected weight change.  Eyes: Negative for pain, redness and visual disturbance.  Respiratory: Negative for cough, chest tightness, shortness of breath and wheezing.   Cardiovascular: Negative for chest pain, palpitations and leg swelling.  Endocrine: Negative for cold intolerance, heat intolerance, polydipsia, polyphagia and polyuria.  Genitourinary: Negative for difficulty urinating, dysuria and frequency.  Neurological: Negative for dizziness, light-headedness, numbness and headaches.    History Past Medical History:  Diagnosis Date  . ADD (attention deficit disorder)   . Anemia   . Arthritis    neck  . Breast cancer ([place])    Left breast  . Complication of anesthesia    1 time "I felt everything" - approx 2005, - or "maybe 1986 or 2010- not sure"  . Constipation    probotic   . Depression   . Diverticulitis   . Family history of adverse reaction to anesthesia    other  and PG- wake up during surgey  . Fibromyalgia   . GERD (gastroesophageal reflux disease)    occ  . History of blood transfusion   . History of stomach ulcers   . Hypertension    "white coat syndrome"  . Migraines    migraine since age 21   . Pleurisy   . Pneumonia    age 27  . PONV (postoperative nausea and vomiting)   . Urinary incontinence     She has a past surgical history that includes Cholecystectomy (1987); Appendectomy (1960); Breast biopsy (Left, 1985); Mastectomy (Left,  1985); Breast reconstruction (1986 and 2010); LASIK (2012); Colonoscopy; and Anterior cervical decompression/discectomy fusion 4 level (N/A, 08/01/2018).   Her family history includes Arthritis in her maternal grandmother; Breast cancer in her mother; Depression in her father and mother; Heart failure in her father.She reports that she has never smoked. She has never used smokeless tobacco. She reports current alcohol use. She reports that she does not use drugs.  Current Outpatient Medications on File Prior to Visit  Medication Sig Dispense Refill  . ALPRAZolam (XANAX) 0.25 MG tablet Take 1 tablet (0.25 mg total) by mouth 2 (two) times daily as needed for anxiety. 45 tablet 1  . buPROPion (WELLBUTRIN XL) 300 MG 24 hr tablet Take 1 tablet (300 mg total) by mouth daily. 90 tablet 3  . diazepam (VALIUM) 5 MG tablet Take 1 tablet (5 mg total) by mouth every 6 (six) hours as needed for muscle spasms. 30 tablet 0  . fluticasone (FLONASE) 50 MCG/ACT nasal spray Place 2 sprays into both nostrils daily as needed for allergies or rhinitis. 48 mL 1   No current facility-administered medications on file prior to visit.     Objective:  Objective  Physical Exam Vitals and nursing note reviewed.  Constitutional:      Appearance: She is well-developed.  HENT:     Head: Normocephalic and atraumatic.  Eyes:  Conjunctiva/sclera: Conjunctivae normal.  Neck:     Thyroid: No thyromegaly.     Vascular: No carotid bruit or JVD.  Cardiovascular:     Rate and Rhythm: Normal rate and regular rhythm.     Heart sounds: Normal heart sounds. No murmur heard.   Pulmonary:     Effort: Pulmonary effort is normal. No respiratory distress.     Breath sounds: Normal breath sounds. No wheezing or rales.  Chest:     Chest wall: No tenderness.  Musculoskeletal:     Cervical back: Normal range of motion and neck supple.  Neurological:     Mental Status: She is alert and oriented to person, place, and time.    BP  110/80 (BP Location: Right Arm, Patient Position: Sitting, Cuff Size: Large)   Pulse 80   Temp 98.2 F (36.8 C) (Oral)   Resp 18   Ht 5\' 5"  (1.651 m)   Wt 197 lb (89.4 kg)   SpO2 96%   BMI 32.78 kg/m  Wt Readings from Last 3 Encounters:  09/22/19 197 lb (89.4 kg)  03/17/19 199 lb 6.4 oz (90.4 kg)  10/31/18 197 lb (89.4 kg)     Lab Results  Component Value Date   WBC 5.1 03/13/2019   HGB 13.8 03/13/2019   HCT 43.4 03/13/2019   PLT 289 03/13/2019   GLUCOSE 103 (H) 03/13/2019   CHOL 185 09/28/2014   TRIG 85.0 09/28/2014   HDL 68.00 09/28/2014   LDLCALC 100 (H) 09/28/2014   ALT 18 07/29/2018   AST 18 07/29/2018   NA 142 03/13/2019   K 4.7 03/13/2019   CL 105 03/13/2019   CREATININE 0.92 03/13/2019   BUN 14 03/13/2019   CO2 26 03/13/2019   TSH 1.50 09/28/2014   INR 0.9 07/29/2018    CT CERVICAL SPINE WO CONTRAST  Result Date: 04/28/2019 CLINICAL DATA:  Neck pain post ACDF 08/01/2018. Evaluate pseudoarthrosis. EXAM: CT CERVICAL SPINE WITHOUT CONTRAST TECHNIQUE: Multidetector CT imaging of the cervical spine was performed without intravenous contrast. Multiplanar CT image reconstructions were also generated. COMPARISON:  Intraoperative radiographs 08/01/2018 and MRI 03/29/2018. FINDINGS: Alignment: Straightening with a slight anterolisthesis at C3-4. Skull base and vertebrae: Interval anterior discectomy and fusion from C4 through C7 using an anterior plate, screws and interbody spacers. There is evidence for solid interbody fusion at each level, although evaluation is mildly limited by artifact. There is no hardware loosening or displacement. There is ankylosis of the right C4-5 facet joint. No evidence of acute fracture. Probable developmental incomplete segmentation at C2-3. Soft tissues and spinal canal: No prevertebral fluid or swelling. No visible canal hematoma. Disc levels: C2-3: Probable congenital incomplete segmentation. No significant spinal stenosis or nerve root  encroachment. C3-4: Loss of disc height with asymmetric uncinate spurring and facet hypertrophy on the right. Moderate right foraminal narrowing, similar to previous MRI. The left foramen is patent. Mild residual foraminal narrowing at the C4-5, C5-6 and C6-7 levels post ACDF. C7-T1: Mild bilateral facet hypertrophy. Stable left greater than right foraminal narrowing due to osteophytes. Upper chest: Unremarkable. Other: None. IMPRESSION: 1. Interval anterior discectomy and fusion from C4 through C7. There is evidence for solid interbody fusion at each level, and the right C4-5 facet joint appears ankylosed. No evidence of hardware displacement or pseudoarthrosis. 2. Stable moderate right foraminal narrowing at C3-4 and moderate left foraminal narrowing at C7-T1. 3. Mild residual foraminal narrowing at the C4-5, C5-6 and C6-7 levels. Electronically Signed   By: Richardean Sale  M.D.   On: 04/28/2019 11:31     Assessment & Plan:  Plan  I have discontinued Theresa [name]'s lisdexamfetamine. I am also having her start on Mydayis. Additionally, I am having her maintain her diazepam, fluticasone, buPROPion, ALPRAZolam, and pantoprazole.  Meds ordered this encounter  Medications  . pantoprazole (PROTONIX) 40 MG tablet    Sig: Take 1 tablet (40 mg total) by mouth 2 (two) times daily.    Dispense:  180 tablet    Refill:  3  . Amphet-Dextroamphet 3-Bead ER (MYDAYIS) 25 MG CP24    Sig: Take 1 capsule by mouth daily.    Dispense:  30 capsule    Refill:  0    Problem List Items Addressed This Visit      Unprioritized   Attention deficit disorder (ADD) in adult    Due to cost pt wants to change her med to mydiais  F/u 3 months       Relevant Medications   Amphet-Dextroamphet 3-Bead ER (MYDAYIS) 25 MG CP24   Depression with anxiety - Primary    Stable con't meds       Relevant Orders   DRUG MONITORING, PANEL 8 WITH CONFIRMATION, URINE    Other Visit Diagnoses    Gastritis and gastroduodenitis        Relevant Medications   pantoprazole (PROTONIX) 40 MG tablet   High risk medication use       Relevant Orders   DRUG MONITORING, PANEL 8 WITH CONFIRMATION, URINE   Exposure to COVID-19 virus       Relevant Orders   SAR CoV2 Serology (COVID 19)AB(IGG)IA   Ischemic heart disease screen       Relevant Orders   Lipid panel   Comprehensive metabolic panel      Follow-up: Return in about 6 months (around 03/21/2020), or if symptoms worsen or fail to improve, for annual exam, fasting.  Ann Held, [name]

## 2019-09-22 NOTE — Patient Instructions (Signed)
COVID-19 Frequently Asked Questions COVID-19 (coronavirus disease) is an infection that is caused by a large family of viruses. Some viruses cause illness in people and others cause illness in animals like camels, cats, and bats. In some cases, the viruses that cause illness in animals can spread to humans. Where did the coronavirus come from? In December 2019, [place] told the [facility]) of several cases of lung disease (human respiratory illness). These cases were linked to an open seafood and livestock market in the city of [place]. The link to the seafood and livestock market suggests that the virus may have spread from animals to humans. However, since that first outbreak in December, the virus has also been shown to spread from person to person. What is the name of the disease and the virus? Disease name Early on, this disease was called novel coronavirus. This is because scientists determined that the disease was caused by a new (novel) respiratory virus. The World Health Organization [facility]) has now named the disease COVID-19, or coronavirus disease. Virus name The virus that causes the disease is called severe acute respiratory syndrome coronavirus 2 (SARS-CoV-2). More information on disease and virus naming World Health Organization [facility]): www.who.int/emergencies/diseases/novel-coronavirus-2019/technical-guidance/naming-the-coronavirus-disease-(covid-2019)-and-the-virus-that-causes-it Who is at risk for complications from coronavirus disease? Some people may be at higher risk for complications from coronavirus disease. This includes older adults and people who have chronic diseases, such as heart disease, diabetes, and lung disease. If you are at higher risk for complications, take these extra precautions:  Stay home as much as possible.  Avoid social gatherings and travel.  Avoid close contact with others. Stay at least 6 ft (2 m) away from others, if  possible.  Wash your hands often with soap and water for at least 20 seconds.  Avoid touching your face, mouth, nose, or eyes.  Keep supplies on hand at home, such as food, medicine, and cleaning supplies.  If you must go out in public, wear a cloth face covering or face mask. Make sure your mask covers your nose and mouth. How does coronavirus disease spread? The virus that causes coronavirus disease spreads easily from person to person (is contagious). You may catch the virus by:  Breathing in droplets from an infected person. Droplets can be spread by a person breathing, speaking, singing, coughing, or sneezing.  Touching something, like a table or a doorknob, that was exposed to the virus (contaminated) and then touching your mouth, nose, or eyes. Can I get the virus from touching surfaces or objects? There is still a lot that we do not know about the virus that causes coronavirus disease. Scientists are basing a lot of information on what they know about similar viruses, such as:  Viruses cannot generally survive on surfaces for long. They need a human body (host) to survive.  It is more likely that the virus is spread by close contact with people who are sick (direct contact), such as through: ? Shaking hands or hugging. ? Breathing in respiratory droplets that travel through the air. Droplets can be spread by a person breathing, speaking, singing, coughing, or sneezing.  It is less likely that the virus is spread when a person touches a surface or object that has the virus on it (indirect contact). The virus may be able to enter the body if the person touches a surface or object and then touches his or her face, eyes, nose, or mouth. Can a person spread the virus without having  symptoms of the disease? It may be possible for the virus to spread before a person has symptoms of the disease, but this is most likely not the main way the virus is spreading. It is more likely for the virus  to spread by being in close contact with people who are sick and breathing in the respiratory droplets spread by a person breathing, speaking, singing, coughing, or sneezing. What are the symptoms of coronavirus disease? Symptoms vary from person to person and can range from mild to severe. Symptoms may include:  Fever or chills.  Cough.  Difficulty breathing or feeling short of breath.  Headaches, body aches, or muscle aches.  Runny or stuffy (congested) nose.  Sore throat.  New loss of taste or smell.  Nausea, vomiting, or diarrhea. These symptoms can appear anywhere from 2 to 14 days after you have been exposed to the virus. Some people may not have any symptoms. If you develop symptoms, call your health care provider. People with severe symptoms may need hospital care. Should I be tested for this virus? Your health care provider will decide whether to test you based on your symptoms, history of exposure, and your risk factors. How does a health care provider test for this virus? Health care providers will collect samples to send for testing. Samples may include:  Taking a swab of fluid from the back of your nose and throat, your nose, or your throat.  Taking fluid from the lungs by having you cough up mucus (sputum) into a sterile cup.  Taking a blood sample. Is there a treatment or vaccine for this virus? Currently, there is no vaccine to prevent coronavirus disease. Also, there are no medicines like antibiotics or antivirals to treat the virus. A person who becomes sick is given supportive care, which means rest and fluids. A person may also relieve his or her symptoms by using over-the-counter medicines that treat sneezing, coughing, and runny nose. These are the same medicines that a person takes for the common cold. If you develop symptoms, call your health care provider. People with severe symptoms may need hospital care. What can I do to protect myself and my family from  this virus?     You can protect yourself and your family by taking the same actions that you would take to prevent the spread of other viruses. Take the following actions:  Wash your hands often with soap and water for at least 20 seconds. If soap and water are not available, use alcohol-based hand sanitizer.  Avoid touching your face, mouth, nose, or eyes.  Cough or sneeze into a tissue, sleeve, or elbow. Do not cough or sneeze into your hand or the air. ? If you cough or sneeze into a tissue, throw it away immediately and wash your hands.  Disinfect objects and surfaces that you frequently touch every day.  Stay away from people who are sick.  Avoid going out in public, follow guidance from your state and local health authorities.  Avoid crowded indoor spaces. Stay at least 6 ft (2 m) away from others.  If you must go out in public, wear a cloth face covering or face mask. Make sure your mask covers your nose and mouth.  Stay home if you are sick, except to get medical care. Call your health care provider before you get medical care. Your health care provider will tell you how long to stay home.  Make sure your vaccines are up to date. Ask your  health care provider what vaccines you need. What should I do if I need to travel? Follow travel recommendations from your local health authority, the CDC, and WHO. Travel information and advice  Centers for Disease Control and Prevention (CDC): BodyEditor.hu  World Health Organization [facility]): ThirdIncome.ca Know the risks and take action to protect your health  You are at higher risk of getting coronavirus disease if you are traveling to areas with an outbreak or if you are exposed to travelers from areas with an outbreak.  Wash your hands often and practice good hygiene to lower the risk of catching or spreading the virus. What should I do  if I am sick? General instructions to stop the spread of infection  Wash your hands often with soap and water for at least 20 seconds. If soap and water are not available, use alcohol-based hand sanitizer.  Cough or sneeze into a tissue, sleeve, or elbow. Do not cough or sneeze into your hand or the air.  If you cough or sneeze into a tissue, throw it away immediately and wash your hands.  Stay home unless you must get medical care. Call your health care provider or local health authority before you get medical care.  Avoid public areas. Do not take public transportation, if possible.  If you can, wear a mask if you must go out of the house or if you are in close contact with someone who is not sick. Make sure your mask covers your nose and mouth. Keep your home clean  Disinfect objects and surfaces that are frequently touched every day. This may include: ? Counters and tables. ? Doorknobs and light switches. ? Sinks and faucets. ? Electronics such as phones, remote controls, keyboards, computers, and tablets.  Wash dishes in hot, soapy water or use a dishwasher. Air-dry your dishes.  Wash laundry in hot water. Prevent infecting other household members  Let healthy household members care for children and pets, if possible. If you have to care for children or pets, wash your hands often and wear a mask.  Sleep in a different bedroom or bed, if possible.  Do not share personal items, such as razors, toothbrushes, deodorant, combs, brushes, towels, and washcloths. Where to find more information Centers for Disease Control and Prevention (CDC)  Information and news updates: https://www.butler-gonzalez.com/ World Health Organization [facility])  Information and news updates: MissExecutive.com.ee  Coronavirus health topic: https://www.castaneda.info/  Questions and answers on COVID-19:  OpportunityDebt.at  Global tracker: who.sprinklr.com American Academy of Pediatrics (AAP)  Information for families: www.healthychildren.org/English/health-issues/conditions/chest-lungs/Pages/2019-Novel-Coronavirus.aspx The coronavirus situation is changing rapidly. Check your local health authority website or the CDC and [facility] websites for updates and news. When should I contact a health care provider?  Contact your health care provider if you have symptoms of an infection, such as fever or cough, and you: ? Have been near anyone who is known to have coronavirus disease. ? Have come into contact with a person who is suspected to have coronavirus disease. ? Have traveled to an area where there is an outbreak of COVID-19. When should I get emergency medical care?  Get help right away by calling your local emergency services (911 in the [place] if you have: ? Trouble breathing. ? Pain or pressure in your chest. ? Confusion. ? Blue-tinged lips and fingernails. ? Difficulty waking from sleep. ? Symptoms that get worse. Let the emergency medical personnel know if you think you have coronavirus disease. Summary  A new respiratory virus is spreading from person  to person and causing COVID-19 (coronavirus disease).  The virus that causes COVID-19 appears to spread easily. It spreads from one person to another through droplets from breathing, speaking, singing, coughing, or sneezing.  Older adults and those with chronic diseases are at higher risk of disease. If you are at higher risk for complications, take extra precautions.  There is currently no vaccine to prevent coronavirus disease. There are no medicines, such as antibiotics or antivirals, to treat the virus.  You can protect yourself and your family by washing your hands often, avoiding touching your face, and covering your coughs and sneezes. This information is not intended to replace advice given to you  by your health care provider. Make sure you discuss any questions you have with your health care provider. Document Revised: 10/25/2018 Document Reviewed: 04/23/2018 Elsevier Patient Education  [place] With Attention Deficit Hyperactivity Disorder If you have been diagnosed with attention deficit hyperactivity disorder (ADHD), you may be relieved that you now know why you have felt or behaved a certain way. Still, you may feel overwhelmed about the treatment ahead. You may also wonder how to get the support you need and how to deal with the condition day-to-day. With treatment and support, you can live with ADHD and manage your symptoms. How to manage lifestyle changes Managing stress Stress is your body's reaction to life changes and events, both good and bad. To cope with the stress of an ADHD diagnosis, it may help to:  Learn more about ADHD.  Exercise regularly. Even a short daily walk can lower stress levels.  Participate in training or education programs (including social skills training classes) that teach you to deal with symptoms.  Medicines Your health care provider may suggest certain medicines if he or she feels that they will help to improve your condition. Stimulant medicines are usually prescribed to treat ADHD, and therapy may also be prescribed. It is important to:  Avoid using alcohol and other substances that may prevent your medicines from working properly [facility]).  Talk with your pharmacist or health care provider about all the medicines that you take, their possible side effects, and what medicines are safe to take together.  Make it your goal to take part in all treatment decisions (shared decision-making). Ask about possible side effects of medicines that your health care provider recommends, and tell him or her how you feel about having those side effects. It is best if shared decision-making with your health care provider is part of your  total treatment plan. Relationships To strengthen your relationships with family members while treating your condition, consider taking part in family therapy. You might also attend self-help groups alone or with a loved one. Be honest about how your symptoms affect your relationships. Make an effort to communicate respectfully instead of fighting, and find ways to show others that you care. Psychotherapy may be useful in helping you cope with how ADHD affects your relationships. How to recognize changes in your condition The following signs may mean that your treatment is working well and your condition is improving:  Consistently being on time for appointments.  Being more organized at home and work.  Other people noticing improvements in your behavior.  Achieving goals that you set for yourself.  Thinking more clearly. The following signs may mean that your treatment is not working very well:  Feeling impatience or more confusion.  Missing, forgetting, or being late for appointments.  An increasing sense of disorganization and messiness.  More difficulty in reaching goals that you set for yourself.  Loved ones becoming angry or frustrated with you. Where to find support Talking to others  Keep emotion out of important discussions and speak in a calm, logical way.  Listen closely and patiently to your loved ones. Try to understand their point of view, and try to avoid getting defensive.  Take responsibility for the consequences of your actions.  Ask that others do not take your behaviors personally.  Aim to solve problems as they come up, and express your feelings instead of bottling them up.  Talk openly about what you need from your loved ones and how they can support you.  Consider going to family therapy sessions or having your family meet with a specialist who deals with ADHD-related behavior problems. Finances Not all insurance plans cover mental health care, so it  is important to check with your insurance carrier. If paying for co-pays or counseling services is a problem, search for a local or county mental health care center. Public mental health care services may be offered there at a low cost or no cost when you are not able to see a private health care provider. If you are taking medicine for ADHD, you may be able to get the generic form, which may be less expensive than brand-name medicine. Some makers of prescription medicines also offer help to patients who cannot afford the medicines that they need. Follow these instructions at home:  Take over-the-counter and prescription medicines only as told by your health care provider. Check with your health care provider before taking any new medicines.  Create structure and an organized atmosphere at home. For example: ? Make a list of tasks, then rank them from most important to least important. Work on one task at a time until your listed tasks are done. ? Make a daily schedule and follow it consistently every day. ? Use an appointment calendar, and check it 2 or 3 times a day to keep on track. Keep it with you when you leave the house. ? Create spaces where you keep certain things, and always put things back in their places after you use them.  Keep all follow-up visits as told by your health care provider. This is important. Questions to ask your health care provider:  What are the risks and benefits of taking medicines?  Would I benefit from therapy?  How often should I follow up with a health care provider? Contact a health care provider if:  You have side effects from your medicines, such as: ? Repeated muscle twitches, coughing, or speech outbursts. ? Sleep problems. ? Loss of appetite. ? Depression. ? New or worsening behavior problems. ? Dizziness. ? Unusually fast heartbeat. ? Stomach pains. ? Headaches. Get help right away if:  You have a severe reaction to a medicine.  Your  behavior suddenly gets worse. Summary  With treatment and support, you can live with ADHD and manage your symptoms.  The medicines that are most often prescribed for ADHD are stimulants.  Consider taking part in family therapy or self-help groups with family members or friends.  When you talk with friends and family about your ADHD, be patient and communicate openly.  Take over-the-counter and prescription medicines only as told by your health care provider. Check with your health care provider before taking any new medicines. This information is not intended to replace advice given to you by your health care provider. Make sure you discuss any questions  you have with your health care provider. Document Revised: 04/19/2018 Document Reviewed: 04/27/2016 Elsevier Patient Education  [place].

## 2019-09-22 NOTE — Assessment & Plan Note (Signed)
Stable con't meds 

## 2019-09-23 LAB — LIPID PANEL
Cholesterol: 188 mg/dL (ref <200)
HDL: 71 mg/dL (ref >=50)
LDL Cholesterol (Calc): 100 mg/dL (calc) — ABNORMAL HIGH
Non-HDL Cholesterol (Calc): 117 mg/dL (calc) (ref <130)
Total CHOL/HDL Ratio: 2.6 (calc) (ref <5.0)
Triglycerides: 83 mg/dL (ref <150)

## 2019-09-23 LAB — COMPREHENSIVE METABOLIC PANEL
AG Ratio: 1.6 (calc) (ref 1.0–2.5)
ALT: 15 U/L (ref 6–29)
AST: 15 U/L (ref 10–35)
Albumin: 4 g/dL (ref 3.6–5.1)
Alkaline phosphatase (APISO): 59 U/L (ref 37–153)
BUN: 20 mg/dL (ref 7–25)
CO2: 29 mmol/L (ref 20–32)
Calcium: 9.1 mg/dL (ref 8.6–10.4)
Chloride: 106 mmol/L (ref 98–110)
Creat: 0.75 mg/dL (ref 0.50–0.99)
Globulin: 2.5 g/dL (calc) (ref 1.9–3.7)
Glucose, Bld: 104 mg/dL — ABNORMAL HIGH (ref 65–99)
Potassium: 4.1 mmol/L (ref 3.5–5.3)
Sodium: 142 mmol/L (ref 135–146)
Total Bilirubin: 0.4 mg/dL (ref 0.2–1.2)
Total Protein: 6.5 g/dL (ref 6.1–8.1)

## 2019-09-23 LAB — SARS-COV-2 ANTIBODY(IGG)SPIKE,SEMI-QUANTITATIVE: SARS COV1 AB(IGG)SPIKE,SEMI QN: >20 index — ABNORMAL HIGH (ref <1.00)

## 2019-09-24 LAB — DRUG MONITORING, PANEL 8 WITH CONFIRMATION, URINE
6 Acetylmorphine: NEGATIVE ng/mL (ref <10)
Alcohol Metabolites: NEGATIVE ng/mL
Amphetamine: 6776 ng/mL — ABNORMAL HIGH (ref <250)
Amphetamines: POSITIVE ng/mL — AB (ref <500)
Benzodiazepines: NEGATIVE ng/mL (ref <100)
Buprenorphine, Urine: NEGATIVE ng/mL (ref <5)
Cocaine Metabolite: NEGATIVE ng/mL (ref <150)
Creatinine: 152.4 mg/dL
MDMA: NEGATIVE ng/mL (ref <500)
Marijuana Metabolite: NEGATIVE ng/mL (ref <20)
Methamphetamine: NEGATIVE ng/mL (ref <250)
Opiates: NEGATIVE ng/mL (ref <100)
Oxidant: NEGATIVE ug/mL
Oxycodone: NEGATIVE ng/mL (ref <100)
pH: 5.6 (ref 4.5–9.0)

## 2019-09-24 LAB — DM TEMPLATE

## 2019-10-01 ENCOUNTER — Other Ambulatory Visit: Payer: Self-pay | Admitting: Family Medicine

## 2019-10-01 DIAGNOSIS — F320 Major depressive disorder, single episode, mild: Secondary | ICD-10-CM

## 2019-10-02 ENCOUNTER — Other Ambulatory Visit: Payer: Self-pay | Admitting: Family Medicine

## 2019-10-02 ENCOUNTER — Encounter: Payer: Self-pay | Admitting: Family Medicine

## 2019-10-02 DIAGNOSIS — F988 Other specified behavioral and emotional disorders with onset usually occurring in childhood and adolescence: Secondary | ICD-10-CM

## 2019-10-02 MED ORDER — AMPHETAMINE-DEXTROAMPHET ER 20 MG PO CP24: 20.0000 mg | ORAL_CAPSULE | ORAL | 0 refills | Status: DC

## 2019-10-02 NOTE — Telephone Encounter (Signed)
I can not send in Adderall.

## 2019-10-02 NOTE — Telephone Encounter (Signed)
Add prozac 20 mg #30  1 po qd , 2 refills  To the wellbutrin  D/c mydiais ------- we can try adderall xl 20 mg qd #30  ----f/u in 4 weeks and we can adjust dose then

## 2019-10-02 NOTE — Telephone Encounter (Signed)
I know-- I wanted to make sure she was ok with it before I sent it

## 2019-10-03 ENCOUNTER — Other Ambulatory Visit: Payer: Self-pay | Admitting: Family Medicine

## 2019-10-03 DIAGNOSIS — F321 Major depressive disorder, single episode, moderate: Secondary | ICD-10-CM

## 2019-10-03 MED ORDER — FLUOXETINE HCL 20 MG PO TABS: 20.0000 mg | ORAL_TABLET | Freq: Every day | ORAL | 3 refills | Status: DC

## 2019-10-27 ENCOUNTER — Telehealth: Payer: Self-pay | Admitting: Family Medicine

## 2019-10-27 ENCOUNTER — Other Ambulatory Visit: Payer: Self-pay | Admitting: Family Medicine

## 2019-10-27 DIAGNOSIS — F321 Major depressive disorder, single episode, moderate: Secondary | ICD-10-CM

## 2019-10-27 NOTE — Telephone Encounter (Signed)
yes

## 2019-10-27 NOTE — Telephone Encounter (Signed)
Fluoxetine tablets not covered by insurance, okay to send in capsules?

## 2019-10-28 MED ORDER — FLUOXETINE HCL 20 MG PO CAPS: 20.0000 mg | ORAL_CAPSULE | Freq: Every day | ORAL | 1 refills | Status: DC

## 2019-10-28 NOTE — Telephone Encounter (Signed)
Rx changed and sent to pharmacy.  °

## 2019-10-31 ENCOUNTER — Encounter: Payer: Self-pay | Admitting: Family Medicine

## 2019-11-03 NOTE — Telephone Encounter (Signed)
Did we receive the form?

## 2019-11-20 ENCOUNTER — Other Ambulatory Visit: Payer: Self-pay | Admitting: Family Medicine

## 2019-11-20 DIAGNOSIS — F909 Attention-deficit hyperactivity disorder, unspecified type: Secondary | ICD-10-CM

## 2019-11-20 MED ORDER — LISDEXAMFETAMINE DIMESYLATE 40 MG PO CAPS: 40.0000 mg | ORAL_CAPSULE | ORAL | 0 refills | Status: DC

## 2019-11-23 ENCOUNTER — Other Ambulatory Visit: Payer: Self-pay | Admitting: Family Medicine

## 2019-11-23 DIAGNOSIS — F419 Anxiety disorder, unspecified: Secondary | ICD-10-CM

## 2019-11-24 ENCOUNTER — Other Ambulatory Visit: Payer: Self-pay | Admitting: Family Medicine

## 2019-11-24 ENCOUNTER — Other Ambulatory Visit: Payer: Self-pay | Admitting: Internal Medicine

## 2019-11-24 DIAGNOSIS — J302 Other seasonal allergic rhinitis: Secondary | ICD-10-CM

## 2019-11-24 DIAGNOSIS — K2990 Gastroduodenitis, unspecified, without bleeding: Secondary | ICD-10-CM

## 2019-11-24 DIAGNOSIS — K2970 Gastritis, unspecified, without bleeding: Secondary | ICD-10-CM

## 2019-11-24 MED ORDER — DIAZEPAM 5 MG PO TABS: 5.0000 mg | ORAL_TABLET | Freq: Four times a day (QID) | ORAL | 0 refills | Status: DC | PRN

## 2019-11-24 NOTE — Telephone Encounter (Signed)
Requesting: diazepam 5mg  Contract: 09/22/2019 UDS: 09/22/2019 Last Visit: 09/22/2019 Next Visit: 03/26/2020 Last Refill: 02/04/2019 #30 and 0RF  Please Advise

## 2019-11-24 NOTE — Telephone Encounter (Signed)
Okay to send? No longer on med list.

## 2019-12-01 ENCOUNTER — Encounter: Payer: Self-pay | Admitting: Family Medicine

## 2019-12-01 ENCOUNTER — Other Ambulatory Visit: Payer: Self-pay | Admitting: Family Medicine

## 2019-12-01 DIAGNOSIS — F988 Other specified behavioral and emotional disorders with onset usually occurring in childhood and adolescence: Secondary | ICD-10-CM

## 2019-12-01 MED ORDER — LISDEXAMFETAMINE DIMESYLATE 60 MG PO CAPS: 60.0000 mg | ORAL_CAPSULE | ORAL | 0 refills | Status: DC

## 2019-12-01 NOTE — Telephone Encounter (Signed)
Its been  sent

## 2019-12-01 NOTE — Telephone Encounter (Signed)
She has 40 mg on her med list?

## 2019-12-01 NOTE — Telephone Encounter (Signed)
Please  see Mychart message from 06/08/19 Pt requested Vyvanse 60 MG be sent in. You approved it but it looks like 40 MG was sent in by mistake.

## 2019-12-02 ENCOUNTER — Encounter: Payer: Self-pay | Admitting: Family Medicine

## 2019-12-31 ENCOUNTER — Encounter: Payer: Self-pay | Admitting: Family Medicine

## 2019-12-31 ENCOUNTER — Telehealth: Payer: Self-pay

## 2019-12-31 DIAGNOSIS — F988 Other specified behavioral and emotional disorders with onset usually occurring in childhood and adolescence: Secondary | ICD-10-CM

## 2019-12-31 MED ORDER — LISDEXAMFETAMINE DIMESYLATE 60 MG PO CAPS: 60.0000 mg | ORAL_CAPSULE | ORAL | 0 refills | Status: DC

## 2019-12-31 NOTE — Telephone Encounter (Signed)
PA initiated via Covermymeds; KEY: [id number]. Awaiting determination.

## 2019-12-31 NOTE — Telephone Encounter (Signed)
Requesting: Vyvanse  Contract: 10/23/19 UDS: 09/22/19 Last OV: 09/22/2019 Next OV: 03/26/2020 Last Refill: 12/01/2019, #30--0 RF Database:   Please advise

## 2020-01-01 NOTE — Telephone Encounter (Signed)
PA approved.   Approved. This drug has been approved under the Member's Medicare Part D benefit. Approved quantity: 30 <> per 30 day(s). You may fill up to a 90 day supply except for those on Specialty Tier 5, which can be filled up to a 30 day supply. Please call the pharmacy to process the prescription claim.

## 2020-01-30 ENCOUNTER — Other Ambulatory Visit: Payer: Self-pay

## 2020-01-30 DIAGNOSIS — J302 Other seasonal allergic rhinitis: Secondary | ICD-10-CM

## 2020-01-30 DIAGNOSIS — F418 Other specified anxiety disorders: Secondary | ICD-10-CM

## 2020-01-30 DIAGNOSIS — K2970 Gastritis, unspecified, without bleeding: Secondary | ICD-10-CM

## 2020-01-30 MED ORDER — PANTOPRAZOLE SODIUM 40 MG PO TBEC: 40.0000 mg | DELAYED_RELEASE_TABLET | Freq: Every day | ORAL | 0 refills | Status: DC

## 2020-01-30 MED ORDER — BUPROPION HCL ER (XL) 300 MG PO TB24: 300.0000 mg | ORAL_TABLET | Freq: Every day | ORAL | 2 refills | Status: DC

## 2020-01-30 MED ORDER — LEVOCETIRIZINE DIHYDROCHLORIDE 5 MG PO TABS: ORAL_TABLET | ORAL | 1 refills | Status: DC

## 2020-02-03 ENCOUNTER — Other Ambulatory Visit: Payer: Self-pay | Admitting: Family Medicine

## 2020-02-03 ENCOUNTER — Telehealth: Payer: Self-pay | Admitting: *Deleted

## 2020-02-03 DIAGNOSIS — F419 Anxiety disorder, unspecified: Secondary | ICD-10-CM

## 2020-02-03 MED ORDER — DIAZEPAM 5 MG PO TABS: 5.0000 mg | ORAL_TABLET | Freq: Four times a day (QID) | ORAL | 0 refills | Status: DC | PRN

## 2020-02-03 NOTE — Telephone Encounter (Signed)
Pharmacy requesting refill for diazpam  Last written: 11/24/19 Last ov: 09/22/19 Next ov: 03/26/20 Contract: 03/08/18 UDS: 09/22/19

## 2020-02-03 NOTE — Telephone Encounter (Signed)
Done Database reviewed

## 2020-02-04 ENCOUNTER — Telehealth: Payer: Self-pay | Admitting: Family Medicine

## 2020-02-04 ENCOUNTER — Other Ambulatory Visit: Payer: Self-pay | Admitting: Family Medicine

## 2020-02-04 DIAGNOSIS — G5601 Carpal tunnel syndrome, right upper limb: Secondary | ICD-10-CM | POA: Diagnosis not present

## 2020-02-04 DIAGNOSIS — F988 Other specified behavioral and emotional disorders with onset usually occurring in childhood and adolescence: Secondary | ICD-10-CM

## 2020-02-04 NOTE — Telephone Encounter (Signed)
Attempted to call patient. Phone is off and VM not set up. Both Rxs have been refilled recently. Pt needs to call pharmacy.

## 2020-02-04 NOTE — Telephone Encounter (Signed)
Requesting: Vyvanse 60mg  Contract: 09/22/2019 UDS: 09/22/2019 Last Visit: 09/22/2019 Next Visit: 03/26/2020 Last Refill: 12/31/2019 #30 and 0RF  Please Advise

## 2020-02-04 NOTE — Telephone Encounter (Signed)
Medication:lisdexamfetamine (VYVANSE) 60 MG capsule [[id number]]   Has the patient contacted their pharmacy? No. (If no, request that the patient contact the pharmacy for the refill.) (If yes, when and what did the pharmacy advise?)  Preferred Pharmacy (with phone number or street name):  [id number] - [place], [address], [place] [address]  Phone:  [phone] Fax:  [phone]  DEA #:  [id number]  DAW Reason: --     Agent: Please be advised that RX refills may take up to 3 business days. We ask that you follow-up with your pharmacy.

## 2020-02-04 NOTE — Telephone Encounter (Signed)
Medication: diazepam (VALIUM) 5 MG tablet  [[id number]]  buPROPion (WELLBUTRIN XL) 300 MG 24 hr tablet [[id number]      Has the patient contacted their pharmacy? no (If no, request that the patient contact the pharmacy for the refill.) (If yes, when and what did the pharmacy advise?)    Preferred Pharmacy (with phone number or street name):   [id number] - [place], [address] Phone:  [phone]  Fax:  [phone]         Agent: Please be advised that RX refills may take up to 3 business days. We ask that you follow-up with your pharmacy.

## 2020-02-04 NOTE — Telephone Encounter (Signed)
Patient's husband states she is completely out of it

## 2020-02-05 MED ORDER — LISDEXAMFETAMINE DIMESYLATE 60 MG PO CAPS: 60.0000 mg | ORAL_CAPSULE | ORAL | 0 refills | Status: DC

## 2020-02-17 ENCOUNTER — Other Ambulatory Visit: Payer: Self-pay | Admitting: Internal Medicine

## 2020-02-17 DIAGNOSIS — K2970 Gastritis, unspecified, without bleeding: Secondary | ICD-10-CM

## 2020-02-17 DIAGNOSIS — K2990 Gastroduodenitis, unspecified, without bleeding: Secondary | ICD-10-CM

## 2020-03-08 ENCOUNTER — Encounter: Payer: Self-pay | Admitting: Family Medicine

## 2020-03-08 DIAGNOSIS — F988 Other specified behavioral and emotional disorders with onset usually occurring in childhood and adolescence: Secondary | ICD-10-CM

## 2020-03-08 MED ORDER — LISDEXAMFETAMINE DIMESYLATE 60 MG PO CAPS: 60.0000 mg | ORAL_CAPSULE | ORAL | 0 refills | Status: DC

## 2020-03-08 NOTE — Telephone Encounter (Signed)
Requesting: Vyvanse Contract:  09/22/2019 UDS: 09/22/2019 Last OV: 09/22/2019 Next OV: 03/26/2020 Last Refill:  02/05/2020, #30--0 RF Database:   Please advise

## 2020-03-26 ENCOUNTER — Encounter: Payer: Self-pay | Admitting: Family Medicine

## 2020-03-26 ENCOUNTER — Other Ambulatory Visit: Payer: Self-pay

## 2020-03-26 ENCOUNTER — Ambulatory Visit (INDEPENDENT_AMBULATORY_CARE_PROVIDER_SITE_OTHER): Payer: Medicare HMO | Admitting: Family Medicine

## 2020-03-26 VITALS — BP 126/88 | HR 86 | Temp 98.4°F | Resp 18 | Ht 65.0 in | Wt 193.6 lb

## 2020-03-26 DIAGNOSIS — Z79899 Other long term (current) drug therapy: Secondary | ICD-10-CM | POA: Diagnosis not present

## 2020-03-26 DIAGNOSIS — F988 Other specified behavioral and emotional disorders with onset usually occurring in childhood and adolescence: Secondary | ICD-10-CM

## 2020-03-26 DIAGNOSIS — Z136 Encounter for screening for cardiovascular disorders: Secondary | ICD-10-CM

## 2020-03-26 DIAGNOSIS — Z0000 Encounter for general adult medical examination without abnormal findings: Secondary | ICD-10-CM

## 2020-03-26 DIAGNOSIS — F419 Anxiety disorder, unspecified: Secondary | ICD-10-CM

## 2020-03-26 DIAGNOSIS — E2839 Other primary ovarian failure: Secondary | ICD-10-CM | POA: Diagnosis not present

## 2020-03-26 LAB — CBC WITH DIFFERENTIAL/PLATELET
Basophils Absolute: 0 10*3/uL (ref 0.0–0.1)
Basophils Relative: 0.9 % (ref 0.0–3.0)
Eosinophils Absolute: 0.2 10*3/uL (ref 0.0–0.7)
Eosinophils Relative: 4 % (ref 0.0–5.0)
HCT: 39.8 % (ref 36.0–46.0)
Hemoglobin: 13.1 g/dL (ref 12.0–15.0)
Lymphocytes Relative: 40.6 % (ref 12.0–46.0)
Lymphs Abs: 1.7 10*3/uL (ref 0.7–4.0)
MCHC: 32.8 g/dL (ref 30.0–36.0)
MCV: 81.7 fl (ref 78.0–100.0)
Monocytes Absolute: 0.4 10*3/uL (ref 0.1–1.0)
Monocytes Relative: 9.1 % (ref 3.0–12.0)
Neutro Abs: 1.9 10*3/uL (ref 1.4–7.7)
Neutrophils Relative %: 45.4 % (ref 43.0–77.0)
Platelets: 269 10*3/uL (ref 150.0–400.0)
RBC: 4.87 Mil/uL (ref 3.87–5.11)
RDW: 17.2 % — ABNORMAL HIGH (ref 11.5–15.5)
WBC: 4.2 10*3/uL (ref 4.0–10.5)

## 2020-03-26 LAB — LIPID PANEL
Cholesterol: 184 mg/dL (ref 0–200)
HDL: 71.4 mg/dL (ref >39.00)
LDL Cholesterol: 92 mg/dL (ref 0–99)
NonHDL: 112.8
Total CHOL/HDL Ratio: 3
Triglycerides: 106 mg/dL (ref 0.0–149.0)
VLDL: 21.2 mg/dL (ref 0.0–40.0)

## 2020-03-26 LAB — COMPREHENSIVE METABOLIC PANEL
ALT: 18 U/L (ref 0–35)
AST: 16 U/L (ref 0–37)
Albumin: 4.1 g/dL (ref 3.5–5.2)
Alkaline Phosphatase: 58 U/L (ref 39–117)
BUN: 18 mg/dL (ref 6–23)
CO2: 28 mEq/L (ref 19–32)
Calcium: 9.6 mg/dL (ref 8.4–10.5)
Chloride: 104 mEq/L (ref 96–112)
Creatinine, Ser: 0.81 mg/dL (ref 0.40–1.20)
GFR: 75.88 mL/min (ref >60.00)
Glucose, Bld: 88 mg/dL (ref 70–99)
Potassium: 4.3 mEq/L (ref 3.5–5.1)
Sodium: 142 mEq/L (ref 135–145)
Total Bilirubin: 0.3 mg/dL (ref 0.2–1.2)
Total Protein: 6.7 g/dL (ref 6.0–8.3)

## 2020-03-26 MED ORDER — DIAZEPAM 5 MG PO TABS: 5.0000 mg | ORAL_TABLET | Freq: Four times a day (QID) | ORAL | 0 refills | Status: DC | PRN

## 2020-03-26 MED ORDER — HYDROXYZINE HCL 25 MG PO TABS: 25.0000 mg | ORAL_TABLET | Freq: Three times a day (TID) | ORAL | 0 refills | Status: DC | PRN

## 2020-03-26 NOTE — Progress Notes (Addendum)
Subjective:    Theresa [name] is a 65 y.o. female who presents for a Welcome to Medicare exam. She reports that she recently experienced a panic attack and was given Hydroxyzine which has been helpful to tx her symptoms. She denies any SOB, sore throat, fever, chills, N/V/D, hematuria, cough, HA, or vaginal bleeding, discharge, and pain at this time. She is willing to complete the DEXA today. She refuses undergoing a mammogram procedure do to her past experiences with chemo. She is not willing to complete a colonoscopy, pneumonia vaccine, and in office breast exam today. She plans to complete a wellness exam by a Therapist, sports.  Review of Systems  Review of Systems  Constitutional: Negative for activity change, appetite change and fatigue.  HENT: Negative for hearing loss, congestion, tinnitus and ear discharge.   Eyes: Negative for visual disturbance (see optho q1y -- vision corrected to 20/20 with glasses).  Respiratory: Negative for cough, chest tightness and shortness of breath.   Cardiovascular: Negative for chest pain, palpitations and leg swelling.  Gastrointestinal: Negative for abdominal pain, diarrhea, constipation and abdominal distention.  Genitourinary: Negative for urgency, frequency, decreased urine volume and difficulty urinating.  Musculoskeletal: Negative for back pain, arthralgias and gait problem.  Skin: Negative for color change, pallor and rash.  Neurological: Negative for dizziness, light-headedness, numbness and headaches.  Hematological: Negative for adenopathy. Does not bruise/bleed easily.  Psychiatric/Behavioral: Negative for suicidal ideas, confusion, sleep disturbance, self-injury, dysphoric mood, decreased concentration and agitation.  Pt is able to read and write and can do all ADLs No risk for falling No abuse/ violence in home          Objective:    Today's Vitals   03/26/20 0810  BP: 126/88  Pulse: 86  Resp: 18  Temp: 98.4 F (36.9 C)  TempSrc: Oral   SpO2: 99%  Weight: 193 lb 9.6 oz (87.8 kg)  Height: 5\' 5"  (1.651 m)  Body mass index is 32.22 kg/m.  Medications Outpatient Encounter Medications as of 03/26/2020  Medication Sig  . buPROPion (WELLBUTRIN XL) 300 MG 24 hr tablet Take 1 tablet (300 mg total) by mouth daily.  . fluticasone (FLONASE) 50 MCG/ACT nasal spray Place 2 sprays into both nostrils daily as needed for allergies or rhinitis.  . hydrOXYzine (ATARAX/VISTARIL) 25 MG tablet Take 1 tablet (25 mg total) by mouth 3 (three) times daily as needed.  Marland Kitchen levocetirizine (XYZAL) 5 MG tablet TAKE 1 TABLET BY MOUTH EVERY DAY IN THE EVENING  . lisdexamfetamine (VYVANSE) 60 MG capsule Take 1 capsule (60 mg total) by mouth every morning.  . pantoprazole (PROTONIX) 40 MG tablet Take 1 tablet (40 mg total) by mouth daily.  . [DISCONTINUED] ALPRAZolam (XANAX) 0.25 MG tablet Take 1 tablet (0.25 mg total) by mouth 2 (two) times daily as needed for anxiety.  . [DISCONTINUED] diazepam (VALIUM) 5 MG tablet Take 1 tablet (5 mg total) by mouth every 6 (six) hours as needed for muscle spasms.  . diazepam (VALIUM) 5 MG tablet Take 1 tablet (5 mg total) by mouth every 6 (six) hours as needed for muscle spasms.  . [DISCONTINUED] FLUoxetine (PROZAC) 20 MG capsule Take 1 capsule (20 mg total) by mouth daily. (Patient not taking: Reported on 03/26/2020)   No facility-administered encounter medications on file as of 03/26/2020.     History: Past Medical History:  Diagnosis Date  . ADD (attention deficit disorder)   . Anemia   . Arthritis    neck  . Breast cancer (  [place])    Left breast  . Complication of anesthesia    1 time "I felt everything" - approx 2005, - or "maybe 1986 or 2010- not sure"  . Constipation    probotic   . Depression   . Diverticulitis   . Family history of adverse reaction to anesthesia    other  and PG- wake up during surgey  . Fibromyalgia   . GERD (gastroesophageal reflux disease)    occ  . History of blood transfusion    . History of stomach ulcers   . Hypertension    "white coat syndrome"  . Migraines    migraine since age 81   . Pleurisy   . Pneumonia    age 55  . PONV (postoperative nausea and vomiting)   . Urinary incontinence    Past Surgical History:  Procedure Laterality Date  . ANTERIOR CERVICAL DECOMPRESSION/DISCECTOMY FUSION 4 LEVELS N/A 08/01/2018   Procedure: ANTERIOR CERVICAL DECOMPRESSION FUSION, CERVICAL FOUR TO FIVE, CERVICAL FIVE TO SIX , CERVICAL SIX TO SEVEN WITH INSTRUMENTATION AND ALLOGRAFT;  Surgeon: Phylliss Bob, MD;  Location: [place];  Service: Orthopedics;  Laterality: N/A;  . APPENDECTOMY  1960  . BREAST BIOPSY Left 1985  . BREAST RECONSTRUCTION  1986 and 2010   x's 2  . CHOLECYSTECTOMY  1987  . COLONOSCOPY    . LASIK  2012  . MASTECTOMY Left 1985    Family History  Problem Relation Age of Onset  . Breast cancer Mother   . Depression Mother   . Heart failure Father   . Depression Father   . Arthritis Maternal Grandmother    Social History   Occupational History  . Occupation: Dealer: [employer]  Tobacco Use  . Smoking status: Never Smoker  . Smokeless tobacco: Never Used  Vaping Use  . Vaping Use: Never used  Substance and Sexual Activity  . Alcohol use: Yes    Comment: occasionally  . Drug use: No  . Sexual activity: Yes    Tobacco Counseling Counseling given: Not Answered   Immunizations and Health Maintenance Immunization History  Administered Date(s) Administered  . Zoster 09/28/2014   Health Maintenance Due  Topic Date Due  . DEXA SCAN  Never done    Activities of Daily Living In your present state of health, do you have any difficulty performing the following activities: 03/26/2020 09/22/2019  Hearing? N N  Vision? N N  Difficulty concentrating or making decisions? N N  Walking or climbing stairs? N N  Dressing or bathing? N N  Doing errands, shopping? N N  Some recent data might be hidden      BP 126/88 (BP Location: Right Arm, Patient Position: Sitting, Cuff Size: Normal)   Pulse 86   Temp 98.4 F (36.9 C) (Oral)   Resp 18   Ht 5\' 5"  (1.651 m)   Wt 193 lb 9.6 oz (87.8 kg)   SpO2 99%   BMI 32.22 kg/m  General appearance: alert, cooperative, appears stated age and no distress Head: Normocephalic, without obvious abnormality, atraumatic Eyes: conjunctivae/corneas clear. PERRL, EOM's intact. Fundi benign. Throat: lips, mucosa, and tongue normal; teeth and gums normal Neck: no adenopathy, no carotid bruit, no JVD, supple, symmetrical, trachea midline and thyroid not enlarged, symmetric, no tenderness/mass/nodules Back: symmetric, no curvature. ROM normal. No CVA tenderness. Lungs: clear to auscultation bilaterally and normal percussion bilaterally Breasts: patient refuses exam at this time Heart: regular rate and rhythm, S1, S2  normal, no murmur, click, rub or gallop Abdomen: soft, non-tender; bowel sounds normal; no masses,  no organomegaly Pelvic: cervix normal in appearance, external genitalia normal, no adnexal masses or tenderness, no cervical motion tenderness, rectovaginal septum normal, uterus normal size, shape, and consistency and vagina normal without discharge Extremities: extremities normal, atraumatic, no cyanosis or edema Pulses: 2+ and symmetric Skin: Skin color, texture, turgor normal. No rashes or lesions Lymph nodes: Cervical, supraclavicular, and axillary nodes normal. Neurologic: Alert and oriented X 3, normal strength and tone. Normal symmetric reflexes. Normal coordination and gait   Advanced Directives: Does Patient Have a Medical Advance Directive?: No Would patient like information on creating a medical advance directive?: Yes (MAU/Ambulatory/Procedural Areas - Information given)    Assessment:    This is a routine wellness examination for this patient .   Vision/Hearing screen  Hearing Screening   125Hz  250Hz  500Hz  1000Hz  2000Hz  3000Hz  4000Hz   6000Hz  8000Hz   Right ear:           Left ear:           Comments: Normal whisper    Dietary issues and exercise activities discussed:  Current Exercise Habits: Home exercise routine, Intensity: Mild, Exercise limited by: None identified  Goals   None    Depression Screen PHQ 2/9 Scores 03/26/2020 06/11/2018 08/30/2017 01/13/2016  PHQ - 2 Score 2 2 2  0  PHQ- 9 Score - 7 - -     Fall Risk Fall Risk  03/26/2020  Falls in the past year? 0  Number falls in past yr: 0  Injury with Fall? 0  Risk for fall due to : No Fall Risks    Cognitive Function: MMSE - Mini Mental State Exam 03/26/2020  Orientation to time 5  Orientation to Place 5  Registration 3  Attention/ Calculation 5  Recall 3  Language- name 2 objects 2  Language- repeat 1  Language- follow 3 step command 3  Language- read & follow direction 1  Write a sentence 1  Copy design 1  Total score 30   Health Maintenance  Topic Date Due  . DEXA SCAN  Never done  . COVID-19 Vaccine (1) 04/11/2020 (Originally 04/05/1967)  . INFLUENZA VACCINE  05/12/2020 (Originally 08/10/2019)  . MAMMOGRAM  03/26/2021 (Originally 06/04/2015)  . COLONOSCOPY (Pts 45-10yrs Insurance coverage will need to be confirmed)  03/26/2021 (Originally 01/10/2020)  . PNA vac Low Risk Adult (1 of 2 - PCV13) 03/26/2021 (Originally 04/05/2019)  . TETANUS/TDAP  06/04/2023  . Hepatitis C Screening  Completed  . HPV VACCINES  Aged Out         Patient Care Team: Carollee Herter, Alferd Apa, DO as PCP - General (Family Medicine)     Plan:   ghm utd  Check labs   I have personally reviewed and noted the following in the patient's chart:   . Medical and social history . Use of alcohol, tobacco or illicit drugs  . Current medications and supplements . Functional ability and status . Nutritional status . Physical activity . Advanced directives . List of other physicians . Hospitalizations, surgeries, and ER visits in previous 12 months . Vitals . Screenings  to include cognitive, depression, and falls . Referrals and appointments  In addition, I have reviewed and discussed with patient certain preventive protocols, quality metrics, and best practice recommendations. A written personalized care plan for preventive services as well as general preventive health recommendations were provided to patient. .1. Anxiety Stable -- she uses the valium  only at night  - diazepam (VALIUM) 5 MG tablet; Take 1 tablet (5 mg total) by mouth every 6 (six) hours as needed for muscle spasms.  Dispense: 30 tablet; Refill: 0 - hydrOXYzine (ATARAX/VISTARIL) 25 MG tablet; Take 1 tablet (25 mg total) by mouth 3 (three) times daily as needed.  Dispense: 30 tablet; Refill: 0  2. Attention deficit disorder (ADD) without hyperactivity On vyvanse Stable Contract and uds updated  - DRUG MONITORING, PANEL 8 WITH CONFIRMATION, URINE  3. High risk medication use   - DRUG MONITORING, PANEL 8 WITH CONFIRMATION, URINE  4. Estrogen deficiency   - DG Bone Density; Future  5. Ischemic heart disease screen   - Lipid panel - CBC with Differential/Platelet - Comprehensive metabolic panel  6. Encounter for Medicare annual wellness exam   7. Welcome to Medicare preventive visit  - EKG 12-Lead----  ekg  --NSR      Compared to 3/ 2021---  Same  I Jaquita Rector as a scribe for Ann Held, DO.,have documented all relevant documentation on the behalf of Ann Held, DO,as directed by  Ann Held, DO while in the presence of [place], DO, have reviewed all documentation for this visit. The documentation on 03/26/20 for the exam, diagnosis, procedures, and orders are all accurate and complete.  [place], DO 03/26/2020

## 2020-03-26 NOTE — Patient Instructions (Signed)
Preventive Care 84-66 Years Old, Female Preventive care refers to lifestyle choices and visits with your health care provider that can promote health and wellness. This includes:  A yearly physical exam. This is also called an annual wellness visit.  Regular dental and eye exams.  Immunizations.  Screening for certain conditions.  Healthy lifestyle choices, such as: ? Eating a healthy diet. ? Getting regular exercise. ? Not using drugs or products that contain nicotine and tobacco. ? Limiting alcohol use. What can I expect for my preventive care visit? Physical exam Your health care provider will check your:  Height and weight. These may be used to calculate your BMI (body mass index). BMI is a measurement that tells if you are at a healthy weight.  Heart rate and blood pressure.  Body temperature.  Skin for abnormal spots. Counseling Your health care provider may ask you questions about your:  Past medical problems.  Family's medical history.  Alcohol, tobacco, and drug use.  Emotional well-being.  Home life and relationship well-being.  Sexual activity.  Diet, exercise, and sleep habits.  Work and work Statistician.  Access to firearms.  Method of birth control.  Menstrual cycle.  Pregnancy history. What immunizations do I need? Vaccines are usually given at various ages, according to a schedule. Your health care provider will recommend vaccines for you based on your age, medical history, and lifestyle or other factors, such as travel or where you work.   What tests do I need? Blood tests  Lipid and cholesterol levels. These may be checked every 5 years, or more often if you are over 66 years old.  Hepatitis C test.  Hepatitis B test. Screening  Lung cancer screening. You may have this screening every year starting at age 66 if you have a 30-pack-year history of smoking and currently smoke or have quit within the past 15 years.  Colorectal cancer  screening. ? All adults should have this screening starting at age 66 and continuing until age 17. ? Your health care provider may recommend screening at age 66 if you are at increased risk. ? You will have tests every 1-10 years, depending on your results and the type of screening test.  Diabetes screening. ? This is done by checking your blood sugar (glucose) after you have not eaten for a while (fasting). ? You may have this done every 1-3 years.  Mammogram. ? This may be done every 1-2 years. ? Talk with your health care provider about when you should start having regular mammograms. This may depend on whether you have a family history of breast cancer.  BRCA-related cancer screening. This may be done if you have a family history of breast, ovarian, tubal, or peritoneal cancers.  Pelvic exam and Pap test. ? This may be done every 3 years starting at age 66. ? Starting at age 66, this may be done every 5 years if you have a Pap test in combination with an HPV test. Other tests  STD (sexually transmitted disease) testing, if you are at risk.  Bone density scan. This is done to screen for osteoporosis. You may have this scan if you are at high risk for osteoporosis. Talk with your health care provider about your test results, treatment options, and if necessary, the need for more tests. Follow these instructions at home: Eating and drinking  Eat a diet that includes fresh fruits and vegetables, whole grains, lean protein, and low-fat dairy products.  Take vitamin and mineral supplements  as recommended by your health care provider.  Do not drink alcohol if: ? Your health care provider tells you not to drink. ? You are pregnant, may be pregnant, or are planning to become pregnant.  If you drink alcohol: ? Limit how much you have to 0-1 drink a day. ? Be aware of how much alcohol is in your drink. In the [place] one drink equals one 12 oz bottle of beer (355 mL), one 5 oz glass of  wine (148 mL), or one 1 oz glass of hard liquor (44 mL).   Lifestyle  Take daily care of your teeth and gums. Brush your teeth every morning and night with fluoride toothpaste. Floss one time each day.  Stay active. Exercise for at least 30 minutes 5 or more days each week.  Do not use any products that contain nicotine or tobacco, such as cigarettes, e-cigarettes, and chewing tobacco. If you need help quitting, ask your health care provider.  Do not use drugs.  If you are sexually active, practice safe sex. Use a condom or other form of protection to prevent STIs (sexually transmitted infections).  If you do not wish to become pregnant, use a form of birth control. If you plan to become pregnant, see your health care provider for a prepregnancy visit.  If told by your health care provider, take low-dose aspirin daily starting at age 66.  Find healthy ways to cope with stress, such as: ? Meditation, yoga, or listening to music. ? Journaling. ? Talking to a trusted person. ? Spending time with friends and family. Safety  Always wear your seat belt while driving or riding in a vehicle.  Do not drive: ? If you have been drinking alcohol. Do not ride with someone who has been drinking. ? When you are tired or distracted. ? While texting.  Wear a helmet and other protective equipment during sports activities.  If you have firearms in your house, make sure you follow all gun safety procedures. What's next?  Visit your health care provider once a year for an annual wellness visit.  Ask your health care provider how often you should have your eyes and teeth checked.  Stay up to date on all vaccines. This information is not intended to replace advice given to you by your health care provider. Make sure you discuss any questions you have with your health care provider. Document Revised: 09/30/2019 Document Reviewed: 09/06/2017 Elsevier Patient Education  2021 Elsevier Inc.  

## 2020-03-28 LAB — DRUG MONITORING, PANEL 8 WITH CONFIRMATION, URINE
6 Acetylmorphine: NEGATIVE ng/mL (ref <10)
Alcohol Metabolites: NEGATIVE ng/mL
Alphahydroxyalprazolam: NEGATIVE ng/mL (ref <25)
Alphahydroxymidazolam: NEGATIVE ng/mL (ref <50)
Alphahydroxytriazolam: NEGATIVE ng/mL (ref <50)
Aminoclonazepam: NEGATIVE ng/mL (ref <25)
Amphetamine: 7904 ng/mL — ABNORMAL HIGH (ref <250)
Amphetamines: POSITIVE ng/mL — AB (ref <500)
Benzodiazepines: POSITIVE ng/mL — AB (ref <100)
Buprenorphine, Urine: NEGATIVE ng/mL (ref <5)
Cocaine Metabolite: NEGATIVE ng/mL (ref <150)
Creatinine: 79.5 mg/dL
Hydroxyethylflurazepam: NEGATIVE ng/mL (ref <50)
Lorazepam: NEGATIVE ng/mL (ref <50)
MDMA: NEGATIVE ng/mL (ref <500)
Marijuana Metabolite: NEGATIVE ng/mL (ref <20)
Methamphetamine: NEGATIVE ng/mL (ref <250)
Nordiazepam: NEGATIVE ng/mL (ref <50)
Opiates: NEGATIVE ng/mL (ref <100)
Oxazepam: 59 ng/mL — ABNORMAL HIGH (ref <50)
Oxidant: NEGATIVE ug/mL
Oxycodone: NEGATIVE ng/mL (ref <100)
Temazepam: 53 ng/mL — ABNORMAL HIGH (ref <50)
pH: 6.1 (ref 4.5–9.0)

## 2020-03-28 LAB — DM TEMPLATE

## 2020-04-01 ENCOUNTER — Other Ambulatory Visit (HOSPITAL_BASED_OUTPATIENT_CLINIC_OR_DEPARTMENT_OTHER): Payer: Self-pay | Admitting: Family Medicine

## 2020-04-01 DIAGNOSIS — Z1231 Encounter for screening mammogram for malignant neoplasm of breast: Secondary | ICD-10-CM

## 2020-04-05 ENCOUNTER — Other Ambulatory Visit: Payer: Self-pay | Admitting: Family Medicine

## 2020-04-05 DIAGNOSIS — K2970 Gastritis, unspecified, without bleeding: Secondary | ICD-10-CM

## 2020-04-06 ENCOUNTER — Telehealth: Payer: Self-pay | Admitting: Family Medicine

## 2020-04-06 ENCOUNTER — Encounter: Payer: Self-pay | Admitting: Family Medicine

## 2020-04-06 DIAGNOSIS — F988 Other specified behavioral and emotional disorders with onset usually occurring in childhood and adolescence: Secondary | ICD-10-CM

## 2020-04-06 NOTE — Telephone Encounter (Signed)
Caller: Yanci  Call Back @ [phone]  Patient states she will have surgery in a few months and will need pre op labs drawn for Dr Tommie Raymond the orthopedic. Patient states Dr Baxter Hire office we also send medical clearance forms to Dr Etter Sjogren as well.  Please advise if patient can get lab draw at our office?

## 2020-04-07 ENCOUNTER — Encounter: Payer: Self-pay | Admitting: Family Medicine

## 2020-04-07 MED ORDER — LISDEXAMFETAMINE DIMESYLATE 60 MG PO CAPS: 60.0000 mg | ORAL_CAPSULE | ORAL | 0 refills | Status: DC

## 2020-04-07 NOTE — Telephone Encounter (Signed)
I will send in but remind her she needs ov q6 months for add meds

## 2020-04-07 NOTE — Telephone Encounter (Signed)
Requesting: Vyvanse Contract: 03/26/2020 UDS: 03/26/2020 Last OV: 03/26/2020 Next OV: 03/30/2019 Last Refill: 03/08/2020, #30--0 RF Database:   Please advise

## 2020-04-08 ENCOUNTER — Other Ambulatory Visit: Payer: Self-pay

## 2020-04-08 DIAGNOSIS — F419 Anxiety disorder, unspecified: Secondary | ICD-10-CM

## 2020-04-08 MED ORDER — HYDROXYZINE HCL 25 MG PO TABS: 25.0000 mg | ORAL_TABLET | Freq: Three times a day (TID) | ORAL | 1 refills | Status: DC | PRN

## 2020-04-08 NOTE — Telephone Encounter (Signed)
Pt called. LDVM 

## 2020-04-12 ENCOUNTER — Telehealth: Payer: Self-pay | Admitting: *Deleted

## 2020-04-12 NOTE — Telephone Encounter (Signed)
Medication should be hydroxyzine.  Rx sent in on 04/08/20.  Spoke with Fredonia Highland at [facility] and he stated that rx was shipped out on 04/09/20.  Left message on pt machine as well.

## 2020-04-12 NOTE — Telephone Encounter (Signed)
Who Is Calling Pharmacy Call Type Pharmacy Send to RN Chief Complaint Paging or Request for Consult Reason for Call Request to clarify medication order Initial Comment Caller states they needed prescription refill sent to [facility] Additional Comment Caller states the medication is hydralazine 25 mg tablet Pharmacy Name [facility] Pharmacist Name Onset Number [phone] Translation No Disp. Time Eilene Ghazi Time) Disposition Final User 04/09/2020 7:56:57 PM Pharmacy Call Nyoka Cowden, RN, [facility] Nutting Reason: pharmacy will call provider on monday regarding refill

## 2020-04-16 ENCOUNTER — Other Ambulatory Visit: Payer: Self-pay

## 2020-04-16 ENCOUNTER — Encounter: Payer: Self-pay | Admitting: Family Medicine

## 2020-04-16 ENCOUNTER — Ambulatory Visit (INDEPENDENT_AMBULATORY_CARE_PROVIDER_SITE_OTHER): Payer: Medicare HMO | Admitting: Family Medicine

## 2020-04-16 VITALS — BP 150/92 | HR 87 | Temp 98.7°F | Resp 18 | Ht 65.0 in | Wt 194.2 lb

## 2020-04-16 DIAGNOSIS — Z01818 Encounter for other preprocedural examination: Secondary | ICD-10-CM

## 2020-04-16 NOTE — Patient Instructions (Addendum)
Presurgical clearance for R knee replacement with Dr Berenice Primas Will fill out form and fax it back once received

## 2020-04-16 NOTE — Progress Notes (Signed)
Patient ID: Theresa [name], female    DOB: 1955-04-13  Age: 65 y.o. MRN: [id number]    Subjective:  Subjective  HPI Theresa [name] presents for an office visit today. She complains of right knee pain. She notes that it worsens with walking. She reports she had scheduled surgical operations in May for a torn meniscus as well as a right knee replacement. She denies any chest pain, SOB, fever, abdominal pain, cough, chills, sore throat, dysuria, urinary incontinence, back pain, HA, or N/V/D at this time.   Review of Systems  Constitutional: Negative for chills, fatigue and fever.  HENT: Negative for ear pain, sinus pressure, sinus pain and sore throat.   Eyes: Negative for pain.  Respiratory: Negative for cough and shortness of breath.   Cardiovascular: Negative for chest pain, palpitations and leg swelling.  Gastrointestinal: Negative for abdominal pain, blood in stool, constipation, diarrhea, nausea and vomiting.  Genitourinary: Negative for dysuria, frequency, hematuria and urgency.  Musculoskeletal: Negative for back pain.       (+) R knee pain    Neurological: Negative for dizziness and headaches.    History Past Medical History:  Diagnosis Date  . ADD (attention deficit disorder)   . Anemia   . Arthritis    neck  . Breast cancer ([place])    Left breast  . Complication of anesthesia    1 time "I felt everything" - approx 2005, - or "maybe 1986 or 2010- not sure"  . Constipation    probotic   . Depression   . Diverticulitis   . Family history of adverse reaction to anesthesia    other  and PG- wake up during surgey  . Fibromyalgia   . GERD (gastroesophageal reflux disease)    occ  . History of blood transfusion   . History of stomach ulcers   . Hypertension    "white coat syndrome"  . Migraines    migraine since age 20   . Pleurisy   . Pneumonia    age 61  . PONV (postoperative nausea and vomiting)   . Urinary incontinence     She has a past surgical history that  includes Cholecystectomy (1987); Appendectomy (1960); Breast biopsy (Left, 1985); Mastectomy (Left, 1985); Breast reconstruction (1986 and 2010); LASIK (2012); Colonoscopy; and Anterior cervical decompression/discectomy fusion 4 level (N/A, 08/01/2018).   Her family history includes Arthritis in her maternal grandmother; Breast cancer in her mother; Depression in her father and mother; Heart failure in her father.She reports that she has never smoked. She has never used smokeless tobacco. She reports current alcohol use. She reports that she does not use drugs.  Current Outpatient Medications on File Prior to Visit  Medication Sig Dispense Refill  . buPROPion (WELLBUTRIN XL) 300 MG 24 hr tablet Take 1 tablet (300 mg total) by mouth daily. 90 tablet 2  . diazepam (VALIUM) 5 MG tablet Take 1 tablet (5 mg total) by mouth every 6 (six) hours as needed for muscle spasms. 30 tablet 0  . fluticasone (FLONASE) 50 MCG/ACT nasal spray Place 2 sprays into both nostrils daily as needed for allergies or rhinitis. 48 mL 1  . hydrOXYzine (ATARAX/VISTARIL) 25 MG tablet Take 1 tablet (25 mg total) by mouth 3 (three) times daily as needed. 90 tablet 1  . levocetirizine (XYZAL) 5 MG tablet TAKE 1 TABLET BY MOUTH EVERY DAY IN THE EVENING 90 tablet 1  . lisdexamfetamine (VYVANSE) 60 MG capsule Take 1 capsule (60 mg total) by mouth  every morning. 30 capsule 0  . pantoprazole (PROTONIX) 40 MG tablet TAKE 1 TABLET EVERY DAY 90 tablet 1   No current facility-administered medications on file prior to visit.     Objective:  Objective  Physical Exam Vitals and nursing note reviewed.  Constitutional:      General: She is not in acute distress.    Appearance: Normal appearance. She is well-developed. She is not ill-appearing.  HENT:     Head: Normocephalic and atraumatic.     Right Ear: External ear normal.     Left Ear: External ear normal.     Nose: Nose normal.  Eyes:     Extraocular Movements: Extraocular  movements intact.     Pupils: Pupils are equal, round, and reactive to light.  Cardiovascular:     Rate and Rhythm: Normal rate and regular rhythm.     Pulses: Normal pulses.     Heart sounds: Normal heart sounds. No murmur heard. No friction rub. No gallop.   Pulmonary:     Effort: Pulmonary effort is normal. No respiratory distress.     Breath sounds: Normal breath sounds. No stridor. No wheezing, rhonchi or rales.  Abdominal:     General: Bowel sounds are normal. There is no distension.     Palpations: Abdomen is soft.     Tenderness: There is no abdominal tenderness. There is no guarding.     Hernia: No hernia is present.  Musculoskeletal:        General: Normal range of motion.     Cervical back: Normal range of motion and neck supple.  Skin:    General: Skin is warm and dry.  Neurological:     Mental Status: She is alert and oriented to person, place, and time.  Psychiatric:        Behavior: Behavior normal.        Thought Content: Thought content normal.    BP (!) 150/92 (BP Location: Left Arm, Patient Position: Sitting, Cuff Size: Normal)   Pulse 87   Temp 98.7 F (37.1 C) (Oral)   Resp 18   Ht 5\' 5"  (1.651 m)   Wt 194 lb 3.2 oz (88.1 kg)   SpO2 96%   BMI 32.32 kg/m  Wt Readings from Last 3 Encounters:  04/16/20 194 lb 3.2 oz (88.1 kg)  03/26/20 193 lb 9.6 oz (87.8 kg)  09/22/19 197 lb (89.4 kg)     Lab Results  Component Value Date   WBC 4.2 03/26/2020   HGB 13.1 03/26/2020   HCT 39.8 03/26/2020   PLT 269.0 03/26/2020   GLUCOSE 88 03/26/2020   CHOL 184 03/26/2020   TRIG 106.0 03/26/2020   HDL 71.40 03/26/2020   LDLCALC 92 03/26/2020   ALT 18 03/26/2020   AST 16 03/26/2020   NA 142 03/26/2020   K 4.3 03/26/2020   CL 104 03/26/2020   CREATININE 0.81 03/26/2020   BUN 18 03/26/2020   CO2 28 03/26/2020   TSH 1.50 09/28/2014   INR 0.9 07/29/2018    CT CERVICAL SPINE WO CONTRAST  Result Date: 04/28/2019 CLINICAL DATA:  Neck pain post ACDF  08/01/2018. Evaluate pseudoarthrosis. EXAM: CT CERVICAL SPINE WITHOUT CONTRAST TECHNIQUE: Multidetector CT imaging of the cervical spine was performed without intravenous contrast. Multiplanar CT image reconstructions were also generated. COMPARISON:  Intraoperative radiographs 08/01/2018 and MRI 03/29/2018. FINDINGS: Alignment: Straightening with a slight anterolisthesis at C3-4. Skull base and vertebrae: Interval anterior discectomy and fusion from C4 through C7 using an  anterior plate, screws and interbody spacers. There is evidence for solid interbody fusion at each level, although evaluation is mildly limited by artifact. There is no hardware loosening or displacement. There is ankylosis of the right C4-5 facet joint. No evidence of acute fracture. Probable developmental incomplete segmentation at C2-3. Soft tissues and spinal canal: No prevertebral fluid or swelling. No visible canal hematoma. Disc levels: C2-3: Probable congenital incomplete segmentation. No significant spinal stenosis or nerve root encroachment. C3-4: Loss of disc height with asymmetric uncinate spurring and facet hypertrophy on the right. Moderate right foraminal narrowing, similar to previous MRI. The left foramen is patent. Mild residual foraminal narrowing at the C4-5, C5-6 and C6-7 levels post ACDF. C7-T1: Mild bilateral facet hypertrophy. Stable left greater than right foraminal narrowing due to osteophytes. Upper chest: Unremarkable. Other: None. IMPRESSION: 1. Interval anterior discectomy and fusion from C4 through C7. There is evidence for solid interbody fusion at each level, and the right C4-5 facet joint appears ankylosed. No evidence of hardware displacement or pseudoarthrosis. 2. Stable moderate right foraminal narrowing at C3-4 and moderate left foraminal narrowing at C7-T1. 3. Mild residual foraminal narrowing at the C4-5, C5-6 and C6-7 levels. Electronically Signed   By: Richardean Sale M.D.   On: 04/28/2019 11:31      Assessment & Plan:  Plan    No orders of the defined types were placed in this encounter.   Problem List Items Addressed This Visit   None   Visit Diagnoses    Pre-op examination    -  Primary   Relevant Orders   Protime-INR ( [facility] ONLY)   PTT    labs done earlier this month ekg reviewed from earlier this month Pt cleared for surgery  Follow-up: Return if symptoms worsen or fail to improve.   I,Theresa [name],acting as a Education administrator for [employer], [name].,have documented all relevant documentation on the behalf of Theresa Held, [name],as directed by  Theresa Held, [name] while in the presence of [place], [name], have reviewed all documentation for this visit. The documentation on 04/16/20 for the exam, diagnosis, procedures, and orders are all accurate and complete.

## 2020-05-07 ENCOUNTER — Encounter: Payer: Self-pay | Admitting: Family Medicine

## 2020-05-10 ENCOUNTER — Other Ambulatory Visit: Payer: Self-pay

## 2020-05-10 DIAGNOSIS — F988 Other specified behavioral and emotional disorders with onset usually occurring in childhood and adolescence: Secondary | ICD-10-CM

## 2020-05-10 MED ORDER — LISDEXAMFETAMINE DIMESYLATE 60 MG PO CAPS: 60.0000 mg | ORAL_CAPSULE | ORAL | 0 refills | Status: DC

## 2020-05-10 NOTE — Telephone Encounter (Signed)
Requesting: Vyvanse 60 mg  Contract: 03/26/2020 UDS: 03/26/2020 Last Visit: 04/16/2020 Next Visit: 03/29/21 Last Refill: 04/07/2020

## 2020-05-13 ENCOUNTER — Telehealth: Payer: Self-pay

## 2020-05-13 NOTE — Telephone Encounter (Signed)
Please disregard previous message. Spoke with patient. Pt states having a card from [place] to get medication for free. I advised patient to call the pharmacy to inform them of this card.

## 2020-05-13 NOTE — Telephone Encounter (Signed)
Received alternative medication request from [facility] regarding cost of Vyvanse. Medication is no longer on insurance formulary. There was a PA approval in December 2021.  I called patient and left a VM to discuss options.  Suggested Alternatives:  Dextroamp-amphetam 7.5 tablet  Atomoxetine HCL 10 MG capsule  Methylphenidate 10 MG tablet  Please advise

## 2020-05-18 DIAGNOSIS — M1711 Unilateral primary osteoarthritis, right knee: Secondary | ICD-10-CM | POA: Diagnosis not present

## 2020-05-18 DIAGNOSIS — M25531 Pain in right wrist: Secondary | ICD-10-CM | POA: Diagnosis not present

## 2020-05-18 DIAGNOSIS — M25561 Pain in right knee: Secondary | ICD-10-CM | POA: Diagnosis not present

## 2020-05-24 DIAGNOSIS — M1711 Unilateral primary osteoarthritis, right knee: Secondary | ICD-10-CM | POA: Diagnosis not present

## 2020-05-26 DIAGNOSIS — G8918 Other acute postprocedural pain: Secondary | ICD-10-CM | POA: Diagnosis not present

## 2020-05-26 DIAGNOSIS — M1711 Unilateral primary osteoarthritis, right knee: Secondary | ICD-10-CM | POA: Diagnosis not present

## 2020-05-26 DIAGNOSIS — Z96651 Presence of right artificial knee joint: Secondary | ICD-10-CM | POA: Diagnosis not present

## 2020-05-27 DIAGNOSIS — Z96651 Presence of right artificial knee joint: Secondary | ICD-10-CM | POA: Diagnosis not present

## 2020-05-27 DIAGNOSIS — M797 Fibromyalgia: Secondary | ICD-10-CM | POA: Diagnosis not present

## 2020-05-27 DIAGNOSIS — Z471 Aftercare following joint replacement surgery: Secondary | ICD-10-CM | POA: Diagnosis not present

## 2020-05-27 DIAGNOSIS — Z853 Personal history of malignant neoplasm of breast: Secondary | ICD-10-CM | POA: Diagnosis not present

## 2020-05-29 DIAGNOSIS — Z471 Aftercare following joint replacement surgery: Secondary | ICD-10-CM | POA: Diagnosis not present

## 2020-05-29 DIAGNOSIS — Z853 Personal history of malignant neoplasm of breast: Secondary | ICD-10-CM | POA: Diagnosis not present

## 2020-05-29 DIAGNOSIS — M797 Fibromyalgia: Secondary | ICD-10-CM | POA: Diagnosis not present

## 2020-05-29 DIAGNOSIS — Z96651 Presence of right artificial knee joint: Secondary | ICD-10-CM | POA: Diagnosis not present

## 2020-05-31 DIAGNOSIS — Z853 Personal history of malignant neoplasm of breast: Secondary | ICD-10-CM | POA: Diagnosis not present

## 2020-05-31 DIAGNOSIS — Z96651 Presence of right artificial knee joint: Secondary | ICD-10-CM | POA: Diagnosis not present

## 2020-05-31 DIAGNOSIS — M797 Fibromyalgia: Secondary | ICD-10-CM | POA: Diagnosis not present

## 2020-05-31 DIAGNOSIS — Z471 Aftercare following joint replacement surgery: Secondary | ICD-10-CM | POA: Diagnosis not present

## 2020-06-02 DIAGNOSIS — Z853 Personal history of malignant neoplasm of breast: Secondary | ICD-10-CM | POA: Diagnosis not present

## 2020-06-02 DIAGNOSIS — M797 Fibromyalgia: Secondary | ICD-10-CM | POA: Diagnosis not present

## 2020-06-02 DIAGNOSIS — Z471 Aftercare following joint replacement surgery: Secondary | ICD-10-CM | POA: Diagnosis not present

## 2020-06-02 DIAGNOSIS — Z96651 Presence of right artificial knee joint: Secondary | ICD-10-CM | POA: Diagnosis not present

## 2020-06-04 DIAGNOSIS — Z96651 Presence of right artificial knee joint: Secondary | ICD-10-CM | POA: Diagnosis not present

## 2020-06-04 DIAGNOSIS — M797 Fibromyalgia: Secondary | ICD-10-CM | POA: Diagnosis not present

## 2020-06-04 DIAGNOSIS — Z853 Personal history of malignant neoplasm of breast: Secondary | ICD-10-CM | POA: Diagnosis not present

## 2020-06-04 DIAGNOSIS — Z471 Aftercare following joint replacement surgery: Secondary | ICD-10-CM | POA: Diagnosis not present

## 2020-06-08 DIAGNOSIS — M6281 Muscle weakness (generalized): Secondary | ICD-10-CM | POA: Diagnosis not present

## 2020-06-08 DIAGNOSIS — Z471 Aftercare following joint replacement surgery: Secondary | ICD-10-CM | POA: Diagnosis not present

## 2020-06-08 DIAGNOSIS — M25661 Stiffness of right knee, not elsewhere classified: Secondary | ICD-10-CM | POA: Diagnosis not present

## 2020-06-08 DIAGNOSIS — Z96651 Presence of right artificial knee joint: Secondary | ICD-10-CM | POA: Diagnosis not present

## 2020-06-09 DIAGNOSIS — H5203 Hypermetropia, bilateral: Secondary | ICD-10-CM | POA: Diagnosis not present

## 2020-06-09 DIAGNOSIS — H524 Presbyopia: Secondary | ICD-10-CM | POA: Diagnosis not present

## 2020-06-16 ENCOUNTER — Encounter: Payer: Self-pay | Admitting: Family Medicine

## 2020-06-16 DIAGNOSIS — F988 Other specified behavioral and emotional disorders with onset usually occurring in childhood and adolescence: Secondary | ICD-10-CM

## 2020-06-16 NOTE — Telephone Encounter (Signed)
Requesting: Vyvanse Contract: 03/26/20 UDS: 03/26/20 Last OV: 04/16/20 Next OV: 03/29/21 Last Refill: 05/10/20, #30--0 RF Database:   Please advise

## 2020-06-17 MED ORDER — LISDEXAMFETAMINE DIMESYLATE 60 MG PO CAPS: 60.0000 mg | ORAL_CAPSULE | ORAL | 0 refills | Status: DC

## 2020-06-18 ENCOUNTER — Telehealth: Payer: Self-pay

## 2020-06-18 NOTE — Telephone Encounter (Signed)
Called to ask the pt- mailbox was full could not leave a vm

## 2020-06-18 NOTE — Telephone Encounter (Signed)
Fax received asking for an alternative to be sent for the Vyvanse d/t it needing a PA.   Alternatives:  Dextroamp-amphetamine- 7.5 mg tab Atomoxetine HCL 10 mg cap Methylphenidate 10 mg tab

## 2020-06-24 DIAGNOSIS — M25661 Stiffness of right knee, not elsewhere classified: Secondary | ICD-10-CM | POA: Diagnosis not present

## 2020-06-24 DIAGNOSIS — M6281 Muscle weakness (generalized): Secondary | ICD-10-CM | POA: Diagnosis not present

## 2020-06-24 DIAGNOSIS — Z96651 Presence of right artificial knee joint: Secondary | ICD-10-CM | POA: Diagnosis not present

## 2020-06-30 DIAGNOSIS — S39012A Strain of muscle, fascia and tendon of lower back, initial encounter: Secondary | ICD-10-CM | POA: Diagnosis not present

## 2020-06-30 DIAGNOSIS — M7061 Trochanteric bursitis, right hip: Secondary | ICD-10-CM | POA: Diagnosis not present

## 2020-06-30 DIAGNOSIS — M25551 Pain in right hip: Secondary | ICD-10-CM | POA: Diagnosis not present

## 2020-07-05 ENCOUNTER — Encounter: Payer: Self-pay | Admitting: Family Medicine

## 2020-07-05 ENCOUNTER — Other Ambulatory Visit: Payer: Self-pay | Admitting: Family Medicine

## 2020-07-05 DIAGNOSIS — K2970 Gastritis, unspecified, without bleeding: Secondary | ICD-10-CM

## 2020-07-05 DIAGNOSIS — K2990 Gastroduodenitis, unspecified, without bleeding: Secondary | ICD-10-CM

## 2020-07-05 DIAGNOSIS — F418 Other specified anxiety disorders: Secondary | ICD-10-CM

## 2020-07-05 DIAGNOSIS — F419 Anxiety disorder, unspecified: Secondary | ICD-10-CM

## 2020-07-05 DIAGNOSIS — J302 Other seasonal allergic rhinitis: Secondary | ICD-10-CM

## 2020-07-05 MED ORDER — PANTOPRAZOLE SODIUM 40 MG PO TBEC: 40.0000 mg | DELAYED_RELEASE_TABLET | Freq: Every day | ORAL | 1 refills | Status: DC

## 2020-07-05 MED ORDER — BUPROPION HCL ER (XL) 300 MG PO TB24: 300.0000 mg | ORAL_TABLET | Freq: Every day | ORAL | 1 refills | Status: DC

## 2020-07-05 MED ORDER — LEVOCETIRIZINE DIHYDROCHLORIDE 5 MG PO TABS: ORAL_TABLET | ORAL | 1 refills | Status: DC

## 2020-07-06 ENCOUNTER — Other Ambulatory Visit: Payer: Self-pay

## 2020-07-06 DIAGNOSIS — Z471 Aftercare following joint replacement surgery: Secondary | ICD-10-CM | POA: Diagnosis not present

## 2020-07-06 DIAGNOSIS — M545 Low back pain, unspecified: Secondary | ICD-10-CM | POA: Diagnosis not present

## 2020-07-06 DIAGNOSIS — M5441 Lumbago with sciatica, right side: Secondary | ICD-10-CM | POA: Diagnosis not present

## 2020-07-06 DIAGNOSIS — Z96651 Presence of right artificial knee joint: Secondary | ICD-10-CM | POA: Diagnosis not present

## 2020-07-06 MED ORDER — CYCLOBENZAPRINE HCL 10 MG PO TABS: 10.0000 mg | ORAL_TABLET | Freq: Three times a day (TID) | ORAL | 0 refills | Status: DC | PRN

## 2020-07-06 MED ORDER — DIAZEPAM 5 MG PO TABS
5.0000 mg | ORAL_TABLET | Freq: Four times a day (QID) | ORAL | 0 refills | Status: DC | PRN
Start: 2020-07-06 — End: 2020-08-30

## 2020-07-06 NOTE — Telephone Encounter (Signed)
Requesting: diazepam Contract: 03/26/20 UDS: 03/26/20 Last Visit: 04/16/20 Next Visit: 03/29/21 Last Refill: 03/26/20  Please Advise

## 2020-07-07 ENCOUNTER — Telehealth: Payer: Self-pay

## 2020-07-07 NOTE — Telephone Encounter (Signed)
Key: [facility]  PA sent.

## 2020-07-08 DIAGNOSIS — M5136 Other intervertebral disc degeneration, lumbar region: Secondary | ICD-10-CM | POA: Diagnosis not present

## 2020-07-08 DIAGNOSIS — M5135 Other intervertebral disc degeneration, thoracolumbar region: Secondary | ICD-10-CM | POA: Diagnosis not present

## 2020-07-08 DIAGNOSIS — M9903 Segmental and somatic dysfunction of lumbar region: Secondary | ICD-10-CM | POA: Diagnosis not present

## 2020-07-08 DIAGNOSIS — M9904 Segmental and somatic dysfunction of sacral region: Secondary | ICD-10-CM | POA: Diagnosis not present

## 2020-07-08 DIAGNOSIS — M9902 Segmental and somatic dysfunction of thoracic region: Secondary | ICD-10-CM | POA: Diagnosis not present

## 2020-07-08 NOTE — Telephone Encounter (Signed)
Approved on June 29 PA Case: [id number], Status: Approved, Coverage Starts on: 01/10/2020 12:00:00 AM, Coverage Ends on: 01/08/2021 12:00:00 AM. Questions? Contact [phone].

## 2020-07-13 DIAGNOSIS — M5135 Other intervertebral disc degeneration, thoracolumbar region: Secondary | ICD-10-CM | POA: Diagnosis not present

## 2020-07-13 DIAGNOSIS — M5136 Other intervertebral disc degeneration, lumbar region: Secondary | ICD-10-CM | POA: Diagnosis not present

## 2020-07-13 DIAGNOSIS — M9903 Segmental and somatic dysfunction of lumbar region: Secondary | ICD-10-CM | POA: Diagnosis not present

## 2020-07-13 DIAGNOSIS — M9904 Segmental and somatic dysfunction of sacral region: Secondary | ICD-10-CM | POA: Diagnosis not present

## 2020-07-13 DIAGNOSIS — M9902 Segmental and somatic dysfunction of thoracic region: Secondary | ICD-10-CM | POA: Diagnosis not present

## 2020-07-17 DIAGNOSIS — M5135 Other intervertebral disc degeneration, thoracolumbar region: Secondary | ICD-10-CM | POA: Diagnosis not present

## 2020-07-17 DIAGNOSIS — M9904 Segmental and somatic dysfunction of sacral region: Secondary | ICD-10-CM | POA: Diagnosis not present

## 2020-07-17 DIAGNOSIS — M9903 Segmental and somatic dysfunction of lumbar region: Secondary | ICD-10-CM | POA: Diagnosis not present

## 2020-07-17 DIAGNOSIS — M5136 Other intervertebral disc degeneration, lumbar region: Secondary | ICD-10-CM | POA: Diagnosis not present

## 2020-07-17 DIAGNOSIS — M9902 Segmental and somatic dysfunction of thoracic region: Secondary | ICD-10-CM | POA: Diagnosis not present

## 2020-07-20 DIAGNOSIS — M9904 Segmental and somatic dysfunction of sacral region: Secondary | ICD-10-CM | POA: Diagnosis not present

## 2020-07-20 DIAGNOSIS — M9903 Segmental and somatic dysfunction of lumbar region: Secondary | ICD-10-CM | POA: Diagnosis not present

## 2020-07-20 DIAGNOSIS — M9902 Segmental and somatic dysfunction of thoracic region: Secondary | ICD-10-CM | POA: Diagnosis not present

## 2020-07-20 DIAGNOSIS — M5135 Other intervertebral disc degeneration, thoracolumbar region: Secondary | ICD-10-CM | POA: Diagnosis not present

## 2020-07-20 DIAGNOSIS — M5136 Other intervertebral disc degeneration, lumbar region: Secondary | ICD-10-CM | POA: Diagnosis not present

## 2020-07-23 DIAGNOSIS — M9902 Segmental and somatic dysfunction of thoracic region: Secondary | ICD-10-CM | POA: Diagnosis not present

## 2020-07-23 DIAGNOSIS — M5136 Other intervertebral disc degeneration, lumbar region: Secondary | ICD-10-CM | POA: Diagnosis not present

## 2020-07-23 DIAGNOSIS — M5135 Other intervertebral disc degeneration, thoracolumbar region: Secondary | ICD-10-CM | POA: Diagnosis not present

## 2020-07-23 DIAGNOSIS — M9904 Segmental and somatic dysfunction of sacral region: Secondary | ICD-10-CM | POA: Diagnosis not present

## 2020-07-23 DIAGNOSIS — M9903 Segmental and somatic dysfunction of lumbar region: Secondary | ICD-10-CM | POA: Diagnosis not present

## 2020-07-24 ENCOUNTER — Encounter: Payer: Self-pay | Admitting: Family Medicine

## 2020-07-24 DIAGNOSIS — F988 Other specified behavioral and emotional disorders with onset usually occurring in childhood and adolescence: Secondary | ICD-10-CM

## 2020-07-26 MED ORDER — LISDEXAMFETAMINE DIMESYLATE 60 MG PO CAPS: 60.0000 mg | ORAL_CAPSULE | ORAL | 0 refills | Status: DC

## 2020-07-26 NOTE — Telephone Encounter (Signed)
Requesting: Vyvanse  Contract: 03/26/20 UDS: 03/26/20 Last OV: 04/16/20 Next OV: 03/29/21 Last Refill: 06/17/20, #30--0 RF Database:   Please advise

## 2020-07-28 ENCOUNTER — Encounter: Payer: Self-pay | Admitting: Family Medicine

## 2020-07-28 ENCOUNTER — Other Ambulatory Visit: Payer: Self-pay | Admitting: Family Medicine

## 2020-07-28 DIAGNOSIS — M9903 Segmental and somatic dysfunction of lumbar region: Secondary | ICD-10-CM | POA: Diagnosis not present

## 2020-07-28 DIAGNOSIS — F988 Other specified behavioral and emotional disorders with onset usually occurring in childhood and adolescence: Secondary | ICD-10-CM

## 2020-07-28 DIAGNOSIS — M5136 Other intervertebral disc degeneration, lumbar region: Secondary | ICD-10-CM | POA: Diagnosis not present

## 2020-07-28 DIAGNOSIS — M9904 Segmental and somatic dysfunction of sacral region: Secondary | ICD-10-CM | POA: Diagnosis not present

## 2020-07-28 DIAGNOSIS — M5135 Other intervertebral disc degeneration, thoracolumbar region: Secondary | ICD-10-CM | POA: Diagnosis not present

## 2020-07-28 DIAGNOSIS — M9902 Segmental and somatic dysfunction of thoracic region: Secondary | ICD-10-CM | POA: Diagnosis not present

## 2020-07-28 DIAGNOSIS — F419 Anxiety disorder, unspecified: Secondary | ICD-10-CM

## 2020-07-28 NOTE — Telephone Encounter (Signed)
Requesting: Vyvanse Contract: 03/26/20 UDS: 03/26/20 Last OV: 04/16/20 Next OV: 03/29/21 Last Refill: 07/26/2020, #30--0 RF Database:   Please advise

## 2020-07-29 MED ORDER — LISDEXAMFETAMINE DIMESYLATE 60 MG PO CAPS: 60.0000 mg | ORAL_CAPSULE | ORAL | 0 refills | Status: DC

## 2020-07-30 DIAGNOSIS — M9902 Segmental and somatic dysfunction of thoracic region: Secondary | ICD-10-CM | POA: Diagnosis not present

## 2020-07-30 DIAGNOSIS — M9903 Segmental and somatic dysfunction of lumbar region: Secondary | ICD-10-CM | POA: Diagnosis not present

## 2020-07-30 DIAGNOSIS — M5136 Other intervertebral disc degeneration, lumbar region: Secondary | ICD-10-CM | POA: Diagnosis not present

## 2020-07-30 DIAGNOSIS — M9904 Segmental and somatic dysfunction of sacral region: Secondary | ICD-10-CM | POA: Diagnosis not present

## 2020-07-30 DIAGNOSIS — M5135 Other intervertebral disc degeneration, thoracolumbar region: Secondary | ICD-10-CM | POA: Diagnosis not present

## 2020-08-05 DIAGNOSIS — M9902 Segmental and somatic dysfunction of thoracic region: Secondary | ICD-10-CM | POA: Diagnosis not present

## 2020-08-05 DIAGNOSIS — M5136 Other intervertebral disc degeneration, lumbar region: Secondary | ICD-10-CM | POA: Diagnosis not present

## 2020-08-05 DIAGNOSIS — M9903 Segmental and somatic dysfunction of lumbar region: Secondary | ICD-10-CM | POA: Diagnosis not present

## 2020-08-05 DIAGNOSIS — M9904 Segmental and somatic dysfunction of sacral region: Secondary | ICD-10-CM | POA: Diagnosis not present

## 2020-08-05 DIAGNOSIS — M5135 Other intervertebral disc degeneration, thoracolumbar region: Secondary | ICD-10-CM | POA: Diagnosis not present

## 2020-08-06 DIAGNOSIS — M545 Low back pain, unspecified: Secondary | ICD-10-CM | POA: Diagnosis not present

## 2020-08-25 ENCOUNTER — Other Ambulatory Visit: Payer: Self-pay | Admitting: Family Medicine

## 2020-08-25 ENCOUNTER — Encounter: Payer: Self-pay | Admitting: Family Medicine

## 2020-08-25 DIAGNOSIS — F988 Other specified behavioral and emotional disorders with onset usually occurring in childhood and adolescence: Secondary | ICD-10-CM

## 2020-08-25 MED ORDER — LISDEXAMFETAMINE DIMESYLATE 60 MG PO CAPS: 60.0000 mg | ORAL_CAPSULE | ORAL | 0 refills | Status: DC

## 2020-08-26 DIAGNOSIS — M545 Low back pain, unspecified: Secondary | ICD-10-CM | POA: Diagnosis not present

## 2020-08-26 DIAGNOSIS — M25552 Pain in left hip: Secondary | ICD-10-CM | POA: Diagnosis not present

## 2020-08-30 ENCOUNTER — Other Ambulatory Visit: Payer: Self-pay | Admitting: Family Medicine

## 2020-08-30 ENCOUNTER — Encounter: Payer: Self-pay | Admitting: Family Medicine

## 2020-08-30 DIAGNOSIS — F419 Anxiety disorder, unspecified: Secondary | ICD-10-CM

## 2020-08-30 NOTE — Telephone Encounter (Signed)
Requesting: Valium Contract:03/26/2020 UDS:03/26/2020 Last Visit:04/16/2020 Next Visit:03/29/2021 Last Refill:07/06/2020  Please Advise

## 2020-09-10 ENCOUNTER — Telehealth: Payer: Self-pay | Admitting: *Deleted

## 2020-09-10 NOTE — Telephone Encounter (Signed)
No prior auth needed

## 2020-09-10 NOTE — Telephone Encounter (Signed)
Prior auth started via cover my meds.  Awaiting determination.  Key: Theresa [name]

## 2020-09-23 ENCOUNTER — Telehealth: Payer: Medicare HMO | Admitting: Family Medicine

## 2020-09-24 DIAGNOSIS — Z79899 Other long term (current) drug therapy: Secondary | ICD-10-CM | POA: Diagnosis not present

## 2020-09-24 DIAGNOSIS — R059 Cough, unspecified: Secondary | ICD-10-CM | POA: Diagnosis not present

## 2020-09-24 DIAGNOSIS — U071 COVID-19: Secondary | ICD-10-CM | POA: Diagnosis not present

## 2020-09-27 ENCOUNTER — Other Ambulatory Visit: Payer: Self-pay | Admitting: Family Medicine

## 2020-09-27 DIAGNOSIS — F988 Other specified behavioral and emotional disorders with onset usually occurring in childhood and adolescence: Secondary | ICD-10-CM

## 2020-09-27 MED ORDER — LISDEXAMFETAMINE DIMESYLATE 60 MG PO CAPS: 60.0000 mg | ORAL_CAPSULE | ORAL | 0 refills | Status: DC

## 2020-09-27 NOTE — Telephone Encounter (Signed)
Requesting: vyvanse Contract: 03/26/20 UDS: 03/26/20 Last Visit: 04/16/20 Next Visit: 03/09/21 Last Refill: 08/25/20  Please Advise

## 2020-10-06 DIAGNOSIS — C50912 Malignant neoplasm of unspecified site of left female breast: Secondary | ICD-10-CM | POA: Diagnosis not present

## 2020-10-06 DIAGNOSIS — Z853 Personal history of malignant neoplasm of breast: Secondary | ICD-10-CM | POA: Diagnosis not present

## 2020-10-14 DIAGNOSIS — Z853 Personal history of malignant neoplasm of breast: Secondary | ICD-10-CM | POA: Diagnosis not present

## 2020-10-14 DIAGNOSIS — C50912 Malignant neoplasm of unspecified site of left female breast: Secondary | ICD-10-CM | POA: Diagnosis not present

## 2020-10-25 ENCOUNTER — Encounter: Payer: Self-pay | Admitting: Family Medicine

## 2020-10-25 ENCOUNTER — Other Ambulatory Visit: Payer: Self-pay | Admitting: Family Medicine

## 2020-10-25 DIAGNOSIS — G4700 Insomnia, unspecified: Secondary | ICD-10-CM

## 2020-10-25 DIAGNOSIS — F988 Other specified behavioral and emotional disorders with onset usually occurring in childhood and adolescence: Secondary | ICD-10-CM

## 2020-10-25 MED ORDER — TRAZODONE HCL 50 MG PO TABS: 25.0000 mg | ORAL_TABLET | Freq: Every evening | ORAL | 3 refills | Status: DC | PRN

## 2020-10-25 MED ORDER — LISDEXAMFETAMINE DIMESYLATE 60 MG PO CAPS: 60.0000 mg | ORAL_CAPSULE | ORAL | 0 refills | Status: DC

## 2020-10-25 NOTE — Telephone Encounter (Signed)
Requesting: Vyvanse 60mg   Contract: 03/26/20 UDS: 03/26/20 Last Visit: 04/16/20 Next Visit: 03/29/21 Last Refill: 09/27/2020 #30 and 0RF  Please Advise

## 2020-10-26 DIAGNOSIS — M9903 Segmental and somatic dysfunction of lumbar region: Secondary | ICD-10-CM | POA: Diagnosis not present

## 2020-10-26 DIAGNOSIS — M9902 Segmental and somatic dysfunction of thoracic region: Secondary | ICD-10-CM | POA: Diagnosis not present

## 2020-10-26 DIAGNOSIS — M9904 Segmental and somatic dysfunction of sacral region: Secondary | ICD-10-CM | POA: Diagnosis not present

## 2020-10-26 DIAGNOSIS — M5135 Other intervertebral disc degeneration, thoracolumbar region: Secondary | ICD-10-CM | POA: Diagnosis not present

## 2020-10-26 DIAGNOSIS — M5136 Other intervertebral disc degeneration, lumbar region: Secondary | ICD-10-CM | POA: Diagnosis not present

## 2020-10-29 DIAGNOSIS — M5135 Other intervertebral disc degeneration, thoracolumbar region: Secondary | ICD-10-CM | POA: Diagnosis not present

## 2020-10-29 DIAGNOSIS — M9902 Segmental and somatic dysfunction of thoracic region: Secondary | ICD-10-CM | POA: Diagnosis not present

## 2020-10-29 DIAGNOSIS — M9904 Segmental and somatic dysfunction of sacral region: Secondary | ICD-10-CM | POA: Diagnosis not present

## 2020-10-29 DIAGNOSIS — M9903 Segmental and somatic dysfunction of lumbar region: Secondary | ICD-10-CM | POA: Diagnosis not present

## 2020-10-29 DIAGNOSIS — M5136 Other intervertebral disc degeneration, lumbar region: Secondary | ICD-10-CM | POA: Diagnosis not present

## 2020-11-01 DIAGNOSIS — M9902 Segmental and somatic dysfunction of thoracic region: Secondary | ICD-10-CM | POA: Diagnosis not present

## 2020-11-01 DIAGNOSIS — M5136 Other intervertebral disc degeneration, lumbar region: Secondary | ICD-10-CM | POA: Diagnosis not present

## 2020-11-01 DIAGNOSIS — M9903 Segmental and somatic dysfunction of lumbar region: Secondary | ICD-10-CM | POA: Diagnosis not present

## 2020-11-01 DIAGNOSIS — M5135 Other intervertebral disc degeneration, thoracolumbar region: Secondary | ICD-10-CM | POA: Diagnosis not present

## 2020-11-01 DIAGNOSIS — M9904 Segmental and somatic dysfunction of sacral region: Secondary | ICD-10-CM | POA: Diagnosis not present

## 2020-11-04 DIAGNOSIS — M9903 Segmental and somatic dysfunction of lumbar region: Secondary | ICD-10-CM | POA: Diagnosis not present

## 2020-11-04 DIAGNOSIS — M5136 Other intervertebral disc degeneration, lumbar region: Secondary | ICD-10-CM | POA: Diagnosis not present

## 2020-11-04 DIAGNOSIS — M5135 Other intervertebral disc degeneration, thoracolumbar region: Secondary | ICD-10-CM | POA: Diagnosis not present

## 2020-11-04 DIAGNOSIS — M9902 Segmental and somatic dysfunction of thoracic region: Secondary | ICD-10-CM | POA: Diagnosis not present

## 2020-11-04 DIAGNOSIS — M9904 Segmental and somatic dysfunction of sacral region: Secondary | ICD-10-CM | POA: Diagnosis not present

## 2020-11-05 DIAGNOSIS — L84 Corns and callosities: Secondary | ICD-10-CM | POA: Diagnosis not present

## 2020-11-05 DIAGNOSIS — M79671 Pain in right foot: Secondary | ICD-10-CM | POA: Diagnosis not present

## 2020-11-05 DIAGNOSIS — M2041 Other hammer toe(s) (acquired), right foot: Secondary | ICD-10-CM | POA: Diagnosis not present

## 2020-11-08 DIAGNOSIS — M9902 Segmental and somatic dysfunction of thoracic region: Secondary | ICD-10-CM | POA: Diagnosis not present

## 2020-11-08 DIAGNOSIS — M5136 Other intervertebral disc degeneration, lumbar region: Secondary | ICD-10-CM | POA: Diagnosis not present

## 2020-11-08 DIAGNOSIS — M9904 Segmental and somatic dysfunction of sacral region: Secondary | ICD-10-CM | POA: Diagnosis not present

## 2020-11-08 DIAGNOSIS — M5135 Other intervertebral disc degeneration, thoracolumbar region: Secondary | ICD-10-CM | POA: Diagnosis not present

## 2020-11-08 DIAGNOSIS — M9903 Segmental and somatic dysfunction of lumbar region: Secondary | ICD-10-CM | POA: Diagnosis not present

## 2020-11-11 DIAGNOSIS — M9903 Segmental and somatic dysfunction of lumbar region: Secondary | ICD-10-CM | POA: Diagnosis not present

## 2020-11-11 DIAGNOSIS — M9904 Segmental and somatic dysfunction of sacral region: Secondary | ICD-10-CM | POA: Diagnosis not present

## 2020-11-11 DIAGNOSIS — M5135 Other intervertebral disc degeneration, thoracolumbar region: Secondary | ICD-10-CM | POA: Diagnosis not present

## 2020-11-11 DIAGNOSIS — M5136 Other intervertebral disc degeneration, lumbar region: Secondary | ICD-10-CM | POA: Diagnosis not present

## 2020-11-11 DIAGNOSIS — M9902 Segmental and somatic dysfunction of thoracic region: Secondary | ICD-10-CM | POA: Diagnosis not present

## 2020-11-16 ENCOUNTER — Other Ambulatory Visit: Payer: Self-pay | Admitting: Family Medicine

## 2020-11-16 DIAGNOSIS — M9903 Segmental and somatic dysfunction of lumbar region: Secondary | ICD-10-CM | POA: Diagnosis not present

## 2020-11-16 DIAGNOSIS — M5136 Other intervertebral disc degeneration, lumbar region: Secondary | ICD-10-CM | POA: Diagnosis not present

## 2020-11-16 DIAGNOSIS — M9904 Segmental and somatic dysfunction of sacral region: Secondary | ICD-10-CM | POA: Diagnosis not present

## 2020-11-16 DIAGNOSIS — M5135 Other intervertebral disc degeneration, thoracolumbar region: Secondary | ICD-10-CM | POA: Diagnosis not present

## 2020-11-16 DIAGNOSIS — M9902 Segmental and somatic dysfunction of thoracic region: Secondary | ICD-10-CM | POA: Diagnosis not present

## 2020-11-16 DIAGNOSIS — K2970 Gastritis, unspecified, without bleeding: Secondary | ICD-10-CM

## 2020-11-19 ENCOUNTER — Other Ambulatory Visit: Payer: Self-pay | Admitting: Family Medicine

## 2020-11-19 DIAGNOSIS — G4700 Insomnia, unspecified: Secondary | ICD-10-CM

## 2020-11-24 ENCOUNTER — Other Ambulatory Visit: Payer: Self-pay | Admitting: Family Medicine

## 2020-11-24 DIAGNOSIS — F418 Other specified anxiety disorders: Secondary | ICD-10-CM

## 2020-11-24 DIAGNOSIS — J302 Other seasonal allergic rhinitis: Secondary | ICD-10-CM

## 2020-11-27 ENCOUNTER — Encounter: Payer: Self-pay | Admitting: Family Medicine

## 2020-11-27 DIAGNOSIS — F988 Other specified behavioral and emotional disorders with onset usually occurring in childhood and adolescence: Secondary | ICD-10-CM

## 2020-11-29 MED ORDER — LISDEXAMFETAMINE DIMESYLATE 60 MG PO CAPS: 60.0000 mg | ORAL_CAPSULE | ORAL | 0 refills | Status: DC

## 2020-11-29 NOTE — Telephone Encounter (Signed)
Requesting: Vyvanse Contract: 03/22 UDS: 03/22 Last OV: 04/16/20 Next OV: 03/29/21 Last Refill: 10/25/2020, #30--0 RF Database:   Please advise

## 2020-12-14 DIAGNOSIS — M5136 Other intervertebral disc degeneration, lumbar region: Secondary | ICD-10-CM | POA: Diagnosis not present

## 2020-12-14 DIAGNOSIS — M9903 Segmental and somatic dysfunction of lumbar region: Secondary | ICD-10-CM | POA: Diagnosis not present

## 2020-12-14 DIAGNOSIS — M5135 Other intervertebral disc degeneration, thoracolumbar region: Secondary | ICD-10-CM | POA: Diagnosis not present

## 2020-12-14 DIAGNOSIS — M9904 Segmental and somatic dysfunction of sacral region: Secondary | ICD-10-CM | POA: Diagnosis not present

## 2020-12-14 DIAGNOSIS — M9902 Segmental and somatic dysfunction of thoracic region: Secondary | ICD-10-CM | POA: Diagnosis not present

## 2021-01-02 ENCOUNTER — Encounter: Payer: Self-pay | Admitting: Family Medicine

## 2021-01-02 DIAGNOSIS — F988 Other specified behavioral and emotional disorders with onset usually occurring in childhood and adolescence: Secondary | ICD-10-CM

## 2021-01-04 NOTE — Telephone Encounter (Addendum)
Per PDMP, Vyvanse was picked up on 01/03/2021

## 2021-01-04 NOTE — Telephone Encounter (Signed)
Requesting: Vyvanse  Contract: 03/26/2020 UDS: 03/26/2020 Last OV: 04/16/2020 Next OV: 01/12/2020 Last Refill: 11/29/2020, #30--0 RF Database:   Please advise

## 2021-01-11 ENCOUNTER — Encounter: Payer: Self-pay | Admitting: Family Medicine

## 2021-01-11 ENCOUNTER — Telehealth (INDEPENDENT_AMBULATORY_CARE_PROVIDER_SITE_OTHER): Payer: Medicare HMO | Admitting: Family Medicine

## 2021-01-11 VITALS — Temp 97.8°F | Ht 64.0 in | Wt 195.0 lb

## 2021-01-11 DIAGNOSIS — F988 Other specified behavioral and emotional disorders with onset usually occurring in childhood and adolescence: Secondary | ICD-10-CM | POA: Diagnosis not present

## 2021-01-11 NOTE — Assessment & Plan Note (Signed)
Pt will look at her formulary once she gets the book and will let us know what her ins will pay for

## 2021-01-11 NOTE — Progress Notes (Signed)
Virtual telephone visit    Virtual Visit via Telephone Note   This visit type was conducted due to national recommendations for restrictions regarding the COVID-19 Pandemic (e.g. social distancing) in an effort to limit this patient's exposure and mitigate transmission in our community. Due to her co-morbid illnesses, this patient is at least at moderate risk for complications without adequate follow up. This format is felt to be most appropriate for this patient at this time. The patient did not have access to video technology or had technical difficulties with video requiring transitioning to audio format only (telephone). Physical exam was limited to content and character of the telephone converstion. mei was able to get the patient set up on a telephone visit.  Video connection was lost at <50% of the duration of the visit, at which time the remainder of the visit was completed via audio only.<  Patient location: Home  Patient and provider in visit Provider location: Office  I discussed the limitations of evaluation and management by telemedicine and the availability of in person appointments. The patient expressed understanding and agreed to proceed.   Visit Date: 01/11/2021  Today's healthcare provider: Ann Held, [name]     Subjective:    Patient ID: Theresa [name], female    DOB: Nov 22, 1955, 66 y.o.   MRN: [id number]  Chief Complaint  Patient presents with   ADD    Medication that will be covered via [place]     HPI Patient is in today for f/u ADD--- her ins will no longer cover vyvanse and she is waiting for her formulary book to come  No other complaints   Past Medical History:  Diagnosis Date   ADD (attention deficit disorder)    Anemia    Arthritis    neck   Breast cancer ([place])    Left breast   Complication of anesthesia    1 time "I felt everything" - approx 2005, - or "maybe 1986 or 2010- not sure"   Constipation    probotic     Depression    Diverticulitis    Family history of adverse reaction to anesthesia    other  and PG- wake up during surgey   Fibromyalgia    GERD (gastroesophageal reflux disease)    occ   History of blood transfusion    History of stomach ulcers    Hypertension    "white coat syndrome"   Migraines    migraine since age 62    Pleurisy    Pneumonia    age 12   PONV (postoperative nausea and vomiting)    Urinary incontinence     Past Surgical History:  Procedure Laterality Date   ANTERIOR CERVICAL DECOMPRESSION/DISCECTOMY FUSION 4 LEVELS N/A 08/01/2018   Procedure: ANTERIOR CERVICAL DECOMPRESSION FUSION, CERVICAL FOUR TO FIVE, CERVICAL FIVE TO SIX , CERVICAL SIX TO SEVEN WITH INSTRUMENTATION AND ALLOGRAFT;  Surgeon: Phylliss Bob, MD;  Location: [place];  Service: Orthopedics;  Laterality: N/A;   APPENDECTOMY  1960   BREAST BIOPSY Left [place] and 2010   x's 2   CHOLECYSTECTOMY  1987   COLONOSCOPY     LASIK  2012   MASTECTOMY Left 1985    Family History  Problem Relation Age of Onset   Breast cancer Mother    Depression Mother    Heart failure Father    Depression Father    Arthritis Maternal Grandmother     Social  History   Socioeconomic History   Marital status: Married    Spouse name: Not on file   Number of children: Not on file   Years of education: Not on file   Highest education level: Not on file  Occupational History   Occupation: Pharmacologist    Employer: [employer]  Tobacco Use   Smoking status: Never   Smokeless tobacco: Never  Vaping Use   Vaping Use: Never used  Substance and Sexual Activity   Alcohol use: Yes    Comment: occasionally   Drug use: No   Sexual activity: Yes  Other Topics Concern   Not on file  Social History Narrative   Not on file   Social Determinants of Health   Financial Resource Strain: Not on file  Food Insecurity: Not on file  Transportation Needs: Not on file  Physical  Activity: Not on file  Stress: Not on file  Social Connections: Not on file  Intimate Partner Violence: Not on file    Outpatient Medications Prior to Visit  Medication Sig Dispense Refill   buPROPion (WELLBUTRIN XL) 300 MG 24 hr tablet TAKE 1 TABLET EVERY DAY 90 tablet 1   fluticasone (FLONASE) 50 MCG/ACT nasal spray Place 2 sprays into both nostrils daily as needed for allergies or rhinitis. 48 mL 1   hydrOXYzine (ATARAX/VISTARIL) 25 MG tablet TAKE 1 TABLET THREE TIMES DAILY AS NEEDED 90 tablet 1   levocetirizine (XYZAL) 5 MG tablet TAKE 1 TABLET EVERY DAY IN THE EVENING 90 tablet 1   lisdexamfetamine (VYVANSE) 60 MG capsule Take 1 capsule (60 mg total) by mouth every morning. 30 capsule 0   pantoprazole (PROTONIX) 40 MG tablet TAKE 1 TABLET EVERY DAY 90 tablet 1   traZODone (DESYREL) 50 MG tablet TAKE 1/2-1 TABLET BY MOUTH AT BEDTIME AS NEEDED FOR SLEEP. 90 tablet 2   cyclobenzaprine (FLEXERIL) 10 MG tablet Take 1 tablet (10 mg total) by mouth every 8 (eight) hours as needed. (Patient not taking: Reported on 01/11/2021) 90 tablet 0   diazepam (VALIUM) 5 MG tablet TAKE 1 TABLET EVERY 6 HOURS AS NEEDED FOR MUSCLE SPASM(S) (Patient not taking: Reported on 01/11/2021) 30 tablet 0   No facility-administered medications prior to visit.    Allergies  Allergen Reactions   Nitroglycerin     Migraine   Oxycodone Itching    Review of Systems  Constitutional:  Negative for fever and malaise/fatigue.  HENT:  Negative for congestion.   Eyes:  Negative for blurred vision.  Respiratory:  Negative for shortness of breath.   Cardiovascular:  Negative for chest pain, palpitations and leg swelling.  Gastrointestinal:  Negative for abdominal pain, blood in stool and nausea.  Genitourinary:  Negative for dysuria and frequency.  Musculoskeletal:  Negative for falls.  Skin:  Negative for rash.  Neurological:  Negative for dizziness, loss of consciousness and headaches.  Endo/Heme/Allergies:  Negative  for environmental allergies.  Psychiatric/Behavioral:  Negative for depression. The patient is not nervous/anxious.       Objective:    Physical Exam Vitals and nursing note reviewed.  Constitutional:      Appearance: She is well-developed.  Eyes:     Conjunctiva/sclera: Conjunctivae normal.  Neck:     Thyroid: No thyromegaly.     Vascular: No JVD.  Pulmonary:     Effort: Pulmonary effort is normal.  Neurological:     Mental Status: She is alert.  Psychiatric:        Mood and  Affect: Mood normal.        Behavior: Behavior normal.        Thought Content: Thought content normal.    Temp 97.8 F (36.6 C) (Oral)    Ht 5\' 4"  (1.626 m)    Wt 195 lb (88.5 kg)    BMI 33.47 kg/m  Wt Readings from Last 3 Encounters:  01/11/21 195 lb (88.5 kg)  04/16/20 194 lb 3.2 oz (88.1 kg)  03/26/20 193 lb 9.6 oz (87.8 kg)    Diabetic Foot Exam - Simple   No data filed    Lab Results  Component Value Date   WBC 4.2 03/26/2020   HGB 13.1 03/26/2020   HCT 39.8 03/26/2020   PLT 269.0 03/26/2020   GLUCOSE 88 03/26/2020   CHOL 184 03/26/2020   TRIG 106.0 03/26/2020   HDL 71.40 03/26/2020   LDLCALC 92 03/26/2020   ALT 18 03/26/2020   AST 16 03/26/2020   NA 142 03/26/2020   K 4.3 03/26/2020   CL 104 03/26/2020   CREATININE 0.81 03/26/2020   BUN 18 03/26/2020   CO2 28 03/26/2020   TSH 1.50 09/28/2014   INR 0.9 07/29/2018    Lab Results  Component Value Date   TSH 1.50 09/28/2014   Lab Results  Component Value Date   WBC 4.2 03/26/2020   HGB 13.1 03/26/2020   HCT 39.8 03/26/2020   MCV 81.7 03/26/2020   PLT 269.0 03/26/2020   Lab Results  Component Value Date   NA 142 03/26/2020   K 4.3 03/26/2020   CO2 28 03/26/2020   GLUCOSE 88 03/26/2020   BUN 18 03/26/2020   CREATININE 0.81 03/26/2020   BILITOT 0.3 03/26/2020   ALKPHOS 58 03/26/2020   AST 16 03/26/2020   ALT 18 03/26/2020   PROT 6.7 03/26/2020   ALBUMIN 4.1 03/26/2020   CALCIUM 9.6 03/26/2020   ANIONGAP 11  03/13/2019   GFR 75.88 03/26/2020   Lab Results  Component Value Date   CHOL 184 03/26/2020   Lab Results  Component Value Date   HDL 71.40 03/26/2020   Lab Results  Component Value Date   LDLCALC 92 03/26/2020   Lab Results  Component Value Date   TRIG 106.0 03/26/2020   Lab Results  Component Value Date   CHOLHDL 3 03/26/2020   No results found for: HGBA1C     Assessment & Plan:   Problem List Items Addressed This Visit   None   I have discontinued Lynnelle A. Miggins's diazepam. I am also having her maintain her fluticasone, cyclobenzaprine, hydrOXYzine, pantoprazole, traZODone, levocetirizine, buPROPion, and lisdexamfetamine.  No orders of the defined types were placed in this encounter.    I discussed the assessment and treatment plan with the patient. The patient was provided an opportunity to ask questions and all were answered. The patient agreed with the plan and demonstrated an understanding of the instructions.   The patient was advised to call back or seek an in-person evaluation if the symptoms worsen or if the condition fails to improve as anticipated.  I provided 25 minutes of non-face-to-face time during this encounter.   Ann Held, [name] [place] at [facility] [phone] (phone) [phone] (fax)  [place]

## 2021-01-25 ENCOUNTER — Other Ambulatory Visit: Payer: Self-pay | Admitting: Family Medicine

## 2021-01-25 ENCOUNTER — Encounter: Payer: Self-pay | Admitting: Family Medicine

## 2021-01-25 DIAGNOSIS — F988 Other specified behavioral and emotional disorders with onset usually occurring in childhood and adolescence: Secondary | ICD-10-CM

## 2021-01-25 MED ORDER — METHYLPHENIDATE HCL 20 MG PO TABS: 20.0000 mg | ORAL_TABLET | Freq: Two times a day (BID) | ORAL | 0 refills | Status: DC

## 2021-01-27 NOTE — Telephone Encounter (Signed)
Med was sent to the wrong pharmacy. Please resend

## 2021-01-28 ENCOUNTER — Other Ambulatory Visit: Payer: Self-pay | Admitting: Family Medicine

## 2021-01-28 DIAGNOSIS — F988 Other specified behavioral and emotional disorders with onset usually occurring in childhood and adolescence: Secondary | ICD-10-CM

## 2021-01-28 MED ORDER — METHYLPHENIDATE HCL 20 MG PO TABS: 20.0000 mg | ORAL_TABLET | Freq: Two times a day (BID) | ORAL | 0 refills | Status: DC

## 2021-02-01 NOTE — Telephone Encounter (Signed)
[facility] to [facility], and she has been w/o medication for two weeks. In [facility] Please advise.   [id number] [place], [place] - [address]  [address], [place] [address]  Phone:  [phone]  Fax:  [phone]

## 2021-02-05 ENCOUNTER — Other Ambulatory Visit: Payer: Self-pay | Admitting: Family Medicine

## 2021-02-05 DIAGNOSIS — F419 Anxiety disorder, unspecified: Secondary | ICD-10-CM

## 2021-02-18 ENCOUNTER — Other Ambulatory Visit: Payer: Self-pay | Admitting: Family Medicine

## 2021-02-18 ENCOUNTER — Encounter: Payer: Self-pay | Admitting: Family Medicine

## 2021-02-18 DIAGNOSIS — F988 Other specified behavioral and emotional disorders with onset usually occurring in childhood and adolescence: Secondary | ICD-10-CM

## 2021-02-18 DIAGNOSIS — G4700 Insomnia, unspecified: Secondary | ICD-10-CM

## 2021-02-25 MED ORDER — TRAZODONE HCL 50 MG PO TABS: ORAL_TABLET | ORAL | 1 refills | Status: DC

## 2021-02-25 NOTE — Telephone Encounter (Signed)
Requesting:Ritalin Contract: 03/26/2020 UDS: 03/26/2020 Last OV: 01/11/2021 Next OV: 03/29/2021 Last Refill: 01/28/21, #180--0 RF Database:   Please advise

## 2021-02-28 MED ORDER — METHYLPHENIDATE HCL 20 MG PO TABS: 20.0000 mg | ORAL_TABLET | Freq: Two times a day (BID) | ORAL | 0 refills | Status: DC

## 2021-03-04 ENCOUNTER — Other Ambulatory Visit: Payer: Self-pay | Admitting: Family Medicine

## 2021-03-04 DIAGNOSIS — F988 Other specified behavioral and emotional disorders with onset usually occurring in childhood and adolescence: Secondary | ICD-10-CM

## 2021-03-17 ENCOUNTER — Encounter: Payer: Self-pay | Admitting: Family Medicine

## 2021-03-18 ENCOUNTER — Other Ambulatory Visit: Payer: Self-pay | Admitting: Family Medicine

## 2021-03-18 DIAGNOSIS — R41840 Attention and concentration deficit: Secondary | ICD-10-CM

## 2021-03-21 ENCOUNTER — Other Ambulatory Visit: Payer: Self-pay | Admitting: Family Medicine

## 2021-03-21 MED ORDER — METHYLPHENIDATE HCL 20 MG PO TABS: 20.0000 mg | ORAL_TABLET | Freq: Three times a day (TID) | ORAL | 0 refills | Status: DC

## 2021-03-29 ENCOUNTER — Encounter: Payer: Medicare HMO | Admitting: Family Medicine

## 2021-03-31 ENCOUNTER — Ambulatory Visit: Payer: Medicare HMO

## 2021-03-31 NOTE — Progress Notes (Deleted)
? ?Subjective:  ? Theresa [name] is a 66 y.o. female who presents for Medicare Annual (Subsequent) preventive examination. ? ?Review of Systems    ?*** ?  ? ?   ?Objective:  ?  ?There were no vitals filed for this visit. ?There is no height or weight on file to calculate BMI. ? ? ?  03/26/2020  ?  8:39 AM 07/29/2018  ?  9:35 AM  ?Advanced Directives  ?Does Patient Have a Medical Advance Directive? No No  ?Would patient like information on creating a medical advance directive? Yes (MAU/Ambulatory/Procedural Areas - Information given) Yes (MAU/Ambulatory/Procedural Areas - Information given)  ? ? ?Current Medications (verified) ?Outpatient Encounter Medications as of 03/31/2021  ?Medication Sig  ? buPROPion (WELLBUTRIN XL) 300 MG 24 hr tablet TAKE 1 TABLET EVERY DAY  ? cyclobenzaprine (FLEXERIL) 10 MG tablet TAKE 1 TABLET (10 MG TOTAL) BY MOUTH EVERY 8  HOURS AS NEEDED.  ? fluticasone (FLONASE) 50 MCG/ACT nasal spray Place 2 sprays into both nostrils daily as needed for allergies or rhinitis.  ? hydrOXYzine (ATARAX) 25 MG tablet TAKE 1 TABLET THREE TIMES DAILY AS NEEDED  ? levocetirizine (XYZAL) 5 MG tablet TAKE 1 TABLET EVERY DAY IN THE EVENING  ? methylphenidate (RITALIN) 20 MG tablet Take 1 tablet (20 mg total) by mouth 3 (three) times daily with meals.  ? pantoprazole (PROTONIX) 40 MG tablet TAKE 1 TABLET EVERY DAY  ? traZODone (DESYREL) 50 MG tablet TAKE 1/2-1 TABLET BY MOUTH AT BEDTIME AS NEEDED FOR SLEEP.  ? ?No facility-administered encounter medications on file as of 03/31/2021.  ? ? ?Allergies (verified) ?Nitroglycerin and Oxycodone  ? ?History: ?Past Medical History:  ?Diagnosis Date  ? ADD (attention deficit disorder)   ? Anemia   ? Arthritis   ? neck  ? Breast cancer ([place])   ? Left breast  ? Complication of anesthesia   ? 1 time "I felt everything" - approx 2005, - or "maybe 1986 or 2010- not sure"  ? Constipation   ? probotic   ? Depression   ? Diverticulitis   ? Family history of adverse reaction to  anesthesia   ? other  and PG- wake up during surgey  ? Fibromyalgia   ? GERD (gastroesophageal reflux disease)   ? occ  ? History of blood transfusion   ? History of stomach ulcers   ? Hypertension   ? "white coat syndrome"  ? Migraines   ? migraine since age 75   ? Pleurisy   ? Pneumonia   ? age 60  ? PONV (postoperative nausea and vomiting)   ? Urinary incontinence   ? ?Past Surgical History:  ?Procedure Laterality Date  ? ANTERIOR CERVICAL DECOMPRESSION/DISCECTOMY FUSION 4 LEVELS N/A 08/01/2018  ? Procedure: ANTERIOR CERVICAL DECOMPRESSION FUSION, CERVICAL FOUR TO FIVE, CERVICAL FIVE TO SIX , CERVICAL SIX TO SEVEN WITH INSTRUMENTATION AND ALLOGRAFT;  Surgeon: Phylliss Bob, MD;  Location: [facility];  Service: Orthopedics;  Laterality: N/A;  ? APPENDECTOMY  1960  ? BREAST BIOPSY Left 1985  ? BREAST RECONSTRUCTION  1986 and 2010  ? x's 2  ? CHOLECYSTECTOMY  1987  ? COLONOSCOPY    ? LASIK  2012  ? MASTECTOMY Left 1985  ? ?Family History  ?Problem Relation Age of Onset  ? Breast cancer Mother   ? Depression Mother   ? Heart failure Father   ? Depression Father   ? Arthritis Maternal Grandmother   ? ?Social History  ? ?Socioeconomic History  ?  Marital status: Married  ?  Spouse name: Not on file  ? Number of children: Not on file  ? Years of education: Not on file  ? Highest education level: Not on file  ?Occupational History  ? Occupation: Pharmacologist  ?  Employer: [employer]  ?Tobacco Use  ? Smoking status: Never  ? Smokeless tobacco: Never  ?Vaping Use  ? Vaping Use: Never used  ?Substance and Sexual Activity  ? Alcohol use: Yes  ?  Comment: occasionally  ? Drug use: No  ? Sexual activity: Yes  ?Other Topics Concern  ? Not on file  ?Social History Narrative  ? Not on file  ? ?Social Determinants of Health  ? ?Financial Resource Strain: Not on file  ?Food Insecurity: Not on file  ?Transportation Needs: Not on file  ?Physical Activity: Not on file  ?Stress: Not on file  ?Social Connections: Not on file   ? ? ?Tobacco Counseling ?Counseling given: Not Answered ? ? ?Clinical Intake: ? ?  ? ?  ? ?  ? ?  ? ?  ? ?Diabetic?No ? ?  ? ?  ? ? ?Activities of Daily Living ?   ? View : No data to display.  ?  ?  ?  ? ? ?Patient Care Team: ?Carollee Herter, Alferd Apa, DO as PCP - General (Family Medicine) ? ?Indicate any recent Medical Services you may have received from other than [facility] providers in the past year (date may be approximate). ? ?   ?Assessment:  ? This is a routine wellness examination for Nayra. ? ?Hearing/Vision screen ?No results found. ? ?Dietary issues and exercise activities discussed: ?  ? ? Goals Addressed   ?None ?  ? ?Depression Screen ? ?  01/11/2021  ?  5:30 PM 03/26/2020  ?  8:23 AM 06/11/2018  ? 10:31 AM 08/30/2017  ?  9:15 AM 01/13/2016  ?  4:07 PM  ?PHQ 2/9 Scores  ?PHQ - 2 Score 0 '2 2 2 '$ 0  ?PHQ- 9 Score   7    ?  ?Fall Risk ? ?  01/11/2021  ?  5:30 PM 03/26/2020  ?  8:36 AM  ?Fall Risk   ?Falls in the past year? 0 0  ?Number falls in past yr: 0 0  ?Injury with Fall? 0 0  ?Risk for fall due to : No Fall Risks No Fall Risks  ?Follow up Falls evaluation completed   ? ? ?FALL RISK PREVENTION PERTAINING TO THE HOME: ? ?Any stairs in or around the home? {YES/NO:21197} ?If so, are there any without handrails? {YES/NO:21197} ?Home free of loose throw rugs in walkways, pet beds, electrical cords, etc? {YES/NO:21197} ?Adequate lighting in your home to reduce risk of falls? {YES/NO:21197} ? ?ASSISTIVE DEVICES UTILIZED TO PREVENT FALLS: ? ?Life alert? {YES/NO:21197} ?Use of a cane, walker or w/c? {YES/NO:21197} ?Grab bars in the bathroom? {YES/NO:21197} ?Shower chair or bench in shower? {YES/NO:21197} ?Elevated toilet seat or a handicapped toilet? {YES/NO:21197} ? ?TIMED UP AND GO: ? ?Was the test performed? {YES/NO:21197}.  ?Length of time to ambulate 10 feet: *** sec.  ? ?{Appearance of Gait:2101803} ? ?Cognitive Function: ? ?  03/26/2020  ?  8:38 AM  ?MMSE - Mini Mental State Exam  ?Orientation to time 5  ?Orientation to  Place 5  ?Registration 3  ?Attention/ Calculation 5  ?Recall 3  ?Language- name 2 objects 2  ?Language- repeat 1  ?Language- follow 3 step command 3  ?Language- read & follow  direction 1  ?Write a sentence 1  ?Copy design 1  ?Total score 30  ? ?  ?  ? ?Immunizations ?Immunization History  ?Administered Date(s) Administered  ? Zoster, Live 09/28/2014  ? ? ?{TDAP status:2101805} ? ?{Flu Vaccine status:2101806} ? ?{Pneumococcal vaccine status:2101807} ? ?{Covid-19 vaccine status:2101808} ? ?Qualifies for Shingles Vaccine? Yes   ?Zostavax completed {YES/NO:21197}  ?{Shingrix Completed?:2101804} ? ?Screening Tests ?Health Maintenance  ?Topic Date Due  ? COVID-19 Vaccine (1) Never done  ? Pneumonia Vaccine 42+ Years old (1 - PCV) Never done  ? Zoster Vaccines- Shingrix (1 of 2) Never done  ? MAMMOGRAM  06/04/2015  ? DEXA SCAN  Never done  ? COLONOSCOPY (Pts 45-64yr Insurance coverage will need to be confirmed)  01/10/2020  ? INFLUENZA VACCINE  Never done  ? TETANUS/TDAP  06/04/2023  ? Hepatitis C Screening  Completed  ? HPV VACCINES  Aged Out  ? ? ?Health Maintenance ? ?Health Maintenance Due  ?Topic Date Due  ? COVID-19 Vaccine (1) Never done  ? Pneumonia Vaccine 67 Years old (1 - PCV) Never done  ? Zoster Vaccines- Shingrix (1 of 2) Never done  ? MAMMOGRAM  06/04/2015  ? DEXA SCAN  Never done  ? COLONOSCOPY (Pts 45-496yrInsurance coverage will need to be confirmed)  01/10/2020  ? INFLUENZA VACCINE  Never done  ? ? ?{Colorectal cancer screening:2101809} ? ?{Mammogram status:21018020} ? ?{Bone Density status:21018021} ? ?Lung Cancer Screening: (Low Dose CT Chest recommended if Age 67-80ears, 30 pack-year currently smoking OR have quit w/in 15years.) does not qualify.  ? ?Lung Cancer Screening Referral: N/A ? ?Additional Screening: ? ?Hepatitis C Screening: does qualify; Completed 09/25/2014 ? ?Vision Screening: Recommended annual ophthalmology exams for early detection of glaucoma and other disorders of the eye. ?Is  the patient up to date with their annual eye exam?  {YES/NO:21197} ?Who is the provider or what is the name of the office in which the patient attends annual eye exams? *** ?If pt is not established with a pr

## 2021-04-12 ENCOUNTER — Other Ambulatory Visit: Payer: Self-pay | Admitting: Family Medicine

## 2021-04-12 ENCOUNTER — Ambulatory Visit (INDEPENDENT_AMBULATORY_CARE_PROVIDER_SITE_OTHER): Payer: Medicare HMO

## 2021-04-12 ENCOUNTER — Telehealth: Payer: Self-pay

## 2021-04-12 DIAGNOSIS — Z0000 Encounter for general adult medical examination without abnormal findings: Secondary | ICD-10-CM

## 2021-04-12 DIAGNOSIS — H9190 Unspecified hearing loss, unspecified ear: Secondary | ICD-10-CM

## 2021-04-12 DIAGNOSIS — G4700 Insomnia, unspecified: Secondary | ICD-10-CM

## 2021-04-12 MED ORDER — MELATONIN-GABA-VALERIAN 6-30-50 MG PO CAPS: ORAL_CAPSULE | ORAL | 11 refills | Status: DC

## 2021-04-12 MED ORDER — DIAZEPAM 5 MG PO TABS: ORAL_TABLET | ORAL | 1 refills | Status: DC

## 2021-04-12 NOTE — Progress Notes (Signed)
? ?Subjective:  ? Theresa [name] is a 66 y.o. female who presents for Medicare Annual (Subsequent) preventive examination. ? ?I connected with  Azoria A Casselman on 04/12/21 by a audio enabled telemedicine application and verified that I am speaking with the correct person using two identifiers. ? ?Patient Location: Home ? ?Provider Location: Office/Clinic ? ?I discussed the limitations of evaluation and management by telemedicine. The patient expressed understanding and agreed to proceed.  ? ?Review of Systems    ? ?Cardiac Risk Factors include: advanced age (>78mn, >>4women);hypertension ? ?   ?Objective:  ?  ?There were no vitals filed for this visit. ?There is no height or weight on file to calculate BMI. ? ? ?  04/12/2021  ?  8:30 AM 03/26/2020  ?  8:39 AM 07/29/2018  ?  9:35 AM  ?Advanced Directives  ?Does Patient Have a Medical Advance Directive? No No No  ?Would patient like information on creating a medical advance directive? No - Patient declined Yes (MAU/Ambulatory/Procedural Areas - Information given) Yes (MAU/Ambulatory/Procedural Areas - Information given)  ? ? ?Current Medications (verified) ?Outpatient Encounter Medications as of 04/12/2021  ?Medication Sig  ? buPROPion (WELLBUTRIN XL) 300 MG 24 hr tablet TAKE 1 TABLET EVERY DAY  ? cyclobenzaprine (FLEXERIL) 10 MG tablet TAKE 1 TABLET (10 MG TOTAL) BY MOUTH EVERY 8  HOURS AS NEEDED.  ? levocetirizine (XYZAL) 5 MG tablet TAKE 1 TABLET EVERY DAY IN THE EVENING  ? methylphenidate (RITALIN) 20 MG tablet Take 1 tablet (20 mg total) by mouth 3 (three) times daily with meals.  ? pantoprazole (PROTONIX) 40 MG tablet TAKE 1 TABLET EVERY DAY  ? traZODone (DESYREL) 50 MG tablet TAKE 1/2-1 TABLET BY MOUTH AT BEDTIME AS NEEDED FOR SLEEP.  ? diazepam (VALIUM) 5 MG tablet Take by mouth.  ? [DISCONTINUED] fluticasone (FLONASE) 50 MCG/ACT nasal spray Place 2 sprays into both nostrils daily as needed for allergies or rhinitis.  ? [DISCONTINUED] hydrOXYzine (ATARAX) 25 MG  tablet TAKE 1 TABLET THREE TIMES DAILY AS NEEDED  ? ?No facility-administered encounter medications on file as of 04/12/2021.  ? ? ?Allergies (verified) ?Nitroglycerin and Oxycodone  ? ?History: ?Past Medical History:  ?Diagnosis Date  ? ADD (attention deficit disorder)   ? Anemia   ? Arthritis   ? neck  [place]   ? [place]  ? Complication of anesthesia   ? 1 time "I felt everything" - approx 2005, - or "maybe 1986 or 2010- not sure"  ? Constipation   ? probotic   ? Depression   ? Diverticulitis   ? Family history of adverse reaction to anesthesia   ? other  and PG- wake up during surgey  ? Fibromyalgia   ? GERD (gastroesophageal reflux disease)   ? occ  ? History of blood transfusion   ? History of stomach ulcers   ? Hypertension   ? "white coat syndrome"  ? Migraines   ? migraine since age 66  ? Pleurisy   ? Pneumonia   ? age 66 ? PONV (postoperative nausea and vomiting)   ? Urinary incontinence   ? ?Past Surgical History:  ?Procedure Laterality Date  ? ANTERIOR CERVICAL DECOMPRESSION/DISCECTOMY FUSION 4 LEVELS N/A 08/01/2018  ? Procedure: ANTERIOR CERVICAL DECOMPRESSION FUSION, CERVICAL FOUR TO FIVE, CERVICAL FIVE TO SIX , CERVICAL SIX TO SEVEN WITH INSTRUMENTATION AND ALLOGRAFT;  Surgeon: DPhylliss Bob MD;  [place] [facility]  Service: Orthopedics;  Laterality: N/A;  ? APPENDECTOMY  1960  ? BREAST BIOPSY Left 1985  ? BREAST RECONSTRUCTION  1986 and 2010  ? x's 2  ? CHOLECYSTECTOMY  1987  ? COLONOSCOPY    ? LASIK  2012  ? MASTECTOMY Left 1985  ? ?Family History  ?Problem Relation Age of Onset  ? Breast cancer Mother   ? Depression Mother   ? Heart failure Father   ? Depression Father   ? Arthritis Maternal Grandmother   ? ?Social History  ? ?Socioeconomic History  ? Marital status: Married  ?  Spouse name: Not on file  ? Number of children: Not on file  ? Years of education: Not on file  ? Highest education level: Not on file  ?Occupational History  ? Occupation: Pharmacologist  ?  Employer:  [employer]  ?Tobacco Use  ? Smoking status: Never  ? Smokeless tobacco: Never  ?Vaping Use  ? Vaping Use: Never used  ?Substance and Sexual Activity  ? Alcohol use: Yes  ?  Comment: occasionally  ? Drug use: No  ? Sexual activity: Yes  ?Other Topics Concern  ? Not on file  ?Social History Narrative  ? Not on file  ? ?Social Determinants of Health  ? ?Financial Resource Strain: Low Risk   ? Difficulty of Paying Living Expenses: Not hard at all  ?Food Insecurity: No Food Insecurity  ? Worried About Charity fundraiser in the Last Year: Never true  ? Ran Out of Food in the Last Year: Never true  ?Transportation Needs: No Transportation Needs  ? Lack of Transportation (Medical): No  ? Lack of Transportation (Non-Medical): No  ?Physical Activity: Not on file  ?Stress: No Stress Concern Present  ? Feeling of Stress : Not at all  ?Social Connections: Socially Integrated  ? Frequency of Communication with Friends and Family: More than three times a week  ? Frequency of Social Gatherings with Friends and Family: More than three times a week  ? Attends Religious Services: More than 4 times per year  ? Active Member of Clubs or Organizations: Yes  ? Attends Archivist Meetings: More than 4 times per year  ? Marital Status: Married  ? ? ?Tobacco Counseling ?Counseling given: Not Answered ? ? ?Clinical Intake: ? ?Pre-visit preparation completed: Yes ? ?Pain : No/denies pain ? ?  ? ?Nutritional Risks: None ?Diabetes: No ? ?How often do you need to have someone help you when you read instructions, pamphlets, or other written materials from your doctor or pharmacy?: 1 - Never ? ?Diabetic?No ? ?  ? ?Information entered by :: Alaira Level ? ? ?Activities of Daily Living ? ?  04/12/2021  ?  8:30 AM  ?In your present state of health, do you have any difficulty performing the following activities:  ?Hearing? 1  ?Vision? 0  ?Difficulty concentrating or making decisions? 0  ?Walking or climbing stairs? 0  ?Dressing  or bathing? 0  ?Doing errands, shopping? 0  ?Preparing Food and eating ? N  ?Using the Toilet? N  ?In the past six months, have you accidently leaked urine? N  ?Do you have problems with loss of bowel control? N  ?Managing your Medications? N  ?Managing your Finances? N  ?Housekeeping or managing your Housekeeping? N  ? ? ?Patient Care Team: ?Carollee Herter, Alferd Apa, DO as PCP - General (Family Medicine) ? ?Indicate any recent Medical Services you may have received from other than [facility] providers in the past year (date may be approximate). ? ?   ?  Assessment:  ? This is a routine wellness examination for [place]. ? ?Hearing/Vision screen ?No results found. ? ?Dietary issues and exercise activities discussed: ?Current Exercise Habits: Home exercise routine, Type of exercise: walking;Other - see comments, Time (Minutes): 60, Frequency (Times/Week): 7, Weekly Exercise (Minutes/Week): 420, Intensity: Mild, Exercise limited by: None identified ? ? Goals Addressed   ? ?  ?  ?  ?  ? This Visit's Progress  ?  Patient Stated     ?  Maintain healthy lifestyle ?  ? ?  ? ?Depression Screen ? ?  04/12/2021  ?  8:28 AM 01/11/2021  ?  5:30 PM 03/26/2020  ?  8:23 AM 06/11/2018  ? 10:31 AM 08/30/2017  ?  9:15 AM 01/13/2016  ?  4:07 PM  ?PHQ 2/9 Scores  ?PHQ - 2 Score 0 0 '2 2 2 '$ 0  ?PHQ- 9 Score    7    ?  ?Fall Risk ? ?  04/12/2021  ?  8:28 AM 01/11/2021  ?  5:30 PM 03/26/2020  ?  8:36 AM  ?Fall Risk   ?Falls in the past year? 0 0 0  ?Number falls in past yr: 0 0 0  ?Injury with Fall? 0 0 0  ?Risk for fall due to : No Fall Risks No Fall Risks No Fall Risks  ?Follow up Falls evaluation completed Falls evaluation completed   ? ? ?FALL RISK PREVENTION PERTAINING TO THE HOME: ? ?Any stairs in or around the home? Yes  ?If so, are there any without handrails? No  ?Home free of loose throw rugs in walkways, pet beds, electrical cords, etc? Yes  ?Adequate lighting in your home to reduce risk of falls? Yes  ? ?ASSISTIVE DEVICES UTILIZED TO PREVENT FALLS: ? ?Life  alert? No  ?Use of a cane, walker or w/c? No  ?Grab bars in the bathroom? Yes ?Shower chair or bench in shower? No  ?Elevated toilet seat or a handicapped toilet? Yes  ? ?TIMED UP AND GO: ? ?Was the test

## 2021-04-12 NOTE — Patient Instructions (Signed)
Ms. Milling , ?Thank you for taking time to come for your Medicare Wellness Visit. I appreciate your ongoing commitment to your health goals. Please review the following plan we discussed and let me know if I can assist you in the future.  ? ?Screening recommendations/referrals: ?Colonoscopy: declined ?Mammogram: declined ?Bone Density: declined ?Recommended yearly ophthalmology/optometry visit for glaucoma screening and checkup ?Recommended yearly dental visit for hygiene and checkup ? ?Vaccinations: ?Influenza vaccine: declined ?Pneumococcal vaccine: declined ?Tdap vaccine: declined ?Shingles vaccine: declined   ?Covid-19:declined ? ?Advanced directives: no, packet sent ? ?Conditions/risks identified: see problem list ? ?Next appointment: Follow up in one year for your annual wellness visit  ? ? ?Preventive Care 67 Years and Older, Female ?Preventive care refers to lifestyle choices and visits with your health care provider that can promote health and wellness. ?What does preventive care include? ?A yearly physical exam. This is also called an annual well check. ?Dental exams once or twice a year. ?Routine eye exams. Ask your health care provider how often you should have your eyes checked. ?Personal lifestyle choices, including: ?Daily care of your teeth and gums. ?Regular physical activity. ?Eating a healthy diet. ?Avoiding tobacco and drug use. ?Limiting alcohol use. ?Practicing safe sex. ?Taking low-dose aspirin every day. ?Taking vitamin and mineral supplements as recommended by your health care provider. ?What happens during an annual well check? ?The services and screenings done by your health care provider during your annual well check will depend on your age, overall health, lifestyle risk factors, and family history of disease. ?Counseling  ?Your health care provider may ask you questions about your: ?Alcohol use. ?Tobacco use. ?Drug use. ?Emotional well-being. ?Home and relationship well-being. ?Sexual  activity. ?Eating habits. ?History of falls. ?Memory and ability to understand (cognition). ?Work and work Statistician. ?Reproductive health. ?Screening  ?You may have the following tests or measurements: ?Height, weight, and BMI. ?Blood pressure. ?Lipid and cholesterol levels. These may be checked every 5 years, or more frequently if you are over 86 years old. ?Skin check. ?Lung cancer screening. You may have this screening every year starting at age 79 if you have a 30-pack-year history of smoking and currently smoke or have quit within the past 15 years. ?Fecal occult blood test (FOBT) of the stool. You may have this test every year starting at age 28. ?Flexible sigmoidoscopy or colonoscopy. You may have a sigmoidoscopy every 5 years or a colonoscopy every 10 years starting at age 17. ?Hepatitis C blood test. ?Hepatitis B blood test. ?Sexually transmitted disease (STD) testing. ?Diabetes screening. This is done by checking your blood sugar (glucose) after you have not eaten for a while (fasting). You may have this done every 1-3 years. ?Bone density scan. This is done to screen for osteoporosis. You may have this done starting at age 72. ?Mammogram. This may be done every 1-2 years. Talk to your health care provider about how often you should have regular mammograms. ?Talk with your health care provider about your test results, treatment options, and if necessary, the need for more tests. ?Vaccines  ?Your health care provider may recommend certain vaccines, such as: ?Influenza vaccine. This is recommended every year. ?Tetanus, diphtheria, and acellular pertussis (Tdap, Td) vaccine. You may need a Td booster every 10 years. ?Zoster vaccine. You may need this after age 72. ?Pneumococcal 13-valent conjugate (PCV13) vaccine. One dose is recommended after age 23. ?Pneumococcal polysaccharide (PPSV23) vaccine. One dose is recommended after age 70. ?Talk to your health care provider about which  screenings and vaccines  you need and how often you need them. ?This information is not intended to replace advice given to you by your health care provider. Make sure you discuss any questions you have with your health care provider. ?Document Released: 01/22/2015 Document Revised: 09/15/2015 Document Reviewed: 10/27/2014 ?Elsevier Interactive Patient Education ? 2017 [place]. ? ?Fall Prevention in the Home ?Falls can cause injuries. They can happen to people of all ages. There are many things you can do to make your home safe and to help prevent falls. ?What can I do on the outside of my home? ?Regularly fix the edges of walkways and driveways and fix any cracks. ?Remove anything that might make you trip as you walk through a door, such as a raised step or threshold. ?Trim any bushes or trees on the path to your home. ?Use bright outdoor lighting. ?Clear any walking paths of anything that might make someone trip, such as rocks or tools. ?Regularly check to see if handrails are loose or broken. Make sure that both sides of any steps have handrails. ?Any raised decks and porches should have guardrails on the edges. ?Have any leaves, snow, or ice cleared regularly. ?Use sand or salt on walking paths during winter. ?Clean up any spills in your garage right away. This includes oil or grease spills. ?What can I do in the bathroom? ?Use night lights. ?Install grab bars by the toilet and in the tub and shower. Do not use towel bars as grab bars. ?Use non-skid mats or decals in the tub or shower. ?If you need to sit down in the shower, use a plastic, non-slip stool. ?Keep the floor dry. Clean up any water that spills on the floor as soon as it happens. ?Remove soap buildup in the tub or shower regularly. ?Attach bath mats securely with double-sided non-slip rug tape. ?Do not have throw rugs and other things on the floor that can make you trip. ?What can I do in the bedroom? ?Use night lights. ?Make sure that you have a light by your bed that  is easy to reach. ?Do not use any sheets or blankets that are too big for your bed. They should not hang down onto the floor. ?Have a firm chair that has side arms. You can use this for support while you get dressed. ?Do not have throw rugs and other things on the floor that can make you trip. ?What can I do in the kitchen? ?Clean up any spills right away. ?Avoid walking on wet floors. ?Keep items that you use a lot in easy-to-reach places. ?If you need to reach something above you, use a strong step stool that has a grab bar. ?Keep electrical cords out of the way. ?Do not use floor polish or wax that makes floors slippery. If you must use wax, use non-skid floor wax. ?Do not have throw rugs and other things on the floor that can make you trip. ?What can I do with my stairs? ?Do not leave any items on the stairs. ?Make sure that there are handrails on both sides of the stairs and use them. Fix handrails that are broken or loose. Make sure that handrails are as long as the stairways. ?Check any carpeting to make sure that it is firmly attached to the stairs. Fix any carpet that is loose or worn. ?Avoid having throw rugs at the top or bottom of the stairs. If you do have throw rugs, attach them to the floor with carpet  tape. ?Make sure that you have a light switch at the top of the stairs and the bottom of the stairs. If you do not have them, ask someone to add them for you. ?What else can I do to help prevent falls? ?Wear shoes that: ?Do not have high heels. ?Have rubber bottoms. ?Are comfortable and fit you well. ?Are closed at the toe. Do not wear sandals. ?If you use a stepladder: ?Make sure that it is fully opened. Do not climb a closed stepladder. ?Make sure that both sides of the stepladder are locked into place. ?Ask someone to hold it for you, if possible. ?Clearly mark and make sure that you can see: ?Any grab bars or handrails. ?First and last steps. ?Where the edge of each step is. ?Use tools that help you  move around (mobility aids) if they are needed. These include: ?Canes. ?Walkers. ?Scooters. ?Crutches. ?Turn on the lights when you go into a dark area. Replace any light bulbs as soon as they burn ou

## 2021-04-12 NOTE — Telephone Encounter (Signed)
Pt had an appointment with me today, she states that she would like a script for melatonin  '6mg'$  and valium 5 mg sent into pharmacy. She stated she only takes the valium as needed for when she has panic attacks. ?

## 2021-04-13 NOTE — Telephone Encounter (Signed)
LVM to let pt know that her medication was refilled and that shouldn't have the valium and trazadone. ?

## 2021-04-25 ENCOUNTER — Ambulatory Visit: Payer: Medicare HMO | Attending: Audiologist | Admitting: Audiologist

## 2021-04-25 DIAGNOSIS — H9193 Unspecified hearing loss, bilateral: Secondary | ICD-10-CM | POA: Diagnosis not present

## 2021-04-25 NOTE — Procedures (Signed)
?  [facility] ?[address] ?[place], [place]  [address] ?[phone] ? ?AUDIOLOGICAL  EVALUATION ? ?NAME: Theresa [name]     ?DOB:   04/15/1955      ?MRN: [id number]                                                                                     ?DATE: 04/25/2021     ?REFERENT: Ann Held, DO ?STATUS: Outpatient ?DIAGNOSIS: Decreased Hearing  ? ?History: ?Theresa [name] was seen for an audiological evaluation.  ?Theresa [name] is receiving a hearing evaluation due to concerns for difficulty hearing. Theresa [name] has difficulty hearing in background noise, crowds, and when people are at a distance. This difficulty began gradually. No pain or pressure reported in either ear. Tinnitus present in both ears when she has a migraine. She says the hardest person for her to understand is her husband. She has jaw tension from arthritis. She has pain in her ears when flying that goes away once her ears pop. Medical history positive for history of chemotherapy which is a risk factor for hearing loss. No other relevant case history reported.  ? ?Evaluation:  ?Otoscopy showed a clear view of the tympanic membranes, bilaterally ?Tympanometry results were consistent with normal middle ear function, bilaterally   ?Audiometric testing was completed using conventional audiometry with supraural transducer. Speech Recognition Thresholds were consistent with pure tone averages. Word Recognition was excellent at conversation level. Pure tone thresholds show normal hearing in both ears.  ?QuickSIN was 4.5dB SNR showing mild SNR loss in noise. Test results are consistent with good hearing with a mild loss when in background noise.  ? ?Results:  ?The test results were reviewed with Theresa [name]. She has normal hearing in each ear. Speech in  noise testing showing some mild difficulty hearing in background noise. Theresa [name] reported understanding. All questions were answered.   ? ?Recommendations: ?1.   Recommend monitoring  hearing with testing every other year. No need for further testing at this time.  ? ?Alfonse Alpers  ?Audiologist, Au.D., CCC-A ?04/25/2021  10:19 AM ? ?Cc: Ann Held, DO  ?

## 2021-04-26 ENCOUNTER — Other Ambulatory Visit: Payer: Self-pay | Admitting: Family Medicine

## 2021-04-26 ENCOUNTER — Encounter: Payer: Self-pay | Admitting: Family Medicine

## 2021-04-26 DIAGNOSIS — R41840 Attention and concentration deficit: Secondary | ICD-10-CM

## 2021-04-26 MED ORDER — LISDEXAMFETAMINE DIMESYLATE 60 MG PO CAPS: 60.0000 mg | ORAL_CAPSULE | ORAL | 0 refills | Status: DC

## 2021-04-29 ENCOUNTER — Telehealth: Payer: Self-pay | Admitting: Family Medicine

## 2021-04-29 NOTE — Telephone Encounter (Signed)
Rep from [facility] called on pt's behalf to obtain a supporting statement/prior authorization in order to fill her prescription for Vyvanse. Pt would like to be called to inform them of when the document is sent. Please Advise. ? ?Medication:  ? ?lisdexamfetamine (VYVANSE) 60 MG capsule [[id number]]  ? ?Preferred Pharmacy (with phone number or street name):  ? ?[id number] [place] [place] - [address]  ?[id number], [place] [address] ?Phone:  [phone] Fax:  [phone] ? ? ? ?

## 2021-05-03 ENCOUNTER — Encounter: Payer: Self-pay | Admitting: Family Medicine

## 2021-05-03 NOTE — Telephone Encounter (Signed)
PA initiated via Covermymeds; KEY: [id number]. PA cancelled by plan.  ? ?Information regarding your request ?There is an existing case within the [facility] environment that has the same patient, prescriber, and drug. This case must be finalized before proceeding with similar requests. ?

## 2021-05-13 ENCOUNTER — Encounter: Payer: Self-pay | Admitting: Family Medicine

## 2021-06-07 IMAGING — RF CERVICAL SPINE - 2-3 VIEW
1 series · 2 of 2 positions shown · non-contrast
Comparison: 03/21/2018

CLINICAL DATA: Anterior cervical decompression and fusion, C4-5,
C5-6, C6-7 with instrumentation allograft.

EXAM:
DG C-ARM 61-120 MIN; CERVICAL SPINE - 2-3 VIEW

[Series 1: run · 2 of 2 slices shown]
[im 1/2]
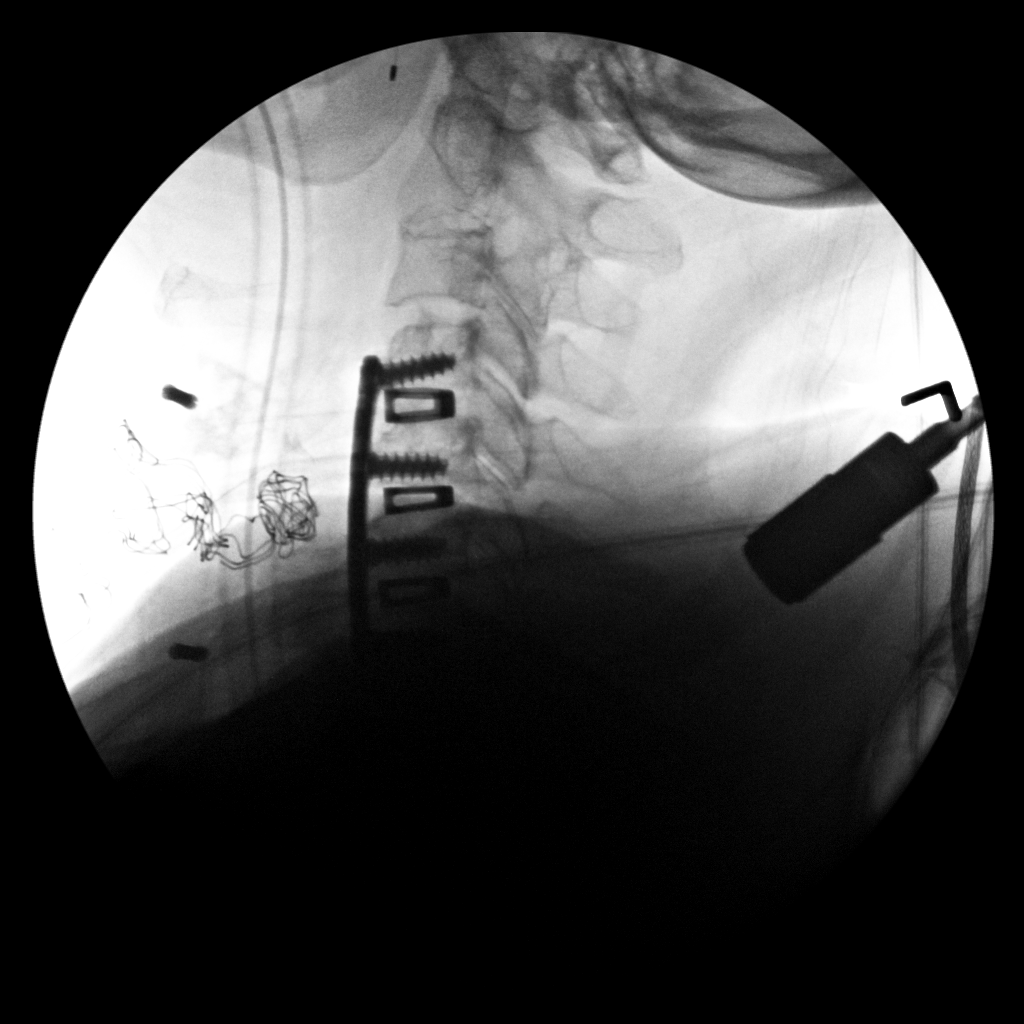
[im 2/2]
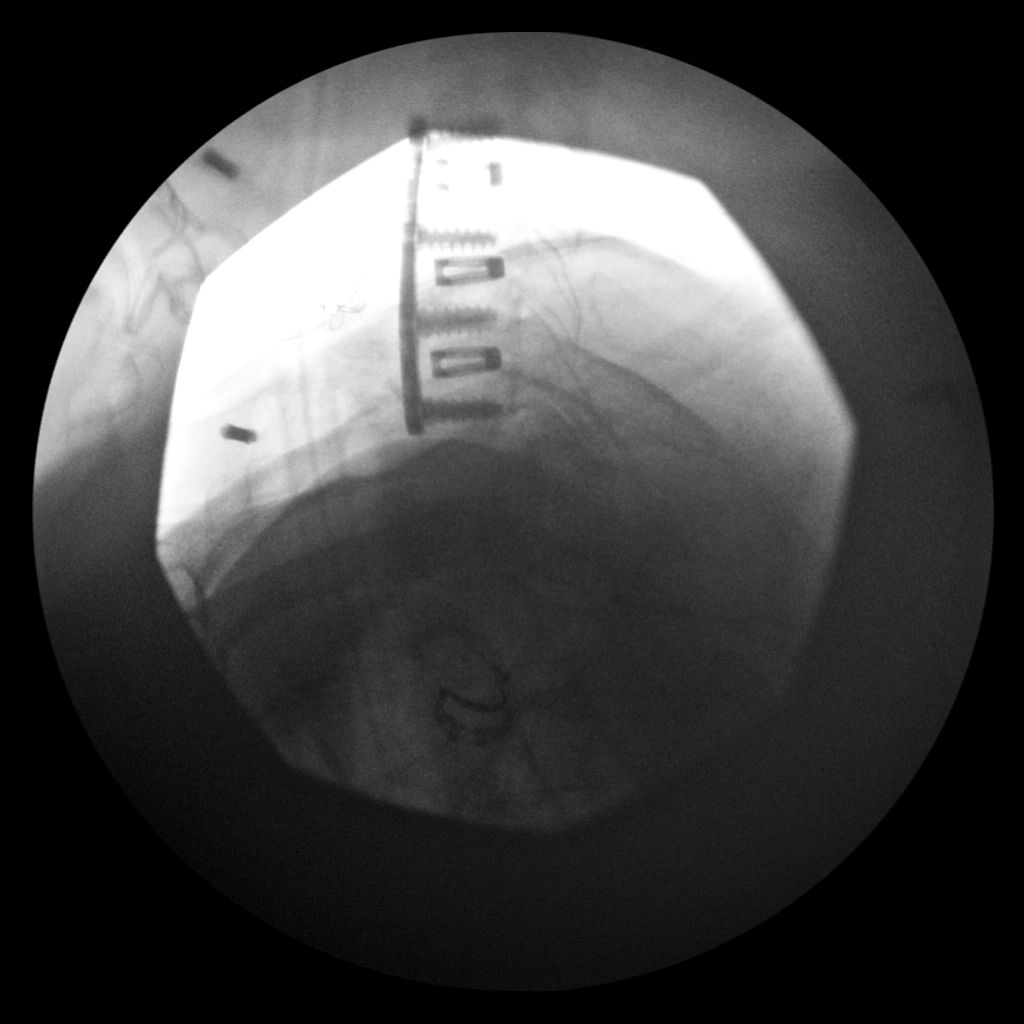

[2 of 2 positions shown; findings below may reference images not displayed]

FINDINGS: Patient has undergone anterior fusion from C4 through C7 . Detail
below C7 is limited. There are interbody fusion devices C4-5, C5-6,
C6-7.
IMPRESSION: Status post anterior fusion C4-C7.

## 2021-06-08 ENCOUNTER — Encounter: Payer: Self-pay | Admitting: Family Medicine

## 2021-06-08 DIAGNOSIS — R41840 Attention and concentration deficit: Secondary | ICD-10-CM

## 2021-06-08 NOTE — Telephone Encounter (Signed)
Requesting: Vyvanse '60mg'$  Contract: 03/26/20 UDS: 03/26/20 Last Visit: 01/11/21 Next Visit: None Last Refill: 04/26/21 #30 and 0RF  Please Advise

## 2021-06-09 MED ORDER — LISDEXAMFETAMINE DIMESYLATE 60 MG PO CAPS: 60.0000 mg | ORAL_CAPSULE | ORAL | 0 refills | Status: DC

## 2021-06-10 ENCOUNTER — Telehealth: Payer: Self-pay | Admitting: Family Medicine

## 2021-06-10 NOTE — Telephone Encounter (Signed)
Noted  

## 2021-06-10 NOTE — Telephone Encounter (Signed)
Pt came in th office for forms to be completed. Forms placed in provider's box up front. She would like papers faxed to [phone].

## 2021-06-10 NOTE — Telephone Encounter (Signed)
Pt would also like a copy made once everything is filled out.

## 2021-06-29 ENCOUNTER — Encounter: Payer: Self-pay | Admitting: Family Medicine

## 2021-06-29 ENCOUNTER — Other Ambulatory Visit: Payer: Self-pay | Admitting: Family Medicine

## 2021-06-29 DIAGNOSIS — J302 Other seasonal allergic rhinitis: Secondary | ICD-10-CM

## 2021-06-29 DIAGNOSIS — R41840 Attention and concentration deficit: Secondary | ICD-10-CM

## 2021-07-03 ENCOUNTER — Other Ambulatory Visit: Payer: Self-pay | Admitting: Family Medicine

## 2021-07-03 DIAGNOSIS — F418 Other specified anxiety disorders: Secondary | ICD-10-CM

## 2021-07-14 ENCOUNTER — Other Ambulatory Visit: Payer: Self-pay | Admitting: Family Medicine

## 2021-07-14 DIAGNOSIS — R41840 Attention and concentration deficit: Secondary | ICD-10-CM

## 2021-07-14 MED ORDER — LISDEXAMFETAMINE DIMESYLATE 60 MG PO CAPS: 60.0000 mg | ORAL_CAPSULE | ORAL | 0 refills | Status: DC

## 2021-07-14 NOTE — Telephone Encounter (Signed)
Patient is calling to get an update on the forms needing to be faxed to [facility]. She states she has been waiting for those forms to be faxed since the beginning of June and when she called [facility] today, they stated they have not received the forms. Patient states she only has two days worth of medicine left and cannot spend the $95 they are trying to charge her. She wants a call back today to discuss what she can do since she will run out of her medicine. Please advise.

## 2021-07-14 NOTE — Telephone Encounter (Signed)
Spoke with patient. Pt advised that forms have been faxed numerous times and that we have been having moments of our systems being down. Forms have been faxed twice today. Spoke with Theresa [name] and they haven't received the forms yet. I will check in the morning

## 2021-07-15 NOTE — Telephone Encounter (Signed)
Called [employer] this morning and they stated they haven't received the forms yet. The representative gave me an email to try. [email or web address]-----Forms have been emailed

## 2021-07-25 ENCOUNTER — Telehealth: Payer: Self-pay | Admitting: Family Medicine

## 2021-07-25 DIAGNOSIS — R41840 Attention and concentration deficit: Secondary | ICD-10-CM

## 2021-07-25 NOTE — Telephone Encounter (Signed)
[facility], it looks like the 07/16 rx was sent to print. Please resend.   [facility]  [address], [place] [place]

## 2021-07-25 NOTE — Telephone Encounter (Signed)
Spoke with representative at [facility].  Representative states application was approved on 07/18/21 through the end of the year.  Patient is to check personal email for next steps.   Spoke with patient, advised of status and to check email including junk mail.  Patient appreciative of call.

## 2021-07-26 ENCOUNTER — Other Ambulatory Visit: Payer: Self-pay | Admitting: Family Medicine

## 2021-07-26 DIAGNOSIS — G4700 Insomnia, unspecified: Secondary | ICD-10-CM

## 2021-07-26 DIAGNOSIS — R41840 Attention and concentration deficit: Secondary | ICD-10-CM

## 2021-07-26 MED ORDER — LISDEXAMFETAMINE DIMESYLATE 60 MG PO CAPS: 60.0000 mg | ORAL_CAPSULE | ORAL | 0 refills | Status: DC

## 2021-07-26 NOTE — Telephone Encounter (Signed)
Left detailed message on machine that rx was sent in.

## 2021-07-27 ENCOUNTER — Encounter: Payer: Self-pay | Admitting: Family Medicine

## 2021-07-27 DIAGNOSIS — R41840 Attention and concentration deficit: Secondary | ICD-10-CM

## 2021-07-27 MED ORDER — LISDEXAMFETAMINE DIMESYLATE 60 MG PO CAPS: 60.0000 mg | ORAL_CAPSULE | ORAL | 0 refills | Status: DC

## 2021-07-27 NOTE — Telephone Encounter (Signed)
[facility] is out of stock on this medication and patient has been out for a few days. She found a pharmacy that has it which is [facility].  Can we resend there?  Dr Larose Kells will you be able to help in Dr. Ivy Lynn absence?  We have forwarded to her as well.

## 2021-07-27 NOTE — Telephone Encounter (Signed)
Please advise, pt is very frustrated.

## 2021-07-27 NOTE — Telephone Encounter (Signed)
Prescription sent

## 2021-08-04 DIAGNOSIS — M7661 Achilles tendinitis, right leg: Secondary | ICD-10-CM | POA: Diagnosis not present

## 2021-08-22 ENCOUNTER — Encounter: Payer: Self-pay | Admitting: Family Medicine

## 2021-08-22 DIAGNOSIS — R41840 Attention and concentration deficit: Secondary | ICD-10-CM

## 2021-08-22 MED ORDER — LISDEXAMFETAMINE DIMESYLATE 60 MG PO CAPS: 60.0000 mg | ORAL_CAPSULE | ORAL | 0 refills | Status: DC

## 2021-08-22 NOTE — Telephone Encounter (Signed)
Requesting: Vyvanse Contract: 03/26/2020 UDS: 03/26/2020 Last OV: 01/11/2021 virtual Next OV: N/A Last Refill: 07/27/2021, #90--0 RF Database:   Please advise

## 2021-08-29 ENCOUNTER — Encounter: Payer: Self-pay | Admitting: Family Medicine

## 2021-08-29 ENCOUNTER — Telehealth: Payer: Self-pay

## 2021-08-29 ENCOUNTER — Other Ambulatory Visit: Payer: Self-pay | Admitting: Family Medicine

## 2021-08-29 DIAGNOSIS — R41840 Attention and concentration deficit: Secondary | ICD-10-CM

## 2021-08-29 MED ORDER — LISDEXAMFETAMINE DIMESYLATE 60 MG PO CAPS: 60.0000 mg | ORAL_CAPSULE | ORAL | 0 refills | Status: DC

## 2021-08-29 NOTE — Telephone Encounter (Signed)
Roma Schanz- MD Contact Type Call Who Is Calling Patient / Member / Family / Caregiver Call Type Triage / Clinical Relationship To Patient Self Return Phone Number [phone] (Primary) Chief Complaint Prescription Refill or Medication Request (non symptomatic) Reason for Call Medication Question / Request Initial Comment Caller states she sent a message to Dr. Jason Nest on 8/14 stating she needs a medication to be refilled. Pharmacy has not received the medication and she is currently out of medicine. Caller denies any symptoms at this moment but states she is not supposed to stop her medication. Translation No Nurse Assessment Nurse: Redmond Pulling, RN, Levada Dy Date/Time Eilene Ghazi Time): 08/27/2021 8:47:07 AM Please select the assessment type ---Request for controlled medication refill Additional Documentation ---Caller states she sent a message to Dr. Carollee Herter on 8/14 stating she needs a medication(Vyvance) to be refilled. Pharmacy has not received the medication and she is currently out of medicine. Caller denies any symptoms at this moment but states she is not supposed to stop her medication. Advised that a controlled drug cannot be called in after hours. Patient voiced understanding, advised patient to call us back if any new or worsening symptoms. Is there an on-call physician for the client? ---Yes Do the client directives specifically allow for paging the on-call regarding scheduled drugs? ---No Additional Documentation ---N/A Disp. Time Eilene Ghazi Time) Disposition Final User 08/27/2021 8:51:49 AM Clinical Call Yes Redmond Pulling, RN, Levada Dy Final Disposition 08/27/2021 8:51:49 AM Clinical Call Yes Redmond Pulling, RN, Levada Dy

## 2021-08-29 NOTE — Telephone Encounter (Signed)
Pt called stating she wanted to have her [facility]se [facility] to the following [facility] due to issues with her getting hold of it month-to-month:  [facility] [address] [place], [place] [address] P: [phone]  Pt would also like to look into options to avoid the month-to-month hassle of getting her meds. If there is a place where the office can send it to ensure it gets filled or if there is a supply issue, look into a written Rx so she can take it to get I refilled.

## 2021-08-29 NOTE — Telephone Encounter (Signed)
Medication was resent today

## 2021-08-30 NOTE — Telephone Encounter (Signed)
Pt made aware yesterday via Mychart that Rx has been sent

## 2021-08-31 DIAGNOSIS — S20211A Contusion of right front wall of thorax, initial encounter: Secondary | ICD-10-CM | POA: Diagnosis not present

## 2021-09-01 DIAGNOSIS — S299XXA Unspecified injury of thorax, initial encounter: Secondary | ICD-10-CM | POA: Diagnosis not present

## 2021-09-01 DIAGNOSIS — S199XXA Unspecified injury of neck, initial encounter: Secondary | ICD-10-CM | POA: Diagnosis not present

## 2021-09-01 DIAGNOSIS — S20211A Contusion of right front wall of thorax, initial encounter: Secondary | ICD-10-CM | POA: Diagnosis not present

## 2021-09-01 DIAGNOSIS — Z79899 Other long term (current) drug therapy: Secondary | ICD-10-CM | POA: Diagnosis not present

## 2021-09-21 ENCOUNTER — Other Ambulatory Visit: Payer: Self-pay

## 2021-09-21 ENCOUNTER — Encounter: Payer: Self-pay | Admitting: Family Medicine

## 2021-09-21 DIAGNOSIS — R41840 Attention and concentration deficit: Secondary | ICD-10-CM

## 2021-09-21 DIAGNOSIS — K2970 Gastritis, unspecified, without bleeding: Secondary | ICD-10-CM

## 2021-09-21 DIAGNOSIS — G4700 Insomnia, unspecified: Secondary | ICD-10-CM

## 2021-09-21 MED ORDER — DIAZEPAM 5 MG PO TABS: ORAL_TABLET | ORAL | 1 refills | Status: DC

## 2021-09-21 MED ORDER — LISDEXAMFETAMINE DIMESYLATE 60 MG PO CAPS: 60.0000 mg | ORAL_CAPSULE | ORAL | 0 refills | Status: DC

## 2021-09-21 MED ORDER — PANTOPRAZOLE SODIUM 40 MG PO TBEC: 40.0000 mg | DELAYED_RELEASE_TABLET | Freq: Every day | ORAL | 1 refills | Status: DC

## 2021-09-21 NOTE — Telephone Encounter (Signed)
Requesting: Vyvanse '60mg'$   Contract:03/26/20 UDS:03/26/20 Last Visit: 01/11/21 Next Visit: None  Last Refill: 08/29/21 #90 and 0RF (Pt only received 30 capsules)  She is requesting refill early because pharmacy has been having trouble getting it in on time.

## 2021-09-21 NOTE — Telephone Encounter (Signed)
Requesting: diazepam '5mg'$   Contract:03/26/20 UDS:03/26/20 Last Visit: 01/11/21 Next Visit: None  Last Refill: 04/12/21 #30 and 1RF   Please Advise

## 2021-10-04 ENCOUNTER — Encounter: Payer: Self-pay | Admitting: Family Medicine

## 2021-10-10 ENCOUNTER — Encounter: Payer: Self-pay | Admitting: Family Medicine

## 2021-10-10 ENCOUNTER — Telehealth (INDEPENDENT_AMBULATORY_CARE_PROVIDER_SITE_OTHER): Payer: Medicare HMO | Admitting: Family Medicine

## 2021-10-10 DIAGNOSIS — B351 Tinea unguium: Secondary | ICD-10-CM

## 2021-10-10 MED ORDER — TERBINAFINE HCL 250 MG PO TABS: 250.0000 mg | ORAL_TABLET | Freq: Every day | ORAL | 0 refills | Status: DC

## 2021-10-10 NOTE — Assessment & Plan Note (Addendum)
lamasil --- check cmp in 6 weeks   Chemistry      Component Value Date/Time   NA 142 03/26/2020 0858   K 4.3 03/26/2020 0858   CL 104 03/26/2020 0858   CO2 28 03/26/2020 0858   BUN 18 03/26/2020 0858   CREATININE 0.81 03/26/2020 0858   CREATININE 0.75 09/22/2019 0846      Component Value Date/Time   CALCIUM 9.6 03/26/2020 0858   ALKPHOS 58 03/26/2020 0858   AST 16 03/26/2020 0858   ALT 18 03/26/2020 0858   BILITOT 0.3 03/26/2020 0858

## 2021-10-10 NOTE — Progress Notes (Addendum)
MyChart Video Visit    Virtual Visit via Video Note   This visit type was conducted due to national recommendations for restrictions regarding the COVID-19 Pandemic (e.g. social distancing) in an effort to limit this patient's exposure and mitigate transmission in our community. This patient is at least at moderate risk for complications without adequate follow up. This format is felt to be most appropriate for this patient at this time. Physical exam was limited by quality of the video and audio technology used for the visit. Alinda Dooms was able to get the patient set up on a video visit.  Patient location: Home Patient and provider in visit Provider location: Office  I discussed the limitations of evaluation and management by telemedicine and the availability of in person appointments. The patient expressed understanding and agreed to proceed.  Visit Date: 10/10/2021  Today's healthcare provider: Ann Held, [name]     Subjective:    Patient ID: Theresa [name], female    DOB: 1955/06/05, 66 y.o.   MRN: [id number]  No chief complaint on file.   HPI Patient is in today for virtual office visit.  She complains of a toe fungus with all the toes on both feet. She states that symptoms have appeared for awhile now. Symptoms appear as having a yellow/white coloration. She has tried OTC productions with nothing alleviating symptoms. She denies of any pain.   She reports that she is having trouble getting her Vyvanse medication.   Past Medical History:  Diagnosis Date   ADD (attention deficit disorder)    Anemia    Arthritis    neck   Breast cancer ([place])    Left breast   Complication of anesthesia    1 time "I felt everything" - approx 2005, - or "maybe 1986 or 2010- not sure"   Constipation    probotic    Depression    Diverticulitis    Family history of adverse reaction to anesthesia    other  and PG- wake up during surgey   Fibromyalgia    GERD  (gastroesophageal reflux disease)    occ   History of blood transfusion    History of stomach ulcers    Hypertension    "white coat syndrome"   Migraines    migraine since age 46    Pleurisy    Pneumonia    age 71   PONV (postoperative nausea and vomiting)    Urinary incontinence     Past Surgical History:  Procedure Laterality Date   ANTERIOR CERVICAL DECOMPRESSION/DISCECTOMY FUSION 4 LEVELS N/A 08/01/2018   Procedure: ANTERIOR CERVICAL DECOMPRESSION FUSION, CERVICAL FOUR TO FIVE, CERVICAL FIVE TO SIX , CERVICAL SIX TO SEVEN WITH INSTRUMENTATION AND ALLOGRAFT;  Surgeon: Phylliss Bob, MD;  Location: [facility];  Service: Orthopedics;  Laterality: N/A;   APPENDECTOMY  1960   BREAST BIOPSY Left 1985   BREAST RECONSTRUCTION  1986 and 2010   x's 2   CHOLECYSTECTOMY  1987   COLONOSCOPY     LASIK  2012   MASTECTOMY Left 1985    Family History  Problem Relation Age of Onset   Breast cancer Mother    Depression Mother    Heart failure Father    Depression Father    Arthritis Maternal Grandmother     Social History   Socioeconomic History   Marital status: Married    Spouse name: Not on file   Number of children: Not on file   Years of  education: Not on file   Highest education level: Not on file  Occupational History   Occupation: Dealer: [employer]  Tobacco Use   Smoking status: Never   Smokeless tobacco: Never  Vaping Use   Vaping Use: Never used  Substance and Sexual Activity   Alcohol use: Yes    Comment: occasionally   Drug use: No   Sexual activity: Yes  Other Topics Concern   Not on file  Social History Narrative   Not on file   Social Determinants of Health   Financial Resource Strain: Low Risk  (04/12/2021)   Overall Financial Resource Strain (CARDIA)    Difficulty of Paying Living Expenses: Not hard at all  Food Insecurity: No Food Insecurity (04/12/2021)   Hunger Vital Sign    Worried About Running Out of Food in the  Last Year: Never true    [place] in the Last Year: Never true  Transportation Needs: No Transportation Needs (04/12/2021)   PRAPARE - Hydrologist (Medical): No    Lack of Transportation (Non-Medical): No  Physical Activity: Not on file  Stress: No Stress Concern Present (04/12/2021)   [place]    Feeling of Stress : Not at all  Social Connections: [place] (04/12/2021)   Social Connection and Isolation Panel [NHANES]    Frequency of Communication with Friends and Family: More than three times a week    Frequency of Social Gatherings with Friends and Family: More than three times a week    Attends Religious Services: More than 4 times per year    Active Member of [employer] or Organizations: Yes    Attends Music therapist: More than 4 times per year    Marital Status: Married  Human resources officer Violence: Not At Risk (04/12/2021)   Humiliation, Afraid, Rape, and Kick questionnaire    Fear of Current or Ex-Partner: No    Emotionally Abused: No    Physically Abused: No    Sexually Abused: No    Outpatient Medications Prior to Visit  Medication Sig Dispense Refill   buPROPion (WELLBUTRIN XL) 300 MG 24 hr tablet TAKE 1 TABLET EVERY DAY 90 tablet 1   cyclobenzaprine (FLEXERIL) 10 MG tablet TAKE 1 TABLET (10 MG TOTAL) BY MOUTH EVERY 8  HOURS AS NEEDED. 90 tablet 0   diazepam (VALIUM) 5 MG tablet 1 po qhs prn 30 tablet 1   levocetirizine (XYZAL) 5 MG tablet TAKE 1 TABLET EVERY DAY IN THE EVENING 90 tablet 1   lisdexamfetamine (VYVANSE) 60 MG capsule Take 1 capsule (60 mg total) by mouth every morning. 90 capsule 0   Melatonin-GABA-Valerian 07-08-48 MG CAPS 1 po qhs prn 30 capsule 11   pantoprazole (PROTONIX) 40 MG tablet Take 1 tablet (40 mg total) by mouth daily. 90 tablet 1   No facility-administered medications prior to visit.    Allergies  Allergen Reactions    Nitroglycerin     Migraine   Oxycodone Itching    Review of Systems  Constitutional:  Negative for fever and malaise/fatigue.  HENT:  Negative for congestion.   Eyes:  Negative for blurred vision.  Respiratory:  Negative for shortness of breath.   Cardiovascular:  Negative for chest pain, palpitations and leg swelling.  Gastrointestinal:  Negative for abdominal pain, blood in stool and nausea.  Genitourinary:  Negative for dysuria and frequency.  Musculoskeletal:  Negative  for falls.  Skin:  Negative for rash.       (+) White - Yellow Discoloration on Toe Nails(Both Feet) (-) Pain  Neurological:  Negative for dizziness, loss of consciousness and headaches.  Endo/Heme/Allergies:  Negative for environmental allergies.  Psychiatric/Behavioral:  Negative for depression. The patient is not nervous/anxious.        Objective:    Physical Exam Vitals and nursing note reviewed.  Skin:    Comments: Big toenail b/l ---  yellow and flaky  No pain with palpation Slightly thickened     There were no vitals taken for this visit. Wt Readings from Last 3 Encounters:  01/11/21 195 lb (88.5 kg)  04/16/20 194 lb 3.2 oz (88.1 kg)  03/26/20 193 lb 9.6 oz (87.8 kg)    Diabetic Foot Exam - Simple   No data filed    Lab Results  Component Value Date   WBC 4.2 03/26/2020   HGB 13.1 03/26/2020   HCT 39.8 03/26/2020   PLT 269.0 03/26/2020   GLUCOSE 88 03/26/2020   CHOL 184 03/26/2020   TRIG 106.0 03/26/2020   HDL 71.40 03/26/2020   LDLCALC 92 03/26/2020   ALT 18 03/26/2020   AST 16 03/26/2020   NA 142 03/26/2020   K 4.3 03/26/2020   CL 104 03/26/2020   CREATININE 0.81 03/26/2020   BUN 18 03/26/2020   CO2 28 03/26/2020   TSH 1.50 09/28/2014   INR 0.9 07/29/2018    Lab Results  Component Value Date   TSH 1.50 09/28/2014   Lab Results  Component Value Date   WBC 4.2 03/26/2020   HGB 13.1 03/26/2020   HCT 39.8 03/26/2020   MCV 81.7 03/26/2020   PLT 269.0 03/26/2020    Lab Results  Component Value Date   NA 142 03/26/2020   K 4.3 03/26/2020   CO2 28 03/26/2020   GLUCOSE 88 03/26/2020   BUN 18 03/26/2020   CREATININE 0.81 03/26/2020   BILITOT 0.3 03/26/2020   ALKPHOS 58 03/26/2020   AST 16 03/26/2020   ALT 18 03/26/2020   PROT 6.7 03/26/2020   ALBUMIN 4.1 03/26/2020   CALCIUM 9.6 03/26/2020   ANIONGAP 11 03/13/2019   GFR 75.88 03/26/2020   Lab Results  Component Value Date   CHOL 184 03/26/2020   Lab Results  Component Value Date   HDL 71.40 03/26/2020   Lab Results  Component Value Date   LDLCALC 92 03/26/2020   Lab Results  Component Value Date   TRIG 106.0 03/26/2020   Lab Results  Component Value Date   CHOLHDL 3 03/26/2020   No results found for: "HGBA1C"     Assessment & Plan:   Problem List Items Addressed This Visit       Unprioritized   Onychomycosis - Primary    lamasil --- check cmp in 6 weeks   Chemistry      Component Value Date/Time   NA 142 03/26/2020 0858   K 4.3 03/26/2020 0858   CL 104 03/26/2020 0858   CO2 28 03/26/2020 0858   BUN 18 03/26/2020 0858   CREATININE 0.81 03/26/2020 0858   CREATININE 0.75 09/22/2019 0846      Component Value Date/Time   CALCIUM 9.6 03/26/2020 0858   ALKPHOS 58 03/26/2020 0858   AST 16 03/26/2020 0858   ALT 18 03/26/2020 0858   BILITOT 0.3 03/26/2020 0858           Relevant Medications   terbinafine (LAMISIL) 250 MG tablet  Other Relevant Orders   Comprehensive metabolic panel   Meds ordered this encounter  Medications   terbinafine (LAMISIL) 250 MG tablet    Sig: Take 1 tablet (250 mg total) by mouth daily.    Dispense:  90 tablet    Refill:  0    I discussed the assessment and treatment plan with the patient. The patient was provided an opportunity to ask questions and all were answered. The patient agreed with the plan and demonstrated an understanding of the instructions.   The patient was advised to call back or seek an in-person evaluation  if the symptoms worsen or if the condition fails to improve as anticipated.  I provided 20 minutes of face-to-face time during this encounter.   I,Amber Collins,acting as a Education administrator for [employer], [name].,have documented all relevant documentation on the behalf of Ann Held, [name],as directed by  Ann Held, [name] while in the presence of Ann Held, [name].    Ann Held, [name] [place] at [employer] [phone] (phone) [phone] (fax)  [place]

## 2021-10-11 ENCOUNTER — Telehealth: Payer: Self-pay | Admitting: Family Medicine

## 2021-10-11 NOTE — Telephone Encounter (Signed)
-----   Message from Ann Held, DO sent at 10/10/2021 11:57 AM EDT ----- Needs a labs app in 6 weeks

## 2021-10-11 NOTE — Telephone Encounter (Signed)
Lvm for patient to get scheduled in 6 weeks

## 2021-10-31 ENCOUNTER — Encounter: Payer: Self-pay | Admitting: Family Medicine

## 2021-10-31 DIAGNOSIS — R41840 Attention and concentration deficit: Secondary | ICD-10-CM

## 2021-10-31 NOTE — Telephone Encounter (Signed)
Requesting: Vyvanse '60mg'$   Contract:03/26/20 UDS:03/26/20 Last Visit:10/10/21 Next Visit: None Last Refill: 09/21/21 #90 and 0RF   Please Advise

## 2021-11-01 ENCOUNTER — Other Ambulatory Visit: Payer: Self-pay | Admitting: Family Medicine

## 2021-11-01 MED ORDER — LISDEXAMFETAMINE DIMESYLATE 60 MG PO CAPS: 60.0000 mg | ORAL_CAPSULE | ORAL | 0 refills | Status: DC

## 2021-11-01 NOTE — Telephone Encounter (Signed)
Patient and her husband called stating Theresa [name] has the medication and they don't want them to run out. They would like the medication sent to the pharmacy ASAP. They would also like a call when the medication gets sent to the pharmacy. Please advise.

## 2021-11-10 ENCOUNTER — Encounter: Payer: Self-pay | Admitting: Family Medicine

## 2021-11-10 DIAGNOSIS — G4700 Insomnia, unspecified: Secondary | ICD-10-CM

## 2021-11-10 MED ORDER — DIAZEPAM 5 MG PO TABS: ORAL_TABLET | ORAL | 1 refills | Status: DC

## 2021-11-10 NOTE — Telephone Encounter (Signed)
Requesting: Valium Contract: 2022 UDS: 2022 Last OV: 10/30/2021--v/v Next OV: 12/05/2021 Last Refill: 09/21/2021, #30--1 RF Database:   Please advise

## 2021-11-28 ENCOUNTER — Other Ambulatory Visit: Payer: Self-pay | Admitting: Family Medicine

## 2021-11-28 DIAGNOSIS — R41840 Attention and concentration deficit: Secondary | ICD-10-CM

## 2021-11-28 MED ORDER — LISDEXAMFETAMINE DIMESYLATE 60 MG PO CAPS: 60.0000 mg | ORAL_CAPSULE | ORAL | 0 refills | Status: DC

## 2021-11-28 NOTE — Telephone Encounter (Signed)
Requesting: Vyvanse Contract: 03/26/2020 UDS: 03/26/2020 Last OV: 10/10/2021 Next OV: N/A Last Refill: 11/01/2021, #90--0 RF Database:   Please advise

## 2021-11-28 NOTE — Telephone Encounter (Signed)
Medication:  lisdexamfetamine (VYVANSE) 60 MG capsule  3 month supply Has the patient contacted their pharmacy? No.  Preferred Pharmacy (with phone number or street name):   [id number] [place], [place] - [id number]: [phone] Fax: [phone]

## 2021-11-29 ENCOUNTER — Other Ambulatory Visit: Payer: Self-pay | Admitting: Family Medicine

## 2021-11-29 DIAGNOSIS — R41840 Attention and concentration deficit: Secondary | ICD-10-CM

## 2021-11-29 MED ORDER — LISDEXAMFETAMINE DIMESYLATE 60 MG PO CAPS: 60.0000 mg | ORAL_CAPSULE | ORAL | 0 refills | Status: DC

## 2021-11-29 NOTE — Telephone Encounter (Signed)
Pt's husband stated they wanted a 30 day supply sent to a local pharmacy instead. He would like to know if we could send in 3 separate 30 day supplies so they don't have to call every month. Pt will be out of rx tomorrow.    Medication: lisdexamfetamine (VYVANSE) 60 MG capsule  Has the patient contacted their pharmacy? Yes.     [facility]  [place] [place] [place] [address]

## 2021-12-05 ENCOUNTER — Ambulatory Visit: Payer: Medicare HMO | Admitting: Family Medicine

## 2021-12-13 DIAGNOSIS — H5203 Hypermetropia, bilateral: Secondary | ICD-10-CM | POA: Diagnosis not present

## 2021-12-13 DIAGNOSIS — H524 Presbyopia: Secondary | ICD-10-CM | POA: Diagnosis not present

## 2021-12-20 ENCOUNTER — Ambulatory Visit: Payer: Medicare HMO | Admitting: Family Medicine

## 2021-12-20 ENCOUNTER — Other Ambulatory Visit (INDEPENDENT_AMBULATORY_CARE_PROVIDER_SITE_OTHER): Payer: Medicare HMO

## 2021-12-20 DIAGNOSIS — B351 Tinea unguium: Secondary | ICD-10-CM | POA: Diagnosis not present

## 2021-12-20 LAB — COMPREHENSIVE METABOLIC PANEL
ALT: 14 U/L (ref 0–35)
AST: 15 U/L (ref 0–37)
Albumin: 4.4 g/dL (ref 3.5–5.2)
Alkaline Phosphatase: 54 U/L (ref 39–117)
BUN: 19 mg/dL (ref 6–23)
CO2: 29 mEq/L (ref 19–32)
Calcium: 9.6 mg/dL (ref 8.4–10.5)
Chloride: 105 mEq/L (ref 96–112)
Creatinine, Ser: 0.89 mg/dL (ref 0.40–1.20)
GFR: 66.95 mL/min (ref >60.00)
Glucose, Bld: 101 mg/dL — ABNORMAL HIGH (ref 70–99)
Potassium: 4.3 mEq/L (ref 3.5–5.1)
Sodium: 142 mEq/L (ref 135–145)
Total Bilirubin: 0.4 mg/dL (ref 0.2–1.2)
Total Protein: 7 g/dL (ref 6.0–8.3)

## 2022-01-02 ENCOUNTER — Encounter: Payer: Self-pay | Admitting: Family Medicine

## 2022-01-02 DIAGNOSIS — K2970 Gastritis, unspecified, without bleeding: Secondary | ICD-10-CM

## 2022-01-03 MED ORDER — PANTOPRAZOLE SODIUM 40 MG PO TBEC: 40.0000 mg | DELAYED_RELEASE_TABLET | Freq: Every day | ORAL | 1 refills | Status: DC

## 2022-01-25 ENCOUNTER — Encounter: Payer: Self-pay | Admitting: Family Medicine

## 2022-01-25 DIAGNOSIS — F418 Other specified anxiety disorders: Secondary | ICD-10-CM

## 2022-01-25 MED ORDER — BUPROPION HCL ER (XL) 300 MG PO TB24: 300.0000 mg | ORAL_TABLET | Freq: Every day | ORAL | 0 refills | Status: DC

## 2022-01-27 ENCOUNTER — Encounter: Payer: Self-pay | Admitting: Family Medicine

## 2022-01-27 ENCOUNTER — Ambulatory Visit (INDEPENDENT_AMBULATORY_CARE_PROVIDER_SITE_OTHER): Payer: Medicare HMO | Admitting: Family Medicine

## 2022-01-27 VITALS — BP 124/86 | HR 83 | Temp 97.5°F | Resp 18 | Ht 64.0 in | Wt 188.4 lb

## 2022-01-27 DIAGNOSIS — L0291 Cutaneous abscess, unspecified: Secondary | ICD-10-CM | POA: Diagnosis not present

## 2022-01-27 MED ORDER — DOXYCYCLINE HYCLATE 100 MG PO TABS: 100.0000 mg | ORAL_TABLET | Freq: Two times a day (BID) | ORAL | 0 refills | Status: DC

## 2022-01-27 NOTE — Progress Notes (Signed)
Subjective:   By signing my name below, I, Daiva Huge, attest that this documentation has been prepared under the direction and in the presence of Ann Held, [name] 01/27/22   Patient ID: Theresa [name], female    DOB: [birth year], 67 y.o.   MRN: [id number]  No chief complaint on file.   HPI Patient is in today for an office visit.  Abscess: She has an abscess on her right-lateral abdomen with surrounding erythema and yellow discharge. It was drained a few years ago but it has returned and is weepy and painful.  Notes increased reddening of the abscess and an irregular odor. The pain wasn't as severe when she had it before.    Past Medical History:  Diagnosis Date  . ADD (attention deficit disorder)   . Anemia   . Arthritis    neck  . Breast cancer ([place])    Left breast  . Complication of anesthesia    1 time "I felt everything" - approx 2005, - or "maybe 1986 or 2010- not sure"  . Constipation    probotic   . Depression   . Diverticulitis   . Family history of adverse reaction to anesthesia    other  and PG- wake up during surgey  . Fibromyalgia   . GERD (gastroesophageal reflux disease)    occ  . History of blood transfusion   . History of stomach ulcers   . Hypertension    "white coat syndrome"  . Migraines    migraine since age 18   . Pleurisy   . Pneumonia    age 57  . PONV (postoperative nausea and vomiting)   . Urinary incontinence     Past Surgical History:  Procedure Laterality Date  . ANTERIOR CERVICAL DECOMPRESSION/DISCECTOMY FUSION 4 LEVELS N/A 08/01/2018   Procedure: ANTERIOR CERVICAL DECOMPRESSION FUSION, CERVICAL FOUR TO FIVE, CERVICAL FIVE TO SIX , CERVICAL SIX TO SEVEN WITH INSTRUMENTATION AND ALLOGRAFT;  Surgeon: Phylliss Bob, MD;  Location: [facility];  Service: Orthopedics;  Laterality: N/A;  . APPENDECTOMY  1960  . BREAST BIOPSY Left 1985  . BREAST RECONSTRUCTION  1986 and 2010   x's 2  . CHOLECYSTECTOMY  1987  . COLONOSCOPY     . LASIK  2012  . MASTECTOMY Left 1985    Family History  Problem Relation Age of Onset  . Breast cancer Mother   . Depression Mother   . Heart failure Father   . Depression Father   . Arthritis Maternal Grandmother     Social History   Socioeconomic History  . Marital status: Married    Spouse name: Not on file  . Number of children: Not on file  . Years of education: Not on file  . Highest education level: Not on file  Occupational History  . Occupation: Dealer: [employer]  Tobacco Use  . Smoking status: Never  . Smokeless tobacco: Never  Vaping Use  . Vaping Use: Never used  Substance and Sexual Activity  . Alcohol use: Yes    Comment: occasionally  . Drug use: No  . Sexual activity: Yes  Other Topics Concern  . Not on file  Social History Narrative  . Not on file   Social Determinants of Health   Financial Resource Strain: Low Risk  (04/12/2021)   Overall Financial Resource Strain (CARDIA)   . Difficulty of Paying Living Expenses: Not hard at all  Food Insecurity: No Food Insecurity (  04/12/2021)   Hunger Vital Sign   . Worried About Charity fundraiser in the Last Year: Never true   . Ran Out of Food in the Last Year: Never true  Transportation Needs: No Transportation Needs (04/12/2021)   PRAPARE - Transportation   . Lack of Transportation (Medical): No   . Lack of Transportation (Non-Medical): No  Physical Activity: Not on file  Stress: No Stress Concern Present (04/12/2021)   [place]   . Feeling of Stress : Not at all  Social Connections: Socially Integrated (04/12/2021)   Social Connection and Isolation Panel [NHANES]   . Frequency of Communication with Friends and Family: More than three times a week   . Frequency of Social Gatherings with Friends and Family: More than three times a week   . Attends Religious Services: More than 4 times per year   .  Active Member of Clubs or Organizations: Yes   . Attends Archivist Meetings: More than 4 times per year   . Marital Status: Married  Human resources officer Violence: Not At Risk (04/12/2021)   Humiliation, Afraid, Rape, and Kick questionnaire   . Fear of Current or Ex-Partner: No   . Emotionally Abused: No   . Physically Abused: No   . Sexually Abused: No    Outpatient Medications Prior to Visit  Medication Sig Dispense Refill  . buPROPion (WELLBUTRIN XL) 300 MG 24 hr tablet Take 1 tablet (300 mg total) by mouth daily. 90 tablet 0  . cyclobenzaprine (FLEXERIL) 10 MG tablet TAKE 1 TABLET (10 MG TOTAL) BY MOUTH EVERY 8  HOURS AS NEEDED. 90 tablet 0  . diazepam (VALIUM) 5 MG tablet 1 po qhs prn 30 tablet 1  . levocetirizine (XYZAL) 5 MG tablet TAKE 1 TABLET EVERY DAY IN THE EVENING 90 tablet 1  . lisdexamfetamine (VYVANSE) 60 MG capsule Take 1 capsule (60 mg total) by mouth every morning. 90 capsule 0  . Melatonin-GABA-Valerian 07-08-48 MG CAPS 1 po qhs prn 30 capsule 11  . pantoprazole (PROTONIX) 40 MG tablet Take 1 tablet (40 mg total) by mouth daily. 90 tablet 1  . terbinafine (LAMISIL) 250 MG tablet Take 1 tablet (250 mg total) by mouth daily. 90 tablet 0   No facility-administered medications prior to visit.    Allergies  Allergen Reactions  . Nitroglycerin     Migraine  . Oxycodone Itching    Review of Systems  Constitutional:  Negative for fever and malaise/fatigue.  HENT:  Negative for congestion.   Eyes:  Negative for blurred vision.  Respiratory:  Negative for cough and shortness of breath.   Cardiovascular:  Negative for chest pain, palpitations and leg swelling.  Gastrointestinal:  Negative for vomiting.  Musculoskeletal:  Negative for back pain.  Skin:  Negative for rash.       (+) Abscess on right-lateral abdomen  Neurological:  Negative for loss of consciousness and headaches.       Objective:    Physical Exam Constitutional:      General: She is not in  acute distress.    Appearance: Normal appearance. She is not ill-appearing.  HENT:     Head: Normocephalic and atraumatic.     Right Ear: External ear normal.     Left Ear: External ear normal.  Eyes:     Extraocular Movements: Extraocular movements intact.     Pupils: Pupils are equal, round, and reactive to light.  Cardiovascular:  Rate and Rhythm: Normal rate and regular rhythm.     Heart sounds: Normal heart sounds. No murmur heard.    No gallop.  Pulmonary:     Effort: Pulmonary effort is normal. No respiratory distress.     Breath sounds: Normal breath sounds. No wheezing or rales.  Skin:    General: Skin is warm and dry.     Findings: Abscess (Right-lateral abdomen), erythema (Surrounding the abscess) and lesion present.     Comments: Abscess on right-lateral abdomen with surrounding erythema and yellow discharge. Culture collected.  Neurological:     Mental Status: She is alert and oriented to person, place, and time.  Psychiatric:        Judgment: Judgment normal.    There were no vitals taken for this visit. Wt Readings from Last 3 Encounters:  01/11/21 195 lb (88.5 kg)  04/16/20 194 lb 3.2 oz (88.1 kg)  03/26/20 193 lb 9.6 oz (87.8 kg)       Assessment & Plan:  There are no diagnoses linked to this encounter.   I,Alexander Ruley,acting as a Education administrator for [employer], [name].,have documented all relevant documentation on the behalf of Ann Held, [name],as directed by  Ann Held, [name] while in the presence of Ann Held, [name].   I, Daiva Huge, personally preformed the services described in this documentation.  All medical record entries made by the scribe were at my direction and in my presence.  I have reviewed the chart and discharge instructions (if applicable) and agree that the record reflects my personal performance and is accurate and complete. 01/27/22   Daiva Huge

## 2022-01-31 DIAGNOSIS — M25562 Pain in left knee: Secondary | ICD-10-CM | POA: Diagnosis not present

## 2022-02-01 LAB — WOUND CULTURE
MICRO NUMBER:: 14448253
SPECIMEN QUALITY:: ADEQUATE

## 2022-02-04 ENCOUNTER — Other Ambulatory Visit: Payer: Self-pay | Admitting: Family Medicine

## 2022-02-04 DIAGNOSIS — J302 Other seasonal allergic rhinitis: Secondary | ICD-10-CM

## 2022-03-04 IMAGING — CT CT CERVICAL SPINE W/O CM
2 series · 10 of 14 positions shown, 12 images · non-contrast
Comparison: Intraoperative radiographs 08/01/2018 and MRI
03/29/2018.

CLINICAL DATA: Neck pain post ACDF 08/01/2018. Evaluate
pseudoarthrosis.

EXAM:
CT CERVICAL SPINE WITHOUT CONTRAST
TECHNIQUE: Multidetector CT imaging of the cervical spine was performed without
intravenous contrast. Multiplanar CT image reconstructions were also
generated.

[Series 3: cspine soft · axial · 0.29mm/px · z∈[-284,-166]mm · 5 of 89 slices shown]
[im 15/89  soft-tissue]
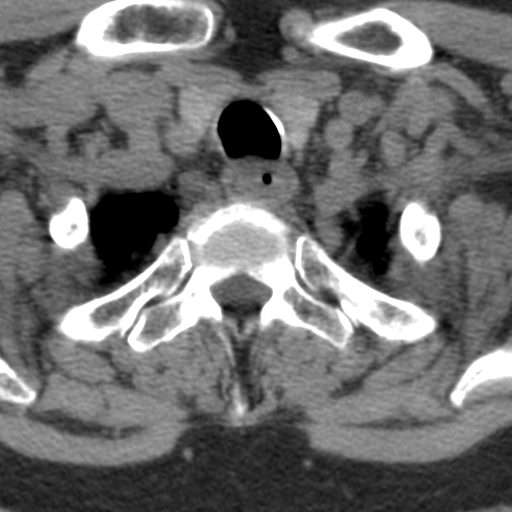
[im 30/89  soft-tissue]
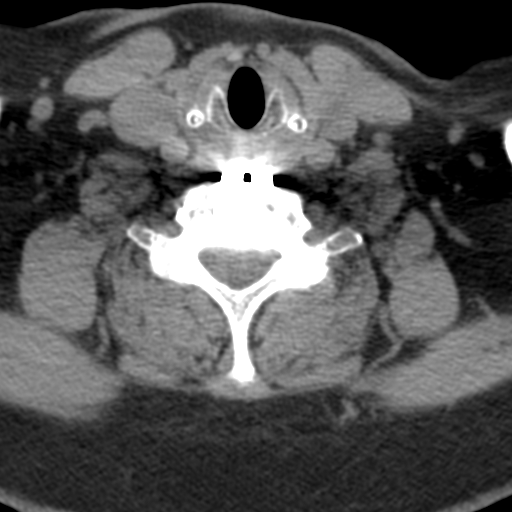
[im 45/89  soft-tissue]
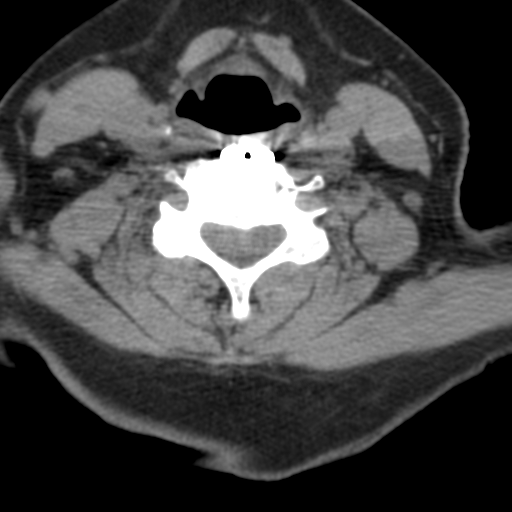
[im 59/89  soft-tissue]
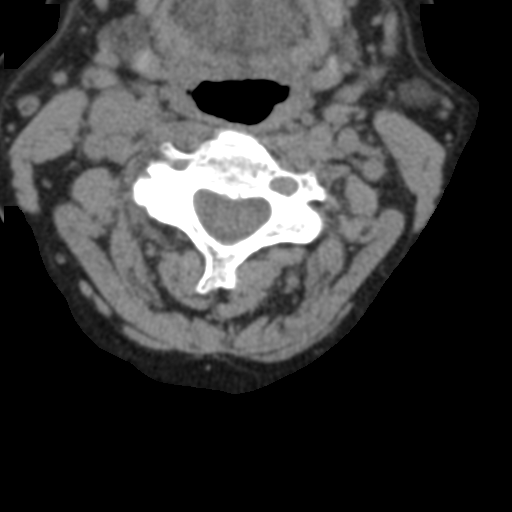
[im 74/89  soft-tissue]
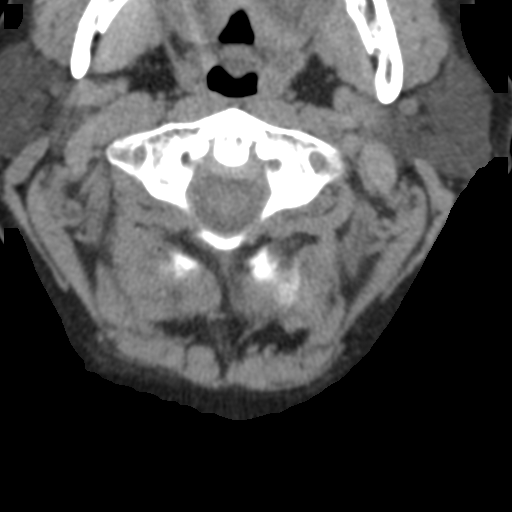

[Series 9: angled axial · axial · 0.29mm/px · z∈[-297,-179]mm · 5 of 90 slices shown, 7 images]
[im 15/90  soft-tissue]
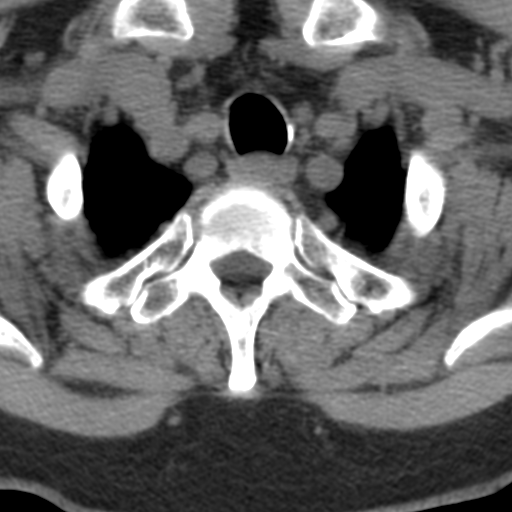
[im 15/90  bone]
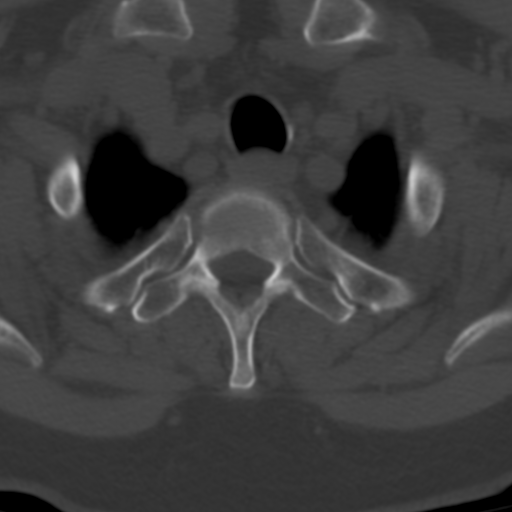
[im 30/90  bone]
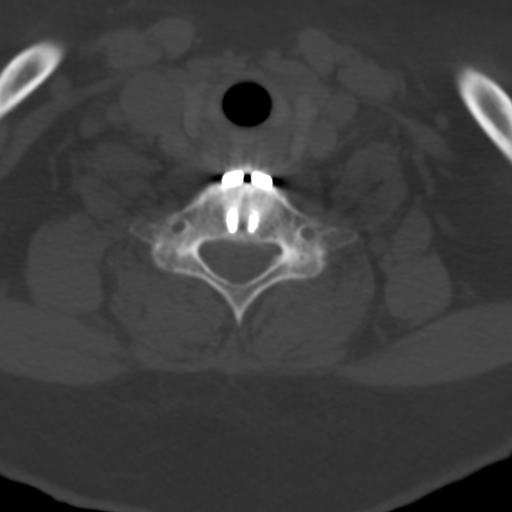
[im 45/90  bone]
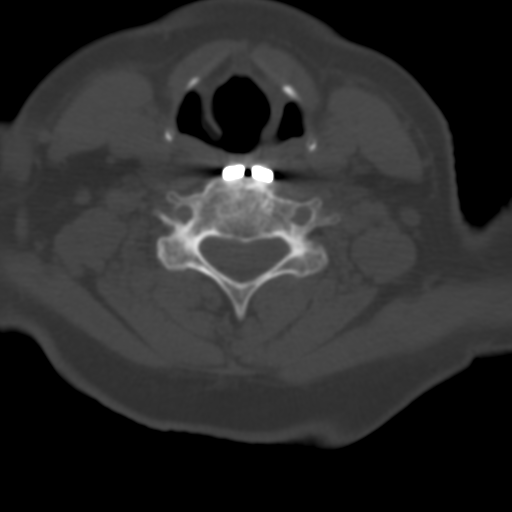
[im 60/90  bone]
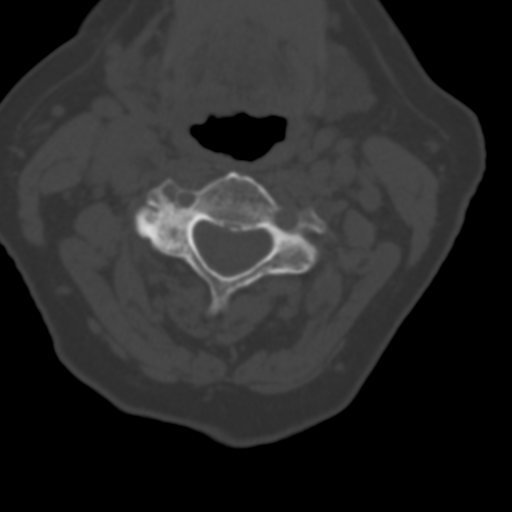
[im 75/90  soft-tissue]
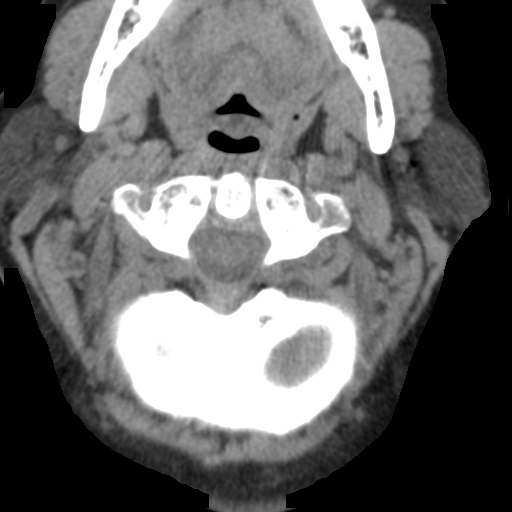
[im 75/90  bone]
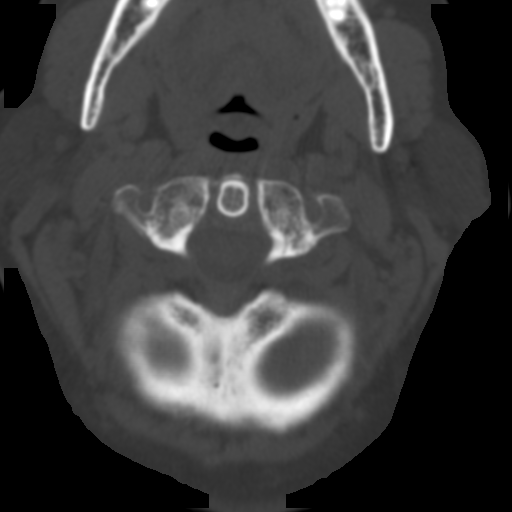

[10 of 14 positions shown; findings below may reference images not displayed]

FINDINGS: Alignment: Straightening with a slight anterolisthesis at C3-4.

Skull base and vertebrae: Interval anterior discectomy and fusion
from C4 through C7 using an anterior plate, screws and interbody
spacers. There is evidence for solid interbody fusion at each level,
although evaluation is mildly limited by artifact. There is no
hardware loosening or displacement. There is ankylosis of the right
C4-5 facet joint. No evidence of acute fracture. Probable
developmental incomplete segmentation at C2-3.

Soft tissues and spinal canal: No prevertebral fluid or swelling. No
visible canal hematoma.

Disc levels: C2-3: Probable congenital incomplete segmentation. No
significant spinal stenosis or nerve root encroachment.

C3-4: Loss of disc height with asymmetric uncinate spurring and
facet hypertrophy on the right. Moderate right foraminal narrowing,
similar to previous MRI. The left foramen is patent.

Mild residual foraminal narrowing at the C4-5, C5-6 and C6-7 levels
post ACDF.

C7-T1: Mild bilateral facet hypertrophy. Stable left greater than
right foraminal narrowing due to osteophytes.

Upper chest: Unremarkable.

Other: None.
IMPRESSION: 1. Interval anterior discectomy and fusion from C4 through C7. There
is evidence for solid interbody fusion at each level, and the right
C4-5 facet joint appears ankylosed. No evidence of hardware
displacement or pseudoarthrosis.
2. Stable moderate right foraminal narrowing at C3-4 and moderate
left foraminal narrowing at C7-T1.
3. Mild residual foraminal narrowing at the C4-5, C5-6 and C6-7
levels.

## 2022-03-07 ENCOUNTER — Other Ambulatory Visit: Payer: Self-pay | Admitting: Family Medicine

## 2022-03-07 ENCOUNTER — Encounter: Payer: Self-pay | Admitting: Family Medicine

## 2022-03-07 DIAGNOSIS — G4700 Insomnia, unspecified: Secondary | ICD-10-CM

## 2022-03-07 MED ORDER — DIAZEPAM 5 MG PO TABS: ORAL_TABLET | ORAL | 1 refills | Status: DC

## 2022-03-07 NOTE — Telephone Encounter (Signed)
Requesting: diazepam '5mg'$   Contract:03/26/20 UDS: 03/26/20 Last Visit: 01/27/22 Next Visit: None Last Refill: 11/10/21 #30 and 1RF  Please Advise

## 2022-03-31 ENCOUNTER — Encounter: Payer: Self-pay | Admitting: Family Medicine

## 2022-03-31 DIAGNOSIS — R41840 Attention and concentration deficit: Secondary | ICD-10-CM

## 2022-04-10 ENCOUNTER — Other Ambulatory Visit: Payer: Self-pay | Admitting: Family Medicine

## 2022-04-10 ENCOUNTER — Telehealth: Payer: Self-pay | Admitting: Family Medicine

## 2022-04-10 DIAGNOSIS — R41840 Attention and concentration deficit: Secondary | ICD-10-CM

## 2022-04-10 MED ORDER — LISDEXAMFETAMINE DIMESYLATE 60 MG PO CAPS: 60.0000 mg | ORAL_CAPSULE | ORAL | 0 refills | Status: DC

## 2022-04-10 NOTE — Telephone Encounter (Signed)
RX must state "No Generic substitution"

## 2022-04-10 NOTE — Telephone Encounter (Signed)
Requesting: Vyvanse 60mg   Contract: 03/26/20 UDS: 03/26/20 Last Visit: 01/27/22 Next Visit: None Last Refill: 11/29/21 #90 and 0RF   Please Advise

## 2022-04-10 NOTE — Telephone Encounter (Signed)
Theresa [name] (spouse DPR OK) called to follow up on medication status. He also stated the medication needed to be written for 30 days instead of 90.

## 2022-04-10 NOTE — Telephone Encounter (Signed)
Patient's husband called stating he needs the medication sent immediately to the pharmacy because the pt has been without it for 3 days. Informed patient that we received the message Friday, and since we were closed the refill is getting taken care of today as soon as the provider has a chance to send it. They [facility].

## 2022-04-10 NOTE — Telephone Encounter (Signed)
Copied from [place] [id number]. Topic: Medicare AWV >> Apr 10, 2022 11:16 AM Devoria Glassing wrote: Reason for CRM: Called patient to schedule Medicare Annual Wellness Visit (AWV). Left message for patient to call back and schedule Medicare Annual Wellness Visit (AWV).  Last date of AWV: 04/12/21  Please schedule an appointment at any time with Beatris Ship, [place]  .  If any questions, please contact me.  Thank you ,  Sherol Dade; [place] Direct Dial: [phone]

## 2022-04-11 ENCOUNTER — Other Ambulatory Visit: Payer: Self-pay | Admitting: Family Medicine

## 2022-04-11 DIAGNOSIS — F419 Anxiety disorder, unspecified: Secondary | ICD-10-CM

## 2022-04-25 ENCOUNTER — Other Ambulatory Visit (HOSPITAL_COMMUNITY): Payer: Self-pay

## 2022-05-16 DIAGNOSIS — M25562 Pain in left knee: Secondary | ICD-10-CM | POA: Diagnosis not present

## 2022-05-19 ENCOUNTER — Encounter: Payer: Self-pay | Admitting: Family Medicine

## 2022-05-19 DIAGNOSIS — R41840 Attention and concentration deficit: Secondary | ICD-10-CM

## 2022-05-19 DIAGNOSIS — G4700 Insomnia, unspecified: Secondary | ICD-10-CM

## 2022-05-19 MED ORDER — DIAZEPAM 5 MG PO TABS: ORAL_TABLET | ORAL | 1 refills | Status: DC

## 2022-05-19 MED ORDER — LISDEXAMFETAMINE DIMESYLATE 60 MG PO CAPS: 60.0000 mg | ORAL_CAPSULE | ORAL | 0 refills | Status: DC

## 2022-05-19 NOTE — Telephone Encounter (Signed)
Requesting: Valium Contract: 2022 UDS: 2022 Last OV: 01/27/22 Next OV: N/A Last Refill: 03/07/22, #30--1 RF Database:  Requesting: Vyvanse Contract: UDS: Last OV: Next OV: Last Refill: 04/10/22, #30--0 RF Database:   Please advise

## 2022-05-23 DIAGNOSIS — S83242A Other tear of medial meniscus, current injury, left knee, initial encounter: Secondary | ICD-10-CM | POA: Diagnosis not present

## 2022-06-09 ENCOUNTER — Other Ambulatory Visit: Payer: Self-pay | Admitting: Orthopedic Surgery

## 2022-06-11 NOTE — H&P (Signed)
A pre op hand p   Chief Complaint: left knee pain  HPI: Theresa [name] is a 67 y.o. female who presents for evaluation of left knee pain. It has been present for greater than 3 months and has been worsening. She has failed conservative measures. Pain is rated as moderate.  Past Medical History:  Diagnosis Date   ADD (attention deficit disorder)    Anemia    Arthritis    neck   Breast cancer (HCC)    Left breast   Complication of anesthesia    1 time "I felt everything" - approx 2005, - or "maybe 1986 or 2010- not sure"   Constipation    probotic    Depression    Diverticulitis    Family history of adverse reaction to anesthesia    other  and PG- wake up during surgey   Fibromyalgia    GERD (gastroesophageal reflux disease)    occ   History of blood transfusion    History of stomach ulcers    Hypertension    "white coat syndrome"   Migraines    migraine since age 56    Pleurisy    Pneumonia    age 4   PONV (postoperative nausea and vomiting)    Urinary incontinence    Past Surgical History:  Procedure Laterality Date   ANTERIOR CERVICAL DECOMPRESSION/DISCECTOMY FUSION 4 LEVELS N/A 08/01/2018   Procedure: ANTERIOR CERVICAL DECOMPRESSION FUSION, CERVICAL FOUR TO FIVE, CERVICAL FIVE TO SIX , CERVICAL SIX TO SEVEN WITH INSTRUMENTATION AND ALLOGRAFT;  Surgeon: Estill Bamberg, MD;  Location: [facility] OR;  Service: Orthopedics;  Laterality: N/A;   APPENDECTOMY  1960   BREAST BIOPSY Left 1985   BREAST RECONSTRUCTION  1986 and 2010   x's 2   CHOLECYSTECTOMY  1987   COLONOSCOPY     LASIK  2012   MASTECTOMY Left 1985   Social History   Socioeconomic History   Marital status: Married    Spouse name: Not on file   Number of children: Not on file   Years of education: Not on file   Highest education level: Not on file  Occupational History   Occupation: Psychologist, counselling    Employer: [employer]  Tobacco Use   Smoking status: Never   Smokeless tobacco: Never   Vaping Use   Vaping Use: Never used  Substance and Sexual Activity   Alcohol use: Yes    Comment: occasionally   Drug use: No   Sexual activity: Yes  Other Topics Concern   Not on file  Social History Narrative   Not on file   Social Determinants of Health   Financial Resource Strain: Low Risk  (04/12/2021)   Overall Financial Resource Strain (CARDIA)    Difficulty of Paying Living Expenses: Not hard at all  Food Insecurity: No Food Insecurity (04/12/2021)   Hunger Vital Sign    Worried About Running Out of Food in the Last Year: Never true    Ran Out of Food in the Last Year: Never true  Transportation Needs: No Transportation Needs (04/12/2021)   PRAPARE - Administrator, Civil Service (Medical): No    Lack of Transportation (Non-Medical): No  Physical Activity: Not on file  Stress: No Stress Concern Present (04/12/2021)   [employer] of Occupational Health - Occupational Stress Questionnaire    Feeling of Stress : Not at all  Social Connections: Socially Integrated (04/12/2021)   Social Connection and Isolation Panel [NHANES]  Frequency of Communication with Friends and Family: More than three times a week    Frequency of Social Gatherings with Friends and Family: More than three times a week    Attends Religious Services: More than 4 times per year    Active Member of [employer] or Organizations: Yes    Attends Engineer, structural: More than 4 times per year    Marital Status: Married   Family History  Problem Relation Age of Onset   Breast cancer Mother    Depression Mother    Heart failure Father    Depression Father    Arthritis Maternal Grandmother    Allergies  Allergen Reactions   Nitroglycerin     Migraine   Oxycodone Itching   Prior to Admission medications   Medication Sig Start Date End Date Taking? Authorizing Provider  buPROPion (WELLBUTRIN XL) 300 MG 24 hr tablet Take 1 tablet (300 mg total) by mouth daily. 01/25/22   Donato Schultz, DO  diazepam (VALIUM) 5 MG tablet 1 po qhs prn 05/19/22   Zola Button, Grayling Congress, DO  doxycycline (VIBRA-TABS) 100 MG tablet Take 1 tablet (100 mg total) by mouth 2 (two) times daily. 01/27/22   Donato Schultz, DO  levocetirizine (XYZAL) 5 MG tablet TAKE 1 TABLET EVERY DAY IN THE EVENING 02/06/22   Zola Button, Grayling Congress, DO  lisdexamfetamine (VYVANSE) 60 MG capsule Take 1 capsule (60 mg total) by mouth every morning. 05/19/22   Donato Schultz, DO  Melatonin-GABA-Valerian 07-08-48 MG CAPS 1 po qhs prn 04/12/21   Zola Button, Grayling Congress, DO  pantoprazole (PROTONIX) 40 MG tablet Take 1 tablet (40 mg total) by mouth daily. 01/03/22   Seabron Spates R, DO     Positive ROS: none  All other systems have been reviewed and were otherwise negative with the exception of those mentioned in the HPI and as above.  Physical Exam: There were no vitals filed for this visit.  General: Alert, no acute distress Cardiovascular: No pedal edema Respiratory: No cyanosis, no use of accessory musculature GI: No organomegaly, abdomen is soft and non-tender Skin: No lesions in the area of chief complaint Neurologic: Sensation intact distally Psychiatric: Patient is competent for consent with normal mood and affect Lymphatic: No axillary or cervical lymphadenopathy  MUSCULOSKELETAL: L knee: painful rom.  +med amd lat jt line tenderness.  No instability  Assessment/Plan: MEDIAL and lateral MENISCAL TEARs LEFT KNEE Plan for Procedure(s): ARTHROSCOPY KNEE  The risks benefits and alternatives were discussed with the patient including but not limited to the risks of nonoperative treatment, versus surgical intervention including infection, bleeding, nerve injury, malunion, nonunion, hardware prominence, hardware failure, need for hardware removal, blood clots, cardiopulmonary complications, morbidity, mortality, among others, and they were willing to proceed.  Predicted outcome is good, although  there will be at least a six to nine month expected recovery.  [name], MD 06/11/2022 11:12 PM

## 2022-06-12 ENCOUNTER — Other Ambulatory Visit: Payer: Self-pay

## 2022-06-12 ENCOUNTER — Encounter (HOSPITAL_BASED_OUTPATIENT_CLINIC_OR_DEPARTMENT_OTHER): Payer: Self-pay | Admitting: Orthopedic Surgery

## 2022-06-13 ENCOUNTER — Encounter (HOSPITAL_BASED_OUTPATIENT_CLINIC_OR_DEPARTMENT_OTHER)
Admission: RE | Admit: 2022-06-13 | Discharge: 2022-06-13 | Disposition: A | Discharge status: Home / Self Care | Payer: Medicare HMO | Source: Ambulatory Visit | Attending: Orthopedic Surgery | Admitting: Orthopedic Surgery

## 2022-06-13 DIAGNOSIS — Z01810 Encounter for preprocedural cardiovascular examination: Secondary | ICD-10-CM | POA: Insufficient documentation

## 2022-06-13 NOTE — Progress Notes (Signed)

## 2022-06-14 NOTE — H&P (Signed)
A pre op hand p   Chief Complaint: left knee pain  HPI: Theresa [name] is a 67 y.o. female who presents for evaluation of left knee pain. It has been present for greater than 3 months and has been worsening. She has failed conservative measures. Pain is rated as moderate.  Past Medical History:  Diagnosis Date   ADD (attention deficit disorder)    Anemia    Arthritis    neck   Breast cancer (HCC)    Left breast   Complication of anesthesia    1 time "I felt everything" - approx 2005, - or "maybe 1986 or 2010- not sure"   Constipation    probotic    Depression    Diverticulitis    Family history of adverse reaction to anesthesia    other  and PG- wake up during surgey   Fibromyalgia    GERD (gastroesophageal reflux disease)    occ   History of blood transfusion    History of stomach ulcers    Hypertension    "white coat syndrome"   Migraines    migraine since age 54    Pleurisy    Pneumonia    age 65   PONV (postoperative nausea and vomiting)    Urinary incontinence    Past Surgical History:  Procedure Laterality Date   ANTERIOR CERVICAL DECOMPRESSION/DISCECTOMY FUSION 4 LEVELS N/A 08/01/2018   Procedure: ANTERIOR CERVICAL DECOMPRESSION FUSION, CERVICAL FOUR TO FIVE, CERVICAL FIVE TO SIX , CERVICAL SIX TO SEVEN WITH INSTRUMENTATION AND ALLOGRAFT;  Surgeon: Estill Bamberg, MD;  Location: [facility] OR;  Service: Orthopedics;  Laterality: N/A;   APPENDECTOMY  1960   BREAST BIOPSY Left 1985   BREAST RECONSTRUCTION  1986 and 2010   x's 2   CHOLECYSTECTOMY  1987   COLONOSCOPY     LASIK  2012   MASTECTOMY Left 1985   Social History   Socioeconomic History   Marital status: Married    Spouse name: Not on file   Number of children: Not on file   Years of education: Not on file   Highest education level: Not on file  Occupational History   Occupation: Psychologist, counselling    Employer: [employer]  Tobacco Use   Smoking status: Never   Smokeless tobacco: Never   Vaping Use   Vaping Use: Never used  Substance and Sexual Activity   Alcohol use: Yes    Comment: occasionally   Drug use: No   Sexual activity: Yes  Other Topics Concern   Not on file  Social History Narrative   Not on file   Social Determinants of Health   Financial Resource Strain: Low Risk  (04/12/2021)   Overall Financial Resource Strain (CARDIA)    Difficulty of Paying Living Expenses: Not hard at all  Food Insecurity: No Food Insecurity (04/12/2021)   Hunger Vital Sign    Worried About Running Out of Food in the Last Year: Never true    Ran Out of Food in the Last Year: Never true  Transportation Needs: No Transportation Needs (04/12/2021)   PRAPARE - Administrator, Civil Service (Medical): No    Lack of Transportation (Non-Medical): No  Physical Activity: Not on file  Stress: No Stress Concern Present (04/12/2021)   [employer] of Occupational Health - Occupational Stress Questionnaire    Feeling of Stress : Not at all  Social Connections: Socially Integrated (04/12/2021)   Social Connection and Isolation Panel [NHANES]  Frequency of Communication with Friends and Family: More than three times a week    Frequency of Social Gatherings with Friends and Family: More than three times a week    Attends Religious Services: More than 4 times per year    Active Member of [employer] or Organizations: Yes    Attends Engineer, structural: More than 4 times per year    Marital Status: Married   Family History  Problem Relation Age of Onset   Breast cancer Mother    Depression Mother    Heart failure Father    Depression Father    Arthritis Maternal Grandmother    Allergies  Allergen Reactions   Nitroglycerin     Migraine   Oxycodone Itching   Prior to Admission medications   Medication Sig Start Date End Date Taking? Authorizing Provider  buPROPion (WELLBUTRIN XL) 300 MG 24 hr tablet Take 1 tablet (300 mg total) by mouth daily. 01/25/22  Yes Seabron Spates R, DO  diazepam (VALIUM) 5 MG tablet 1 po qhs prn 05/19/22  Yes Lowne Chase, Myrene Buddy R, DO  levocetirizine (XYZAL) 5 MG tablet TAKE 1 TABLET EVERY DAY IN THE EVENING 02/06/22  Yes Lowne Florina Ou R, DO  lisdexamfetamine (VYVANSE) 60 MG capsule Take 1 capsule (60 mg total) by mouth every morning. 05/19/22  Yes Seabron Spates R, DO  pantoprazole (PROTONIX) 40 MG tablet Take 1 tablet (40 mg total) by mouth daily. 01/03/22  Yes Donato Schultz, DO  Melatonin-GABA-Valerian 07-08-48 MG CAPS 1 po qhs prn 04/12/21   Zola Button, Yvonne R, DO     Positive ROS: none  All other systems have been reviewed and were otherwise negative with the exception of those mentioned in the HPI and as above.  Physical Exam: There were no vitals filed for this visit.  General: Alert, no acute distress Cardiovascular: No pedal edema Respiratory: No cyanosis, no use of accessory musculature GI: No organomegaly, abdomen is soft and non-tender Skin: No lesions in the area of chief complaint Neurologic: Sensation intact distally Psychiatric: Patient is competent for consent with normal mood and affect Lymphatic: No axillary or cervical lymphadenopathy  MUSCULOSKELETAL: l knee: +ttp ovr med jt line. No instability, trace effusion  Assessment/Plan: MEDIAL MENISCAL TEAR LEFT KNEE Plan for Procedure(s): ARTHROSCOPY KNEE  The risks benefits and alternatives were discussed with the patient including but not limited to the risks of nonoperative treatment, versus surgical intervention including infection, bleeding, nerve injury, malunion, nonunion, hardware prominence, hardware failure, need for hardware removal, blood clots, cardiopulmonary complications, morbidity, mortality, among others, and they were willing to proceed.  Predicted outcome is good, although there will be at least a six to nine month expected recovery.  [name], MD 06/14/2022 10:19 PM

## 2022-06-15 ENCOUNTER — Ambulatory Visit (HOSPITAL_BASED_OUTPATIENT_CLINIC_OR_DEPARTMENT_OTHER): Payer: Medicare HMO | Admitting: Anesthesiology

## 2022-06-15 ENCOUNTER — Ambulatory Visit (HOSPITAL_BASED_OUTPATIENT_CLINIC_OR_DEPARTMENT_OTHER)
Admission: RE | Admit: 2022-06-15 | Discharge: 2022-06-15 | Disposition: A | Discharge status: Home / Self Care | Payer: Medicare HMO | Attending: Orthopedic Surgery | Admitting: Orthopedic Surgery

## 2022-06-15 ENCOUNTER — Other Ambulatory Visit: Payer: Self-pay

## 2022-06-15 ENCOUNTER — Encounter (HOSPITAL_BASED_OUTPATIENT_CLINIC_OR_DEPARTMENT_OTHER): Payer: Self-pay | Admitting: Orthopedic Surgery

## 2022-06-15 ENCOUNTER — Encounter (HOSPITAL_BASED_OUTPATIENT_CLINIC_OR_DEPARTMENT_OTHER)
Admission: RE | Disposition: A | Discharge status: Home / Self Care | Payer: Self-pay | Source: Home / Self Care | Attending: Orthopedic Surgery

## 2022-06-15 DIAGNOSIS — X58XXXA Exposure to other specified factors, initial encounter: Secondary | ICD-10-CM | POA: Diagnosis not present

## 2022-06-15 DIAGNOSIS — M2242 Chondromalacia patellae, left knee: Secondary | ICD-10-CM

## 2022-06-15 DIAGNOSIS — M6752 Plica syndrome, left knee: Secondary | ICD-10-CM | POA: Diagnosis not present

## 2022-06-15 DIAGNOSIS — Z01818 Encounter for other preprocedural examination: Secondary | ICD-10-CM

## 2022-06-15 DIAGNOSIS — S83281A Other tear of lateral meniscus, current injury, right knee, initial encounter: Secondary | ICD-10-CM | POA: Diagnosis not present

## 2022-06-15 DIAGNOSIS — S83242A Other tear of medial meniscus, current injury, left knee, initial encounter: Secondary | ICD-10-CM | POA: Diagnosis not present

## 2022-06-15 DIAGNOSIS — I10 Essential (primary) hypertension: Secondary | ICD-10-CM | POA: Diagnosis not present

## 2022-06-15 DIAGNOSIS — M94262 Chondromalacia, left knee: Secondary | ICD-10-CM | POA: Diagnosis present

## 2022-06-15 DIAGNOSIS — S83282A Other tear of lateral meniscus, current injury, left knee, initial encounter: Secondary | ICD-10-CM | POA: Diagnosis present

## 2022-06-15 HISTORY — PX: KNEE ARTHROSCOPY WITH MEDIAL MENISECTOMY: SHX5651

## 2022-06-15 SURGERY — ARTHROSCOPY, KNEE, WITH MEDIAL MENISCECTOMY
Anesthesia: General | Site: Knee | Laterality: Left

## 2022-06-15 MED ORDER — HYDROCODONE-ACETAMINOPHEN 5-325 MG PO TABS: 1.0000 | ORAL_TABLET | ORAL | 0 refills | Status: DC | PRN

## 2022-06-15 MED ORDER — FENTANYL CITRATE (PF) 100 MCG/2ML IJ SOLN
INTRAMUSCULAR | Status: DC | PRN
  Administered 2022-06-15: 100 ug via INTRAVENOUS

## 2022-06-15 MED ORDER — EPINEPHRINE PF 1 MG/ML IJ SOLN
INTRAMUSCULAR | Status: AC
  Filled 2022-06-15: qty 2

## 2022-06-15 MED ORDER — CEFAZOLIN SODIUM-DEXTROSE 2-4 GM/100ML-% IV SOLN: 2.0000 g | INTRAVENOUS | Status: DC

## 2022-06-15 MED ORDER — BUPIVACAINE HCL (PF) 0.25 % IJ SOLN
INTRAMUSCULAR | Status: AC
  Filled 2022-06-15: qty 30

## 2022-06-15 MED ORDER — HYDROCODONE-ACETAMINOPHEN 5-325 MG PO TABS
ORAL_TABLET | ORAL | Status: AC
  Filled 2022-06-15: qty 1

## 2022-06-15 MED ORDER — LIDOCAINE HCL (CARDIAC) PF 100 MG/5ML IV SOSY
PREFILLED_SYRINGE | INTRAVENOUS | Status: DC | PRN
  Administered 2022-06-15: 60 mg via INTRAVENOUS

## 2022-06-15 MED ORDER — FENTANYL CITRATE (PF) 100 MCG/2ML IJ SOLN
INTRAMUSCULAR | Status: AC
  Filled 2022-06-15: qty 2

## 2022-06-15 MED ORDER — LIDOCAINE 2% (20 MG/ML) 5 ML SYRINGE
INTRAMUSCULAR | Status: AC
  Filled 2022-06-15: qty 5

## 2022-06-15 MED ORDER — HYDROMORPHONE HCL 1 MG/ML IJ SOLN
0.2500 mg | INTRAMUSCULAR | Status: DC | PRN
  Administered 2022-06-15: 0.5 mg via INTRAVENOUS

## 2022-06-15 MED ORDER — MEPERIDINE HCL 25 MG/ML IJ SOLN: 6.2500 mg | INTRAMUSCULAR | Status: DC | PRN

## 2022-06-15 MED ORDER — ONDANSETRON HCL 4 MG/2ML IJ SOLN
INTRAMUSCULAR | Status: AC
  Filled 2022-06-15: qty 2

## 2022-06-15 MED ORDER — DEXAMETHASONE SODIUM PHOSPHATE 10 MG/ML IJ SOLN
INTRAMUSCULAR | Status: AC
  Filled 2022-06-15: qty 1

## 2022-06-15 MED ORDER — DROPERIDOL 2.5 MG/ML IJ SOLN
INTRAMUSCULAR | Status: DC | PRN
  Administered 2022-06-15: .625 mg via INTRAVENOUS

## 2022-06-15 MED ORDER — PROMETHAZINE HCL 25 MG/ML IJ SOLN: 6.2500 mg | INTRAMUSCULAR | Status: DC | PRN

## 2022-06-15 MED ORDER — PROPOFOL 10 MG/ML IV BOLUS
INTRAVENOUS | Status: AC
  Filled 2022-06-15: qty 20

## 2022-06-15 MED ORDER — HYDROCODONE-ACETAMINOPHEN 5-325 MG PO TABS
1.0000 | ORAL_TABLET | Freq: Once | ORAL | Status: AC
  Administered 2022-06-15: 1 via ORAL

## 2022-06-15 MED ORDER — MIDAZOLAM HCL 2 MG/2ML IJ SOLN
INTRAMUSCULAR | Status: DC | PRN
  Administered 2022-06-15: 2 mg via INTRAVENOUS

## 2022-06-15 MED ORDER — PROPOFOL 500 MG/50ML IV EMUL
INTRAVENOUS | Status: DC | PRN
  Administered 2022-06-15: 35 ug/kg/min via INTRAVENOUS

## 2022-06-15 MED ORDER — BUPIVACAINE HCL (PF) 0.5 % IJ SOLN
INTRAMUSCULAR | Status: AC
  Filled 2022-06-15: qty 30

## 2022-06-15 MED ORDER — DEXAMETHASONE SODIUM PHOSPHATE 10 MG/ML IJ SOLN
INTRAMUSCULAR | Status: DC | PRN
  Administered 2022-06-15: 10 mg via INTRAVENOUS

## 2022-06-15 MED ORDER — SODIUM CHLORIDE 0.9 % IR SOLN
Status: DC | PRN
  Administered 2022-06-15: 6000 mL

## 2022-06-15 MED ORDER — CEFAZOLIN SODIUM-DEXTROSE 2-3 GM-%(50ML) IV SOLR
INTRAVENOUS | Status: DC | PRN
  Administered 2022-06-15: 2 g via INTRAVENOUS

## 2022-06-15 MED ORDER — DROPERIDOL 2.5 MG/ML IJ SOLN
INTRAMUSCULAR | Status: AC
  Filled 2022-06-15: qty 2

## 2022-06-15 MED ORDER — MIDAZOLAM HCL 2 MG/2ML IJ SOLN
INTRAMUSCULAR | Status: AC
  Filled 2022-06-15: qty 2

## 2022-06-15 MED ORDER — BUPIVACAINE HCL (PF) 0.5 % IJ SOLN
INTRAMUSCULAR | Status: DC | PRN
  Administered 2022-06-15: 20 mL

## 2022-06-15 MED ORDER — LACTATED RINGERS IV SOLN: INTRAVENOUS | Status: DC | PRN

## 2022-06-15 MED ORDER — HYDROMORPHONE HCL 1 MG/ML IJ SOLN
INTRAMUSCULAR | Status: AC
  Filled 2022-06-15: qty 0.5

## 2022-06-15 MED ORDER — LACTATED RINGERS IV SOLN: INTRAVENOUS | Status: DC

## 2022-06-15 MED ORDER — PROPOFOL 10 MG/ML IV BOLUS
INTRAVENOUS | Status: DC | PRN
  Administered 2022-06-15: 200 mg via INTRAVENOUS

## 2022-06-15 MED ORDER — ONDANSETRON HCL 4 MG/2ML IJ SOLN
INTRAMUSCULAR | Status: DC | PRN
  Administered 2022-06-15: 4 mg via INTRAVENOUS

## 2022-06-15 MED ORDER — CEFAZOLIN SODIUM-DEXTROSE 2-4 GM/100ML-% IV SOLN
INTRAVENOUS | Status: AC
  Filled 2022-06-15: qty 100

## 2022-06-15 SURGICAL SUPPLY — 32 items
BLADE EXCALIBUR 4.0X13 (MISCELLANEOUS) ×1 IMPLANT
BNDG CMPR 5X62 HK CLSR LF (GAUZE/BANDAGES/DRESSINGS) ×1
BNDG CMPR 6"X 5 YARDS HK CLSR (GAUZE/BANDAGES/DRESSINGS) ×1
BNDG ELASTIC 6INX 5YD STR LF (GAUZE/BANDAGES/DRESSINGS) ×2 IMPLANT
DISSECTOR 4.0MM X 13CM (MISCELLANEOUS) IMPLANT
DRAPE ARTHROSCOPY W/POUCH 90 (DRAPES) ×2 IMPLANT
DRSG EMULSION OIL 3X3 NADH (GAUZE/BANDAGES/DRESSINGS) ×2 IMPLANT
DURAPREP 26ML APPLICATOR (WOUND CARE) ×2 IMPLANT
ELECT MENISCUS 165MM 90D (ELECTRODE) IMPLANT
ELECT REM PT RETURN 9FT ADLT (ELECTROSURGICAL)
ELECTRODE REM PT RTRN 9FT ADLT (ELECTROSURGICAL) IMPLANT
GAUZE SPONGE 4X4 12PLY STRL (GAUZE/BANDAGES/DRESSINGS) ×1 IMPLANT
GLOVE BIOGEL PI IND STRL 8 (GLOVE) ×2 IMPLANT
GLOVE ECLIPSE 7.5 STRL STRAW (GLOVE) ×4 IMPLANT
GOWN STRL REUS W/ TWL LRG LVL3 (GOWN DISPOSABLE) ×1 IMPLANT
GOWN STRL REUS W/ TWL XL LVL3 (GOWN DISPOSABLE) ×2 IMPLANT
GOWN STRL REUS W/TWL LRG LVL3 (GOWN DISPOSABLE) ×1
GOWN STRL REUS W/TWL XL LVL3 (GOWN DISPOSABLE) ×1
HOLDER KNEE FOAM BLUE (MISCELLANEOUS) ×2 IMPLANT
MANIFOLD NEPTUNE II (INSTRUMENTS) IMPLANT
NDL SAFETY ECLIP 18X1.5 (MISCELLANEOUS) IMPLANT
PACK ARTHROSCOPY DSU (CUSTOM PROCEDURE TRAY) ×1 IMPLANT
PACK BASIN DAY SURGERY FS (CUSTOM PROCEDURE TRAY) ×1 IMPLANT
PAD CAST 4YDX4 CTTN HI CHSV (CAST SUPPLIES) ×2 IMPLANT
PADDING CAST COTTON 4X4 STRL (CAST SUPPLIES) ×1
PENCIL SMOKE EVACUATOR (MISCELLANEOUS) IMPLANT
SUT ETHILON 4 0 PS 2 18 (SUTURE) IMPLANT
SYR 5ML LL (SYRINGE) ×2 IMPLANT
TOWEL GREEN STERILE FF (TOWEL DISPOSABLE) ×2 IMPLANT
TUBING ARTHROSCOPY IRRIG 16FT (MISCELLANEOUS) ×2 IMPLANT
WATER STERILE IRR 1000ML POUR (IV SOLUTION) ×1 IMPLANT
WRAP KNEE MAXI GEL POST OP (GAUZE/BANDAGES/DRESSINGS) ×1 IMPLANT

## 2022-06-15 NOTE — Anesthesia Preprocedure Evaluation (Signed)
Anesthesia Evaluation  Patient identified by MRN, date of birth, ID band Patient awake    Reviewed: Allergy & Precautions, NPO status , Patient's Chart, lab work & pertinent test results  History of Anesthesia Complications (+) PONV and history of anesthetic complications  Airway Mallampati: III  TM Distance: >3 FB Neck ROM: Limited  Mouth opening: Limited Mouth Opening  Dental no notable dental hx. (+) Teeth Intact, Dental Advisory Given   Pulmonary sleep apnea    Pulmonary exam normal breath sounds clear to auscultation       Cardiovascular hypertension, Pt. on medications negative cardio ROS Normal cardiovascular exam Rhythm:Regular Rate:Normal     Neuro/Psych  Headaches PSYCHIATRIC DISORDERS Anxiety Depression       GI/Hepatic Neg liver ROS,GERD  Medicated,,  Endo/Other  negative endocrine ROS    Renal/GU negative Renal ROS  negative genitourinary   Musculoskeletal  (+) Arthritis , Osteoarthritis,  Fibromyalgia -  Abdominal  (+) + obese  Peds  (+) ATTENTION DEFICIT DISORDER WITHOUT HYPERACTIVITY Hematology negative hematology ROS (+)   Anesthesia Other Findings   Reproductive/Obstetrics                             Anesthesia Physical Anesthesia Plan  ASA: 2  Anesthesia Plan: General   Post-op Pain Management:    Induction: Intravenous  PONV Risk Score and Plan: 4 or greater and Midazolam, Dexamethasone, Ondansetron, Droperidol and Treatment may vary due to age or medical condition  Airway Management Planned: LMA  Additional Equipment:   Intra-op Plan:   Post-operative Plan: Extubation in OR  Informed Consent: I have reviewed the patients History and Physical, chart, labs and discussed the procedure including the risks, benefits and alternatives for the proposed anesthesia with the patient or authorized representative who has indicated his/her understanding and  acceptance.     Dental advisory given  Plan Discussed with: CRNA  Anesthesia Plan Comments:         Anesthesia Quick Evaluation

## 2022-06-15 NOTE — Anesthesia Postprocedure Evaluation (Signed)
Anesthesia Post Note  Patient: Theresa [name]  Procedure(s) Performed: KNEE ARTHROSCOPY WITH PARTIAL MEDIAL AND PARTIAL LATERAL MENISECTOMY, CHONDROPLASTY MEDIAL AND LATERAL (Left: Knee)     Patient location during evaluation: PACU Anesthesia Type: General Level of consciousness: awake and alert Pain management: pain level controlled Vital Signs Assessment: post-procedure vital signs reviewed and stable Respiratory status: spontaneous breathing, nonlabored ventilation and respiratory function stable Cardiovascular status: blood pressure returned to baseline and stable Postop Assessment: no apparent nausea or vomiting Anesthetic complications: no   No notable events documented.  Last Vitals:  Vitals:   06/15/22 0914 06/15/22 0916  BP:    Pulse:    Resp:    Temp:  (!) 36.1 C  SpO2: 99%     Last Pain:  Vitals:   06/15/22 0908  TempSrc:   PainSc: 4                  Lowella Curb

## 2022-06-15 NOTE — Anesthesia Procedure Notes (Signed)
Procedure Name: LMA Insertion Date/Time: 06/15/2022 8:26 AM  Performed by: Karen Kitchens, CRNAPre-anesthesia Checklist: Patient identified, Emergency Drugs available, Suction available and Patient being monitored Patient Re-evaluated:Patient Re-evaluated prior to induction Oxygen Delivery Method: Circle system utilized Preoxygenation: Pre-oxygenation with 100% oxygen Induction Type: IV induction Ventilation: Mask ventilation without difficulty LMA: LMA inserted LMA Size: 4.0 Number of attempts: 1 Airway Equipment and Method: Bite block Placement Confirmation: positive ETCO2 Tube secured with: Tape Dental Injury: Teeth and Oropharynx as per pre-operative assessment

## 2022-06-15 NOTE — Brief Op Note (Signed)
06/15/2022  8:19 AM  PATIENT:  Theresa [name]  67 y.o. female  PRE-OPERATIVE DIAGNOSIS:  MEDIAL MENISCAL TEAR LEFT KNEE  POST-OPERATIVE DIAGNOSIS:  MEDIAL MENISCAL TEAR LEFT KNEE  PROCEDURE:  Procedure(s): KNEE ARTHROSCOPY WITH PARTIAL MEDIAL AND PARTIAL LATERAL MENISECTOMY, CHONDROPLASTY MEDIAL AND LATERAL (Left)  SURGEON:  Surgeon(s) and Role:    Jodi Geralds, MD - Primary  PHYSICIAN ASSISTANT:   ASSISTANTS: none   ANESTHESIA:   general  EBL:  15 mL   BLOOD ADMINISTERED:none  DRAINS: none   LOCAL MEDICATIONS USED:  MARCAINE     SPECIMEN:  No Specimen  DISPOSITION OF SPECIMEN:  N/A  COUNTS:  YES  TOURNIQUET:  * No tourniquets in log *  DICTATION: .Other Dictation: Dictation Number [id number]  PLAN OF CARE: Discharge to home after PACU  PATIENT DISPOSITION:  PACU - hemodynamically stable.   Delay start of Pharmacological VTE agent (>24hrs) due to surgical blood loss or risk of bleeding: no

## 2022-06-15 NOTE — Transfer of Care (Signed)
Immediate Anesthesia Transfer of Care Note  Patient: Triva A Perren  Procedure(s) Performed: KNEE ARTHROSCOPY WITH PARTIAL MEDIAL AND PARTIAL LATERAL MENISECTOMY, CHONDROPLASTY MEDIAL AND LATERAL (Left: Knee)  Patient Location: PACU  Anesthesia Type:General  Level of Consciousness: awake, drowsy, and patient cooperative  Airway & Oxygen Therapy: Patient Spontanous Breathing and Patient connected to face mask oxygen  Post-op Assessment: Report given to RN and Post -op Vital signs reviewed and stable  Post vital signs: Reviewed and stable  Last Vitals:  Vitals Value Taken Time  BP 131/83 06/15/22 0823  Temp    Pulse 76 06/15/22 0824  Resp 11 06/15/22 0824  SpO2 97 % 06/15/22 0824  Vitals shown include unvalidated device data.  Last Pain:  Vitals:   06/15/22 0626  TempSrc: Temporal  PainSc: 0-No pain      Patients Stated Pain Goal: 6 (06/15/22 1610)  Complications: No notable events documented.

## 2022-06-15 NOTE — Discharge Instructions (Addendum)
°Post Anesthesia Home Care Instructions ° °Activity: °Get plenty of rest for the remainder of the day. A responsible individual must stay with you for 24 hours following the procedure.  °For the next 24 hours, DO NOT: °-Drive a car °-Operate machinery °-Drink alcoholic beverages °-Take any medication unless instructed by your physician °-Make any legal decisions or sign important papers. ° °Meals: °Start with liquid foods such as gelatin or soup. Progress to regular foods as tolerated. Avoid greasy, spicy, heavy foods. If nausea and/or vomiting occur, drink only clear liquids until the nausea and/or vomiting subsides. Call your physician if vomiting continues. ° °Special Instructions/Symptoms: °Your throat may feel dry or sore from the anesthesia or the breathing tube placed in your throat during surgery. If this causes discomfort, gargle with warm salt water. The discomfort should disappear within 24 hours. ° °If you had a scopolamine patch placed behind your ear for the management of post- operative nausea and/or vomiting: ° °1. The medication in the patch is effective for 72 hours, after which it should be removed.  Wrap patch in a tissue and discard in the trash. Wash hands thoroughly with soap and water. °2. You may remove the patch earlier than 72 hours if you experience unpleasant side effects which may include dry mouth, dizziness or visual disturbances. °3. Avoid touching the patch. Wash your hands with soap and water after contact with the patch. °  °POST-OP KNEE ARTHROSCOPY INSTRUCTIONS  °Dr. John Graves/Jim Jandiel Magallanes PA-C ° °Pain °You will be expected to have a moderate amount of pain in the affected knee for approximately two weeks. However, the first two days will be the most severe pain. A prescription has been provided to take as needed for the pain. The pain can be reduced by applying ice packs to the knee for the first 1-2 weeks post surgery. Also, keeping the leg elevated on pillows will help  alleviate the pain. If you develop any acute pain or swelling in your calf muscle, please call the doctor. ° °Activity °It is preferred that you stay at bed rest for approximately 24 hours. However, you may go to the bathroom with help. Weight bearing as tolerated. You may begin the knee exercises the day of surgery. Discontinue crutches as the knee pain resolves. ° °Dressing °Keep the dressing dry. If the ace bandage should wrinkle or roll up, this can be rewrapped to prevent ridges in the bandage. You may remove all dressings in 48 hours,  apply bandaids to each wound. You may shower on the 4th day after surgery but no tub bath. ° °Symptoms to report to your doctor °Extreme pain °Extreme swelling °Temperature above 101 degrees °Change in the feeling, color, or movement of your toes °Redness, heat, or swelling at your incision ° °Exercise °If is preferred that as soon as possible you try to do a straight leg raise without bending the knee and concentrate on bringing the heel of your foot off the bed up to approximately 45 degrees and hold for the count of 10 seconds. Repeat this at least 10 times three or four times per day. Additional exercises are provided below. ° °You are encouraged to bend the knee as tolerated. ° °Follow-Up °Call to schedule a follow-up appointment in 5-7 days. Our office # is [phone]. ° °POST-OP EXERCISES ° °Short Arc Quads ° °1. Lie on back with legs straight. Place towel roll under thigh, just above knee. °2. Tighten thigh muscles to straighten knee and lift heel off bed. °  3. Hold for slow count of five, then lower. °4. Do three sets of ten ° ° ° °Straight Leg Raises ° °1. Lie on back with operative leg straight and other leg bent. °2. Keeping operative leg completely straight, slowly lift operative leg so foot is 5 inches off bed. °3. Hold for slow count of five, then lower. °4. Do three sets of ten. ° ° ° °DO BOTH EXERCISES 2 TIMES A DAY ° °Ankle Pumps ° °Work/move the operative ankle  and foot up and down 10 times every hour while awake. °

## 2022-06-15 NOTE — Addendum Note (Signed)
Addendum  created 06/15/22 0953 by Karen Kitchens, CRNA   Intraprocedure Meds edited

## 2022-06-15 NOTE — Op Note (Signed)
NA[place]OD[place], WESS MEDICAL RECORD NO: [id number] ACCOUNT NO: [id number] DATE OF BIRTH: 02-04-1955 FACILITY: [facility] LOCATION: [facility] PHYSICIAN: [name], MD  Operative Report   DATE OF PROCEDURE: 06/15/2022  PREOPERATIVE DIAGNOSES:  Medial meniscal tear and chondromalacia.  POSTOPERATIVE DIAGNOSES: 1.  Medial meniscal tear. 2.  Lateral meniscal tear. 3.  Chondromalacia, medial and lateral compartments. 4.  Medial and lateral plicas.  PRINCIPAL PROCEDURES: 1.  Left knee arthroscopy with partial medial and lateral meniscectomy. 2.  Abrasion arthroplasty, medial femoral condyle, lateral tibial plateau. 3.  Medial and lateral plica excision.  SURGEON:  [name], MD  ASSISTANT:  None.  ANESTHESIA:  General.  BRIEF HISTORY:  The patient has a long history of significant left knee pain.  She has been treated conservatively for a prolonged period of time. After failure of conservative care, she was taken to the operating room for left knee arthroscopy.  DESCRIPTION OF PROCEDURE:  Patient was taken to the operating room.  After adequate anesthesia obtained with general anesthetic, the patient was placed supine on the operating table. Left knee was prepped and draped in the usual sterile fashion.   Following this, routine arthroscopic examination of the knee revealed that there was a significant medial plica, which was blocking access into the medial compartment and abrading the medial femoral condyle.  This was debrided back to the rim.  Attention  was turned to the medial compartment where there was a complex tear of the posterior horn of the medial meniscus, which was debrided back to a smooth and stable rim.  This started at the root and went around medially.  Once this was done, attention was  turned to the medial femoral condyle which had some grade 2 and grade 3 changes as well as a small grade 4 changes.  This was debrided back to a smooth and stable rim of articular  cartilage down to bleeding bone where necessary.  Following this,  attention was turned to the lateral compartment where there was an obvious lateral meniscal tear of the posterior horn extending around to the mid body.  This was debrided back to a smooth and stable rim.  There was chondromalacia of the lateral tibial  plateau, which was debrided back to a smooth and stable rim of articular cartilage.  Attention was turned to that lateral compartment, this was a large lateral plica, which was debrided back to the rim.  Once this was done, the knee was copiously  irrigated and suctioned dry.  Final check was made for any loose and fragmented pieces of articular cartilage and meniscus, none was seen. Again, irrigated, suctioned dry and then the portals were closed with a bandage.  Sterile compressive dressing was  applied.  20 mL of 0.5% Marcaine was instilled in the knee for postoperative anesthesia.  The patient was taken to recovery and was noted to be in satisfactory condition.  Estimated blood loss for procedure was minimal.   NIK D: 06/15/2022 8:22:55 am T: 06/15/2022 8:40:00 am  JOB: [id number]/ [id number]

## 2022-06-15 NOTE — Interval H&P Note (Signed)
History and Physical Interval Note:  06/15/2022 7:31 AM  Theresa [name]  has presented today for surgery, with the diagnosis of MEDIAL MENISCAL TEAR LEFT KNEE.  The various methods of treatment have been discussed with the patient and family. After consideration of risks, benefits and other options for treatment, the patient has consented to  Procedure(s): ARTHROSCOPY KNEE (Left) as a surgical intervention.  The patient's history has been reviewed, patient examined, no change in status, stable for surgery.  I have reviewed the patient's chart and labs.  Questions were answered to the patient's satisfaction.     [name]

## 2022-06-17 ENCOUNTER — Encounter (HOSPITAL_BASED_OUTPATIENT_CLINIC_OR_DEPARTMENT_OTHER): Payer: Self-pay | Admitting: Orthopedic Surgery

## 2022-06-26 DIAGNOSIS — M25662 Stiffness of left knee, not elsewhere classified: Secondary | ICD-10-CM | POA: Diagnosis not present

## 2022-06-26 DIAGNOSIS — R531 Weakness: Secondary | ICD-10-CM | POA: Diagnosis not present

## 2022-06-29 ENCOUNTER — Encounter: Payer: Self-pay | Admitting: Family Medicine

## 2022-06-29 ENCOUNTER — Other Ambulatory Visit: Payer: Self-pay | Admitting: Family Medicine

## 2022-06-29 DIAGNOSIS — G4700 Insomnia, unspecified: Secondary | ICD-10-CM

## 2022-06-29 DIAGNOSIS — R41840 Attention and concentration deficit: Secondary | ICD-10-CM

## 2022-06-29 MED ORDER — DIAZEPAM 5 MG PO TABS: ORAL_TABLET | ORAL | 1 refills | Status: DC

## 2022-06-29 MED ORDER — LISDEXAMFETAMINE DIMESYLATE 60 MG PO CAPS
60.0000 mg | ORAL_CAPSULE | ORAL | 0 refills | Status: DC
Start: 2022-06-29 — End: 2022-07-31

## 2022-06-29 NOTE — Telephone Encounter (Signed)
Requesting: diazepam 5mg  and Vyvanse 60mg   Contract: 03/26/20 UDS: 03/26/20 Last Visit: 01/27/22 Next Visit: None Last Refill on diazepam 05/19/22 #30 and 1RF Last Refill on Vyvanse 05/19/22 #30 and 0RF  Please Advise

## 2022-07-02 ENCOUNTER — Encounter: Payer: Self-pay | Admitting: Family Medicine

## 2022-07-02 DIAGNOSIS — K2970 Gastritis, unspecified, without bleeding: Secondary | ICD-10-CM

## 2022-07-03 MED ORDER — PANTOPRAZOLE SODIUM 40 MG PO TBEC
40.0000 mg | DELAYED_RELEASE_TABLET | Freq: Every day | ORAL | 1 refills | Status: DC
Start: 2022-07-03 — End: 2022-11-30

## 2022-07-28 ENCOUNTER — Encounter: Payer: Medicare HMO | Admitting: Family Medicine

## 2022-07-31 ENCOUNTER — Encounter: Payer: Self-pay | Admitting: Family Medicine

## 2022-07-31 ENCOUNTER — Ambulatory Visit (INDEPENDENT_AMBULATORY_CARE_PROVIDER_SITE_OTHER): Payer: Medicare HMO | Admitting: Family Medicine

## 2022-07-31 VITALS — BP 134/90 | HR 78 | Temp 98.6°F | Resp 18 | Ht 64.0 in | Wt 195.4 lb

## 2022-07-31 DIAGNOSIS — Z136 Encounter for screening for cardiovascular disorders: Secondary | ICD-10-CM | POA: Diagnosis not present

## 2022-07-31 DIAGNOSIS — F988 Other specified behavioral and emotional disorders with onset usually occurring in childhood and adolescence: Secondary | ICD-10-CM | POA: Diagnosis not present

## 2022-07-31 DIAGNOSIS — F418 Other specified anxiety disorders: Secondary | ICD-10-CM | POA: Diagnosis not present

## 2022-07-31 DIAGNOSIS — Z0000 Encounter for general adult medical examination without abnormal findings: Secondary | ICD-10-CM | POA: Diagnosis not present

## 2022-07-31 LAB — TSH: TSH: 2.32 u[IU]/mL (ref 0.35–5.50)

## 2022-07-31 LAB — COMPREHENSIVE METABOLIC PANEL
ALT: 12 U/L (ref 0–35)
AST: 14 U/L (ref 0–37)
Albumin: 4.1 g/dL (ref 3.5–5.2)
Alkaline Phosphatase: 63 U/L (ref 39–117)
BUN: 21 mg/dL (ref 6–23)
CO2: 26 mEq/L (ref 19–32)
Calcium: 9.2 mg/dL (ref 8.4–10.5)
Chloride: 106 mEq/L (ref 96–112)
Creatinine, Ser: 0.89 mg/dL (ref 0.40–1.20)
GFR: 66.66 mL/min (ref >60.00)
Glucose, Bld: 88 mg/dL (ref 70–99)
Potassium: 4.3 mEq/L (ref 3.5–5.1)
Sodium: 141 mEq/L (ref 135–145)
Total Bilirubin: 0.3 mg/dL (ref 0.2–1.2)
Total Protein: 6.5 g/dL (ref 6.0–8.3)

## 2022-07-31 LAB — CBC WITH DIFFERENTIAL/PLATELET
Basophils Absolute: 0 10*3/uL (ref 0.0–0.1)
Basophils Relative: 1 % (ref 0.0–3.0)
Eosinophils Absolute: 0.2 10*3/uL (ref 0.0–0.7)
Eosinophils Relative: 4.1 % (ref 0.0–5.0)
HCT: 39.1 % (ref 36.0–46.0)
Hemoglobin: 12.4 g/dL (ref 12.0–15.0)
Lymphocytes Relative: 40.6 % (ref 12.0–46.0)
Lymphs Abs: 1.9 10*3/uL (ref 0.7–4.0)
MCHC: 31.6 g/dL (ref 30.0–36.0)
MCV: 84.3 fl (ref 78.0–100.0)
Monocytes Absolute: 0.5 10*3/uL (ref 0.1–1.0)
Monocytes Relative: 10.1 % (ref 3.0–12.0)
Neutro Abs: 2.1 10*3/uL (ref 1.4–7.7)
Neutrophils Relative %: 44.2 % (ref 43.0–77.0)
Platelets: 270 10*3/uL (ref 150.0–400.0)
RBC: 4.63 Mil/uL (ref 3.87–5.11)
RDW: 16.2 % — ABNORMAL HIGH (ref 11.5–15.5)
WBC: 4.7 10*3/uL (ref 4.0–10.5)

## 2022-07-31 LAB — LIPID PANEL
Cholesterol: 190 mg/dL (ref 0–200)
HDL: 72.1 mg/dL (ref >39.00)
LDL Cholesterol: 91 mg/dL (ref 0–99)
NonHDL: 118.09
Total CHOL/HDL Ratio: 3
Triglycerides: 137 mg/dL (ref 0.0–149.0)
VLDL: 27.4 mg/dL (ref 0.0–40.0)

## 2022-07-31 MED ORDER — DESVENLAFAXINE SUCCINATE ER 50 MG PO TB24
50.0000 mg | ORAL_TABLET | Freq: Every day | ORAL | 3 refills | Status: DC
Start: 2022-07-31 — End: 2022-09-04

## 2022-07-31 MED ORDER — LISDEXAMFETAMINE DIMESYLATE 70 MG PO CAPS: 70.0000 mg | ORAL_CAPSULE | Freq: Every day | ORAL | 0 refills | Status: DC

## 2022-07-31 NOTE — Assessment & Plan Note (Signed)
Inc vyvanse to 70 mg daily  F/u 1 month

## 2022-07-31 NOTE — Progress Notes (Signed)
Established Patient Office Visit  Subjective   Patient ID: Theresa [name], female    DOB: 06-19-1955  Age: 67 y.o. MRN: [id number]  Chief Complaint  Patient presents with   Annual Exam    HPI Discussed the use of AI scribe software for clinical note transcription with the patient, who gave verbal consent to proceed.  History of Present Illness   The patient, with a history of depression and anxiety managed with Wellbutrin and Vyvanse, presents for a well check. She expresses concern that her current regimen may no longer be effective, as she continues to struggle with significant emotional distress. She attributes her distress to financial difficulties and a strained relationship with her daughters and grandchildren, who have not spoken to her in six years. Despite attempts to seek counsel from pastors and a counselor, she continues to feel 'crushed' and unsure of how to improve her situation. She has tried to keep busy with part-time work and gardening, but finds it difficult to hear about others' familial relationships. She has previously tried Lexapro, but found it made her feel 'dark.' She also reports difficulty sleeping, despite taking diazepam, and has tried melatonin in the past, which gave her migraines. She has had five panic attacks in the past 15-20 years, for which she takes diazepam.      Patient Active Problem List   Diagnosis Date Noted   Preventative health care 07/31/2022   Acute medial meniscal tear, left, initial encounter 06/15/2022   Acute lateral meniscal tear, left, initial encounter 06/15/2022   Chondromalacia, left knee 06/15/2022   Onychomycosis 10/10/2021   Radiculopathy 08/01/2018   Right arm pain 03/07/2018   Attention deficit disorder (ADD) in adult 03/07/2018   Slow transit constipation 03/07/2018   Attention deficit disorder 07/06/2016   Lower extremity edema 07/06/2016   Sebaceous cyst 03/19/2016   Toenail bruise 05/25/2014   OSA (obstructive sleep  apnea) 02/17/2013   Fibromyalgia 12/27/2012   Migraine with aura 12/27/2012   Seasonal allergies 12/27/2012   HX: breast cancer 12/27/2012   GERD (gastroesophageal reflux disease) 12/27/2012   HTN (hypertension) 12/27/2012   Obesity (BMI 30-39.9) 12/27/2012   Depression with anxiety 12/27/2012   Past Medical History:  Diagnosis Date   ADD (attention deficit disorder)    Anemia    Arthritis    neck   Breast cancer (HCC)    Left breast   Complication of anesthesia    1 time "I felt everything" - approx 2005, - or "maybe 1986 or 2010- not sure"   Constipation    probotic    Depression    Diverticulitis    Family history of adverse reaction to anesthesia    other  and PG- wake up during surgey   Fibromyalgia    GERD (gastroesophageal reflux disease)    occ   History of blood transfusion    History of stomach ulcers    Hypertension    "white coat syndrome"   Migraines    migraine since age 75    Pleurisy    Pneumonia    age 11   PONV (postoperative nausea and vomiting)    Urinary incontinence    Past Surgical History:  Procedure Laterality Date   ANTERIOR CERVICAL DECOMPRESSION/DISCECTOMY FUSION 4 LEVELS N/A 08/01/2018   Procedure: ANTERIOR CERVICAL DECOMPRESSION FUSION, CERVICAL FOUR TO FIVE, CERVICAL FIVE TO SIX , CERVICAL SIX TO SEVEN WITH INSTRUMENTATION AND ALLOGRAFT;  Surgeon: Estill Bamberg, MD;  Location: [facility];  Service: Orthopedics;  Laterality:  N/A;   APPENDECTOMY  1960   BREAST BIOPSY Left 1985   BREAST RECONSTRUCTION  1986 and 2010   x's 2   CHOLECYSTECTOMY  1987   COLONOSCOPY     KNEE ARTHROSCOPY WITH MEDIAL MENISECTOMY Left 06/15/2022   Procedure: KNEE ARTHROSCOPY WITH PARTIAL MEDIAL AND PARTIAL LATERAL MENISECTOMY, CHONDROPLASTY MEDIAL AND LATERAL;  Surgeon: Jodi Geralds, MD;  Location: [facility];  Service: Orthopedics;  Laterality: Left;   LASIK  2012   MASTECTOMY Left 1985   Social History   Tobacco Use   Smoking status: Never    Smokeless tobacco: Never  Vaping Use   Vaping status: Never Used  Substance Use Topics   Alcohol use: Yes    Comment: occasionally   Drug use: No   Social History   Socioeconomic History   Marital status: Married    Spouse name: Not on file   Number of children: Not on file   Years of education: Not on file   Highest education level: Not on file  Occupational History   Occupation: Engineer, site: [employer]  Tobacco Use   Smoking status: Never   Smokeless tobacco: Never  Vaping Use   Vaping status: Never Used  Substance and Sexual Activity   Alcohol use: Yes    Comment: occasionally   Drug use: No   Sexual activity: Yes  Other Topics Concern   Not on file  Social History Narrative   Not on file   Social Determinants of Health   Financial Resource Strain: Low Risk  (04/12/2021)   Overall Financial Resource Strain (CARDIA)    Difficulty of Paying Living Expenses: Not hard at all  Food Insecurity: No Food Insecurity (04/12/2021)   Hunger Vital Sign    Worried About Running Out of Food in the Last Year: Never true    Ran Out of Food in the Last Year: Never true  Transportation Needs: No Transportation Needs (04/12/2021)   PRAPARE - Administrator, Civil Service (Medical): No    Lack of Transportation (Non-Medical): No  Physical Activity: Not on file  Stress: No Stress Concern Present (04/12/2021)   [employer] of Occupational Health - Occupational Stress Questionnaire    Feeling of Stress : Not at all  Social Connections: Unknown (05/20/2021)   Received from [facility]   Social Network    Social Network: Not on file  Intimate Partner Violence: Unknown (04/12/2021)   Received from [facility]   HITS    Physically Hurt: Not on file    Insult or Talk Down To: Not on file    Threaten Physical Harm: Not on file    Scream or Curse: Not on file   Family Status  Relation Name Status   Mother  Alive   Father  Deceased   MGM   (Not Specified)  No partnership data on file   Family History  Problem Relation Age of Onset   Breast cancer Mother    Depression Mother    Heart failure Father    Depression Father    Arthritis Maternal Grandmother    Allergies  Allergen Reactions   Nitroglycerin     Migraine   Oxycodone Itching      Review of Systems  Constitutional:  Negative for chills, fever and malaise/fatigue.  HENT:  Negative for congestion and hearing loss.   Eyes:  Negative for blurred vision and discharge.  Respiratory:  Negative for cough, sputum production  and shortness of breath.   Cardiovascular:  Negative for chest pain, palpitations and leg swelling.  Gastrointestinal:  Negative for abdominal pain, blood in stool, constipation, diarrhea, heartburn, nausea and vomiting.  Genitourinary:  Negative for dysuria, frequency, hematuria and urgency.  Musculoskeletal:  Negative for back pain, falls and myalgias.  Skin:  Negative for rash.  Neurological:  Negative for dizziness, sensory change, loss of consciousness, weakness and headaches.  Endo/Heme/Allergies:  Negative for environmental allergies. Does not bruise/bleed easily.  Psychiatric/Behavioral:  Negative for depression and suicidal ideas. The patient is not nervous/anxious and does not have insomnia.       Objective:     BP (!) 134/90 (BP Location: Left Arm, Patient Position: Sitting, Cuff Size: Large)   Pulse 78   Temp 98.6 F (37 C) (Oral)   Resp 18   Ht 5\' 4"  (1.626 m)   Wt 195 lb 6.4 oz (88.6 kg)   SpO2 97%   BMI 33.54 kg/m  BP Readings from Last 3 Encounters:  07/31/22 (!) 134/90  06/15/22 (!) 160/84  01/27/22 124/86   Wt Readings from Last 3 Encounters:  07/31/22 195 lb 6.4 oz (88.6 kg)  06/15/22 191 lb 2.2 oz (86.7 kg)  01/27/22 188 lb 6.4 oz (85.5 kg)   SpO2 Readings from Last 3 Encounters:  07/31/22 97%  06/15/22 95%  01/27/22 97%      Physical Exam Vitals and nursing note reviewed.  Constitutional:       General: She is not in acute distress.    Appearance: Normal appearance. She is well-developed.  HENT:     Head: Normocephalic and atraumatic.     Right Ear: Tympanic membrane, ear canal and external ear normal. There is no impacted cerumen.     Left Ear: Tympanic membrane, ear canal and external ear normal. There is no impacted cerumen.     Nose: Nose normal.     Mouth/Throat:     Mouth: Mucous membranes are moist.     Pharynx: Oropharynx is clear. No oropharyngeal exudate or posterior oropharyngeal erythema.  Eyes:     General: No scleral icterus.       Right eye: No discharge.        Left eye: No discharge.     Conjunctiva/sclera: Conjunctivae normal.     Pupils: Pupils are equal, round, and reactive to light.  Neck:     Thyroid: No thyromegaly or thyroid tenderness.     Vascular: No JVD.  Cardiovascular:     Rate and Rhythm: Normal rate and regular rhythm.     Heart sounds: Normal heart sounds. No murmur heard. Pulmonary:     Effort: Pulmonary effort is normal. No respiratory distress.     Breath sounds: Normal breath sounds.  Abdominal:     General: Bowel sounds are normal. There is no distension.     Palpations: Abdomen is soft. There is no mass.     Tenderness: There is no abdominal tenderness. There is no guarding or rebound.  Genitourinary:    Vagina: Normal.  Musculoskeletal:        General: Normal range of motion.     Cervical back: Normal range of motion and neck supple.     Right lower leg: No edema.     Left lower leg: No edema.  Lymphadenopathy:     Cervical: No cervical adenopathy.  Skin:    General: Skin is warm and dry.     Findings: No erythema or rash.  Neurological:  Mental Status: She is alert and oriented to person, place, and time.     Cranial Nerves: No cranial nerve deficit.     Deep Tendon Reflexes: Reflexes are normal and symmetric.  Psychiatric:        Mood and Affect: Mood is anxious and depressed. Affect is tearful.        Behavior:  Behavior normal.        Thought Content: Thought content normal.        Judgment: Judgment normal.     No results found for any visits on 07/31/22.  Last CBC Lab Results  Component Value Date   WBC 4.2 03/26/2020   HGB 13.1 03/26/2020   HCT 39.8 03/26/2020   MCV 81.7 03/26/2020   MCH 27.0 03/13/2019   RDW 17.2 (H) 03/26/2020   PLT 269.0 03/26/2020   Last metabolic panel Lab Results  Component Value Date   GLUCOSE 101 (H) 12/20/2021   NA 142 12/20/2021   K 4.3 12/20/2021   CL 105 12/20/2021   CO2 29 12/20/2021   BUN 19 12/20/2021   CREATININE 0.89 12/20/2021   GFR 66.95 12/20/2021   CALCIUM 9.6 12/20/2021   PROT 7.0 12/20/2021   ALBUMIN 4.4 12/20/2021   BILITOT 0.4 12/20/2021   ALKPHOS 54 12/20/2021   AST 15 12/20/2021   ALT 14 12/20/2021   ANIONGAP 11 03/13/2019   Last lipids Lab Results  Component Value Date   CHOL 184 03/26/2020   HDL 71.40 03/26/2020   LDLCALC 92 03/26/2020   TRIG 106.0 03/26/2020   CHOLHDL 3 03/26/2020   Last hemoglobin A1c No results found for: "HGBA1C" Last thyroid functions Lab Results  Component Value Date   TSH 1.50 09/28/2014   Last vitamin D No results found for: "25OHVITD2", "25OHVITD3", "VD25OH" Last vitamin B12 and Folate Lab Results  Component Value Date   VITAMINB12 126 (L) 01/27/2014      The 10-year ASCVD risk score (Arnett DK, et al., 2019) is: 7.5%    Assessment & Plan:   Problem List Items Addressed This Visit       Unprioritized   Attention deficit disorder   Preventative health care - Primary    [facility]--  pt refuses any screenings or vaccines  Health Maintenance  Topic Date Due   COVID-19 Vaccine (1) Never done   DTaP/Tdap/Td (1 - Tdap) Never done   Zoster Vaccines- Shingrix (1 of 2) 04/04/1973   MAMMOGRAM  06/04/2015   Pneumonia Vaccine 13+ Years old (1 of 1 - PCV) Never done   DEXA SCAN  Never done   Colonoscopy  01/10/2020   Medicare Annual Wellness (AWV)  04/13/2022   INFLUENZA VACCINE   08/10/2022   Hepatitis C Screening  Completed   HPV VACCINES  Aged Out         Depression with anxiety    Con't wellbutrin Add pristiq 50 mg daily and f/u 1 month or sooner prn      Relevant Medications   desvenlafaxine (PRISTIQ) 50 MG 24 hr tablet   Other Relevant Orders   TSH   Attention deficit disorder (ADD) in adult    Inc vyvanse to 70 mg daily  F/u 1 month      Other Visit Diagnoses     Ischemic heart disease screen       Relevant Orders   CBC with Differential/Platelet   Comprehensive metabolic panel   Lipid panel   TSH     Assessment and Plan  Depression: Patient reports feeling that current regimen of Wellbutrin and Vyvanse may not be effective anymore. She is experiencing significant emotional distress related to familial estrangement. Previous trial of Lexapro resulted in undesirable side effects. -Discontinue Wellbutrin. -Start Pristiq, beginning with the lower dose. -Check response to Pristiq in 1 month via virtual visit.  ADHD: Patient on Vyvanse, requesting increase in dose. -Increase Vyvanse to 70mg  daily.  Insomnia: Patient reports difficulty sleeping, diazepam not effective for sleep. -Consider trial of over-the-counter relaxium.  General Health Maintenance: -Declines recommended screenings (shingles vaccine, mammogram, colonoscopy).  Pending Foot Surgery: Patient has a foot condition requiring surgery by Dr. Luiz Blare. -Continue current pain management regimen as needed.        Return in about 4 weeks (around 08/28/2022), or if symptoms worsen or fail to improve, for depression.    Donato Schultz, DO

## 2022-07-31 NOTE — Assessment & Plan Note (Signed)
Con't wellbutrin Add pristiq 50 mg daily and f/u 1 month or sooner prn

## 2022-07-31 NOTE — Assessment & Plan Note (Signed)
[facility]--  pt refuses any screenings or vaccines  Health Maintenance  Topic Date Due   COVID-19 Vaccine (1) Never done   DTaP/Tdap/Td (1 - Tdap) Never done   Zoster Vaccines- Shingrix (1 of 2) 04/04/1973   MAMMOGRAM  06/04/2015   Pneumonia Vaccine 70+ Years old (1 of 1 - PCV) Never done   DEXA SCAN  Never done   Colonoscopy  01/10/2020   Medicare Annual Wellness (AWV)  04/13/2022   INFLUENZA VACCINE  08/10/2022   Hepatitis C Screening  Completed   HPV VACCINES  Aged Out

## 2022-07-31 NOTE — Patient Instructions (Signed)
Preventive Care 65 Years and Older, Female Preventive care refers to lifestyle choices and visits with your health care provider that can promote health and wellness. Preventive care visits are also called wellness exams. What can I expect for my preventive care visit? Counseling Your health care provider may ask you questions about your: Medical history, including: Past medical problems. Family medical history. Pregnancy and menstrual history. History of falls. Current health, including: Memory and ability to understand (cognition). Emotional well-being. Home life and relationship well-being. Sexual activity and sexual health. Lifestyle, including: Alcohol, nicotine or tobacco, and drug use. Access to firearms. Diet, exercise, and sleep habits. Work and work environment. Sunscreen use. Safety issues such as seatbelt and bike helmet use. Physical exam Your health care provider will check your: Height and weight. These may be used to calculate your BMI (body mass index). BMI is a measurement that tells if you are at a healthy weight. Waist circumference. This measures the distance around your waistline. This measurement also tells if you are at a healthy weight and may help predict your risk of certain diseases, such as type 2 diabetes and high blood pressure. Heart rate and blood pressure. Body temperature. Skin for abnormal spots. What immunizations do I need?  Vaccines are usually given at various ages, according to a schedule. Your health care provider will recommend vaccines for you based on your age, medical history, and lifestyle or other factors, such as travel or where you work. What tests do I need? Screening Your health care provider may recommend screening tests for certain conditions. This may include: Lipid and cholesterol levels. Hepatitis C test. Hepatitis B test. HIV (human immunodeficiency virus) test. STI (sexually transmitted infection) testing, if you are at  risk. Lung cancer screening. Colorectal cancer screening. Diabetes screening. This is done by checking your blood sugar (glucose) after you have not eaten for a while (fasting). Mammogram. Talk with your health care provider about how often you should have regular mammograms. BRCA-related cancer screening. This may be done if you have a family history of breast, ovarian, tubal, or peritoneal cancers. Bone density scan. This is done to screen for osteoporosis. Talk with your health care provider about your test results, treatment options, and if necessary, the need for more tests. Follow these instructions at home: Eating and drinking  Eat a diet that includes fresh fruits and vegetables, whole grains, lean protein, and low-fat dairy products. Limit your intake of foods with high amounts of sugar, saturated fats, and salt. Take vitamin and mineral supplements as recommended by your health care provider. Do not drink alcohol if your health care provider tells you not to drink. If you drink alcohol: Limit how much you have to 0-1 drink a day. Know how much alcohol is in your drink. In the [place] one drink equals one 12 oz bottle of beer (355 mL), one 5 oz glass of wine (148 mL), or one 1 oz glass of hard liquor (44 mL). Lifestyle Brush your teeth every morning and night with fluoride toothpaste. Floss one time each day. Exercise for at least 30 minutes 5 or more days each week. Do not use any products that contain nicotine or tobacco. These products include cigarettes, chewing tobacco, and vaping devices, such as e-cigarettes. If you need help quitting, ask your health care provider. Do not use drugs. If you are sexually active, practice safe sex. Use a condom or other form of protection in order to prevent STIs. Take aspirin only as told by   your health care provider. Make sure that you understand how much to take and what form to take. Work with your health care provider to find out whether it  is safe and beneficial for you to take aspirin daily. Ask your health care provider if you need to take a cholesterol-lowering medicine (statin). Find healthy ways to manage stress, such as: Meditation, yoga, or listening to music. Journaling. Talking to a trusted person. Spending time with friends and family. Minimize exposure to UV radiation to reduce your risk of skin cancer. Safety Always wear your seat belt while driving or riding in a vehicle. Do not drive: If you have been drinking alcohol. Do not ride with someone who has been drinking. When you are tired or distracted. While texting. If you have been using any mind-altering substances or drugs. Wear a helmet and other protective equipment during sports activities. If you have firearms in your house, make sure you follow all gun safety procedures. What's next? Visit your health care provider once a year for an annual wellness visit. Ask your health care provider how often you should have your eyes and teeth checked. Stay up to date on all vaccines. This information is not intended to replace advice given to you by your health care provider. Make sure you discuss any questions you have with your health care provider. Document Revised: 06/23/2020 Document Reviewed: 06/23/2020 Elsevier Patient Education  2024 Elsevier Inc.  

## 2022-08-01 ENCOUNTER — Telehealth: Payer: Self-pay | Admitting: Family Medicine

## 2022-08-01 NOTE — Telephone Encounter (Signed)
Copied from CRM [id number]. Topic: Medicare AWV >> Aug 01, 2022  1:43 PM Payton Doughty wrote: Reason for CRM: LM 08/01/2022 to schedule AWV   Verlee Rossetti; Care Guide Ambulatory Clinical Support [place] [facility] Direct Dial: [phone]

## 2022-08-03 DIAGNOSIS — M79671 Pain in right foot: Secondary | ICD-10-CM | POA: Diagnosis not present

## 2022-08-03 DIAGNOSIS — M79672 Pain in left foot: Secondary | ICD-10-CM | POA: Diagnosis not present

## 2022-08-03 DIAGNOSIS — S92355A Nondisplaced fracture of fifth metatarsal bone, left foot, initial encounter for closed fracture: Secondary | ICD-10-CM | POA: Diagnosis not present

## 2022-08-07 DIAGNOSIS — M7661 Achilles tendinitis, right leg: Secondary | ICD-10-CM | POA: Diagnosis not present

## 2022-08-07 DIAGNOSIS — M216X1 Other acquired deformities of right foot: Secondary | ICD-10-CM | POA: Diagnosis not present

## 2022-08-28 ENCOUNTER — Encounter: Payer: Self-pay | Admitting: Family Medicine

## 2022-08-28 DIAGNOSIS — G4700 Insomnia, unspecified: Secondary | ICD-10-CM

## 2022-08-28 NOTE — Telephone Encounter (Signed)
Requesting: Valium Contract: 2022 UDS: 2022 Last OV: 07/31/22 Next OV: 09/04/22 Last Refill: 06/29/22, #30--1 RF Database:   Requesting: Vyvanse Contract: UDS: Last OV: Next OV: Last Refill: 07/31/22, #30--0 RF Database:   Please advise

## 2022-08-29 MED ORDER — LISDEXAMFETAMINE DIMESYLATE 70 MG PO CAPS: 70.0000 mg | ORAL_CAPSULE | Freq: Every day | ORAL | 0 refills | Status: DC

## 2022-08-29 MED ORDER — DIAZEPAM 5 MG PO TABS: ORAL_TABLET | ORAL | 1 refills | Status: DC

## 2022-09-04 ENCOUNTER — Telehealth (INDEPENDENT_AMBULATORY_CARE_PROVIDER_SITE_OTHER): Payer: Medicare HMO | Admitting: Family Medicine

## 2022-09-04 ENCOUNTER — Encounter: Payer: Self-pay | Admitting: Family Medicine

## 2022-09-04 DIAGNOSIS — F418 Other specified anxiety disorders: Secondary | ICD-10-CM

## 2022-09-04 DIAGNOSIS — F988 Other specified behavioral and emotional disorders with onset usually occurring in childhood and adolescence: Secondary | ICD-10-CM | POA: Diagnosis not present

## 2022-09-04 DIAGNOSIS — G4700 Insomnia, unspecified: Secondary | ICD-10-CM

## 2022-09-04 MED ORDER — DIAZEPAM 5 MG PO TABS: ORAL_TABLET | ORAL | 0 refills | Status: DC

## 2022-09-04 MED ORDER — LISDEXAMFETAMINE DIMESYLATE 70 MG PO CAPS: 70.0000 mg | ORAL_CAPSULE | Freq: Every day | ORAL | 0 refills | Status: DC

## 2022-09-04 NOTE — Progress Notes (Addendum)
Established Patient Office Visit  Subjective   Patient ID: Theresa [name], female    DOB: 12/11/1955  Age: 67 y.o. MRN: [id number]  Chief Complaint  Patient presents with   Depression   Follow-up    HPI Discussed the use of AI scribe software for clinical note transcription with the patient, who gave verbal consent to proceed.  History of Present Illness   The patient, with a history of depression and insomnia, reports that the combination of Vyvanse 70mg  and Wellbutrin is effective. They discontinued Pristiq due to it being 'too much.' They request refills for Vyvanse and Diazepam, the latter of which is used for insomnia. Over the counter sleep aids were ineffective and left them feeling drowsy. They deny any other issues and report feeling 'pretty good. '.     I connected with  Tonnya A Muenchow on 09/20/22 by a video enabled telemedicine application and verified that I am speaking with the correct person using two identifiers.   I discussed the limitations of evaluation and management by telemedicine. The patient expressed understanding and agreed to proceed.  Patient Active Problem List   Diagnosis Date Noted   Preventative health care 07/31/2022   Acute medial meniscal tear, left, initial encounter 06/15/2022   Acute lateral meniscal tear, left, initial encounter 06/15/2022   Chondromalacia, left knee 06/15/2022   Onychomycosis 10/10/2021   Radiculopathy 08/01/2018   Right arm pain 03/07/2018   Attention deficit disorder (ADD) in adult 03/07/2018   Slow transit constipation 03/07/2018   Attention deficit disorder 07/06/2016   Lower extremity edema 07/06/2016   Sebaceous cyst 03/19/2016   Toenail bruise 05/25/2014   OSA (obstructive sleep apnea) 02/17/2013   Fibromyalgia 12/27/2012   Migraine with aura 12/27/2012   Seasonal allergies 12/27/2012   HX: breast cancer 12/27/2012   GERD (gastroesophageal reflux disease) 12/27/2012   HTN (hypertension) 12/27/2012   Obesity  (BMI 30-39.9) 12/27/2012   Depression with anxiety 12/27/2012   Past Medical History:  Diagnosis Date   ADD (attention deficit disorder)    Anemia    Arthritis    neck   Breast cancer (HCC)    Left breast   Complication of anesthesia    1 time "I felt everything" - approx 2005, - or "maybe 1986 or 2010- not sure"   Constipation    probotic    Depression    Diverticulitis    Family history of adverse reaction to anesthesia    other  and PG- wake up during surgey   Fibromyalgia    GERD (gastroesophageal reflux disease)    occ   History of blood transfusion    History of stomach ulcers    Hypertension    "white coat syndrome"   Migraines    migraine since age 77    Pleurisy    Pneumonia    age 74   PONV (postoperative nausea and vomiting)    Urinary incontinence    Past Surgical History:  Procedure Laterality Date   ANTERIOR CERVICAL DECOMPRESSION/DISCECTOMY FUSION 4 LEVELS N/A 08/01/2018   Procedure: ANTERIOR CERVICAL DECOMPRESSION FUSION, CERVICAL FOUR TO FIVE, CERVICAL FIVE TO SIX , CERVICAL SIX TO SEVEN WITH INSTRUMENTATION AND ALLOGRAFT;  Surgeon: Estill Bamberg, MD;  Location: [facility] OR;  Service: Orthopedics;  Laterality: N/A;   APPENDECTOMY  1960   BREAST BIOPSY Left 1985   BREAST RECONSTRUCTION  1986 and 2010   x's 2   CHOLECYSTECTOMY  1987   COLONOSCOPY     KNEE ARTHROSCOPY WITH  MEDIAL MENISECTOMY Left 06/15/2022   Procedure: KNEE ARTHROSCOPY WITH PARTIAL MEDIAL AND PARTIAL LATERAL MENISECTOMY, CHONDROPLASTY MEDIAL AND LATERAL;  Surgeon: Jodi Geralds, MD;  Location: [facility];  Service: Orthopedics;  Laterality: Left;   LASIK  2012   MASTECTOMY Left 1985   Social History   Tobacco Use   Smoking status: Never   Smokeless tobacco: Never  Vaping Use   Vaping status: Never Used  Substance Use Topics   Alcohol use: Yes    Comment: occasionally   Drug use: No   Social History   Socioeconomic History   Marital status: Married    Spouse name:  Not on file   Number of children: Not on file   Years of education: Not on file   Highest education level: Not on file  Occupational History   Occupation: Engineer, site: [employer]  Tobacco Use   Smoking status: Never   Smokeless tobacco: Never  Vaping Use   Vaping status: Never Used  Substance and Sexual Activity   Alcohol use: Yes    Comment: occasionally   Drug use: No   Sexual activity: Yes  Other Topics Concern   Not on file  Social History Narrative   Not on file   Social Determinants of Health   Financial Resource Strain: Low Risk  (04/12/2021)   Overall Financial Resource Strain (CARDIA)    Difficulty of Paying Living Expenses: Not hard at all  Food Insecurity: No Food Insecurity (04/12/2021)   Hunger Vital Sign    Worried About Running Out of Food in the Last Year: Never true    Ran Out of Food in the Last Year: Never true  Transportation Needs: No Transportation Needs (04/12/2021)   PRAPARE - Administrator, Civil Service (Medical): No    Lack of Transportation (Non-Medical): No  Physical Activity: Not on file  Stress: No Stress Concern Present (04/12/2021)   [employer] of Occupational Health - Occupational Stress Questionnaire    Feeling of Stress : Not at all  Social Connections: Unknown (05/20/2021)   Received from [facility]   Social Network    Social Network: Not on file  Intimate Partner Violence: Unknown (04/12/2021)   [facility]   HITS    Physically Hurt: Not on file    Insult or Talk Down To: Not on file    Threaten Physical Harm: Not on file    Scream or Curse: Not on file   Family Status  Relation Name Status   Mother  Alive   Father  Deceased   MGM  (Not Specified)  No partnership data on file   Family History  Problem Relation Age of Onset   Breast cancer Mother    Depression Mother    Heart failure Father    Depression Father    Arthritis  Maternal Grandmother    Allergies  Allergen Reactions   Nitroglycerin     Migraine   Oxycodone Itching      Review of Systems  Constitutional:  Negative for fever and malaise/fatigue.  HENT:  Negative for congestion.   Eyes:  Negative for blurred vision.  Respiratory:  Negative for shortness of breath.   Cardiovascular:  Negative for chest pain, palpitations and leg swelling.  Gastrointestinal:  Negative for abdominal pain, blood in stool and nausea.  Genitourinary:  Negative for dysuria and frequency.  Musculoskeletal:  Negative for falls.  Skin:  Negative for rash.  Neurological:  Negative for dizziness, loss of consciousness and headaches.  Endo/Heme/Allergies:  Negative for environmental allergies.  Psychiatric/Behavioral:  Positive for depression. The patient is nervous/anxious.       Objective:     There were no vitals taken for this visit. BP Readings from Last 3 Encounters:  07/31/22 (!) 134/90  06/15/22 (!) 160/84  01/27/22 124/86   Wt Readings from Last 3 Encounters:  07/31/22 195 lb 6.4 oz (88.6 kg)  06/15/22 191 lb 2.2 oz (86.7 kg)  01/27/22 188 lb 6.4 oz (85.5 kg)   SpO2 Readings from Last 3 Encounters:  07/31/22 97%  06/15/22 95%  01/27/22 97%      Physical Exam Vitals and nursing note reviewed.  Constitutional:      General: She is not in acute distress.    Appearance: Normal appearance. She is well-developed.  Neck:     Thyroid: No thyromegaly or thyroid tenderness.     Vascular: No JVD.  Neurological:     Mental Status: She is alert and oriented to person, place, and time.     Deep Tendon Reflexes: Reflexes are normal and symmetric.  Psychiatric:        Mood and Affect: Mood is anxious and depressed.        Behavior: Behavior normal.        Thought Content: Thought content normal.        Judgment: Judgment normal.      No results found for any visits on 09/04/22.  Last CBC Lab Results  Component Value Date   WBC 4.7 07/31/2022    HGB 12.4 07/31/2022   HCT 39.1 07/31/2022   MCV 84.3 07/31/2022   MCH 27.0 03/13/2019   RDW 16.2 (H) 07/31/2022   PLT 270.0 07/31/2022   Last metabolic panel Lab Results  Component Value Date   GLUCOSE 88 07/31/2022   NA 141 07/31/2022   K 4.3 07/31/2022   CL 106 07/31/2022   CO2 26 07/31/2022   BUN 21 07/31/2022   CREATININE 0.89 07/31/2022   GFR 66.66 07/31/2022   CALCIUM 9.2 07/31/2022   PROT 6.5 07/31/2022   ALBUMIN 4.1 07/31/2022   BILITOT 0.3 07/31/2022   ALKPHOS 63 07/31/2022   AST 14 07/31/2022   ALT 12 07/31/2022   ANIONGAP 11 03/13/2019   Last lipids Lab Results  Component Value Date   CHOL 190 07/31/2022   HDL 72.10 07/31/2022   LDLCALC 91 07/31/2022   TRIG 137.0 07/31/2022   CHOLHDL 3 07/31/2022   Last hemoglobin A1c No results found for: "HGBA1C" Last thyroid functions Lab Results  Component Value Date   TSH 2.32 07/31/2022   Last vitamin D No results found for: "25OHVITD2", "25OHVITD3", "VD25OH" Last vitamin B12 and Folate Lab Results  Component Value Date   VITAMINB12 126 (L) 01/27/2014      The 10-year ASCVD risk score (Arnett DK, et al., 2019) is: 7.6%    Assessment & Plan:   Problem List Items Addressed This Visit       Unprioritized   Attention deficit disorder - Primary   Relevant Medications   lisdexamfetamine (VYVANSE) 70 MG capsule   Depression with anxiety    Con't wellbutrin  stable      Relevant Medications   diazepam (VALIUM) 5 MG tablet   Attention deficit disorder (ADD) in adult    Stable on vyvanse F/u 6 months       Other Visit Diagnoses     Insomnia,  unspecified type       Relevant Medications   diazepam (VALIUM) 5 MG tablet      Assessment and Plan    Depression Stable on Wellbutrin and Vyvanse. Discontinued Pristiq due to feeling it was too much. -Continue Wellbutrin and Vyvanse. -Refill Vyvanse prescription.  Insomnia Over the counter sleep aids leave patient feeling drowsy. Diazepam has  been effective. -Refill Diazepam prescription.  Follow up in six months.       No follow-ups on file.    Donato Schultz, DO

## 2022-09-15 ENCOUNTER — Encounter: Payer: Self-pay | Admitting: Family Medicine

## 2022-09-15 DIAGNOSIS — F418 Other specified anxiety disorders: Secondary | ICD-10-CM

## 2022-09-15 MED ORDER — BUPROPION HCL ER (XL) 300 MG PO TB24
300.0000 mg | ORAL_TABLET | Freq: Every day | ORAL | 1 refills | Status: DC
Start: 2022-09-15 — End: 2023-02-12

## 2022-09-20 NOTE — Assessment & Plan Note (Signed)
Con't wellbutrin  stable

## 2022-09-20 NOTE — Assessment & Plan Note (Signed)
Stable on vyvanse F/u 6 months

## 2022-09-29 ENCOUNTER — Encounter: Payer: Self-pay | Admitting: Family Medicine

## 2022-09-29 DIAGNOSIS — F988 Other specified behavioral and emotional disorders with onset usually occurring in childhood and adolescence: Secondary | ICD-10-CM

## 2022-10-02 MED ORDER — LISDEXAMFETAMINE DIMESYLATE 70 MG PO CAPS
70.0000 mg | ORAL_CAPSULE | Freq: Every day | ORAL | 0 refills | Status: AC
Start: 2022-10-02 — End: ?

## 2022-10-02 NOTE — Addendum Note (Signed)
Addended by: Roxanne Gates on: 10/02/2022 01:27 PM   Modules accepted: Orders

## 2022-10-09 ENCOUNTER — Other Ambulatory Visit: Payer: Self-pay | Admitting: Family Medicine

## 2022-10-09 ENCOUNTER — Telehealth: Payer: Self-pay | Admitting: Family Medicine

## 2022-10-09 ENCOUNTER — Encounter: Payer: Self-pay | Admitting: Family Medicine

## 2022-10-09 ENCOUNTER — Other Ambulatory Visit: Payer: Self-pay | Admitting: Family

## 2022-10-09 DIAGNOSIS — F988 Other specified behavioral and emotional disorders with onset usually occurring in childhood and adolescence: Secondary | ICD-10-CM

## 2022-10-09 MED ORDER — LISDEXAMFETAMINE DIMESYLATE 70 MG PO CAPS: 70.0000 mg | ORAL_CAPSULE | Freq: Every day | ORAL | 0 refills | Status: DC

## 2022-10-09 NOTE — Telephone Encounter (Signed)
Pt called and stated that [facility] will not be able to provide her with her medication, vyvanse, until tomorrow night. Pt mentioned that she is going out of town tomorrow at 6 am. She would like a short supply of the medication to be sent in, as she will be out of town. Pt requested to speak with nurse via telephone regarding this matter. Please call & advise pt.

## 2022-10-09 NOTE — Telephone Encounter (Signed)
**  Pt is requesting a short order of a week for her trip. Pt will be going out of town tomorrow and needs this called in today. Pt would like a call back to indicate it has been sent. Routed HP due to time sensitivity.**  Prescription Request  10/09/2022  Is this a "Controlled Substance" medicine? Yes  LOV: 07/31/2022  What is the name of the medication or equipment?   lisdexamfetamine (VYVANSE) 70 MG capsule [[id number]]  Have you contacted your pharmacy to request a refill? No   Which pharmacy would you like this sent to?    [facility] [address] [place], [place], [place] [address] P: [phone]  Patient notified that their request is being sent to the clinical staff for review and that they should receive a response within 2 business days.   Please advise at Mobile [phone] (mobile)

## 2022-10-09 NOTE — Telephone Encounter (Signed)
I informed pt that our nurses are waiting for a response from the covering provider.

## 2022-10-09 NOTE — Telephone Encounter (Signed)
Pt called back & stated that she was informed that no pharmacies within a 30 mi radius has her medication in stock. She would like to speak with nurse regarding what other medication she can take and for a short supply to be sent in to cover her vacation. Pt stated that she really needs her medication as she will be leaving out of town tomorrow at 6 am. Please call and speak with pt.

## 2022-10-10 ENCOUNTER — Other Ambulatory Visit: Payer: Self-pay | Admitting: Family Medicine

## 2022-10-10 ENCOUNTER — Encounter: Payer: Self-pay | Admitting: Family Medicine

## 2022-10-10 DIAGNOSIS — F988 Other specified behavioral and emotional disorders with onset usually occurring in childhood and adolescence: Secondary | ICD-10-CM

## 2022-10-10 MED ORDER — METHYLPHENIDATE HCL ER (OSM) 27 MG PO TBCR: 27.0000 mg | EXTENDED_RELEASE_TABLET | ORAL | 0 refills | Status: DC

## 2022-10-10 MED ORDER — LISDEXAMFETAMINE DIMESYLATE 70 MG PO CAPS: 70.0000 mg | ORAL_CAPSULE | Freq: Every day | ORAL | 0 refills | Status: DC

## 2022-10-10 NOTE — Telephone Encounter (Signed)
See mychart messages

## 2022-10-10 NOTE — Telephone Encounter (Signed)
I just spoke with Theresa [name] who [place] the [place]. I advised her that we were not made aware that she did not have her prescription through [facility] until 3:51 PM yesterday. She became very argumentative. Stated that she "had done all the legwork." She stated our dates" did not match up, and all she wanted was her medication." I advised [name] that I did not appreciate her attacking our staff via MyChart; this is not the first time she has been frustrated and had trouble with her prescription. I stated that the staff continually bends backward to care for her and that she needed to be mindful of her word choices in her MyChart messages. She told me that she would not argue with me and that all she" wanted was her medicine so she could enjoy her vacation."   She is asking that the medication sent into the [facility] at [address]. [place]

## 2022-10-12 ENCOUNTER — Telehealth: Payer: Self-pay | Admitting: Family Medicine

## 2022-10-12 NOTE — Telephone Encounter (Signed)
The nurse from [facility], April, called and stated that they faxed over documentation on 9/26 for the pt to receive a bone density test. Please advise with [facility] as she stated they need documentation as soon as possible for the pt's osteoporosis management. Please advise at [phone].

## 2022-10-13 ENCOUNTER — Other Ambulatory Visit: Payer: Self-pay | Admitting: Family Medicine

## 2022-10-13 DIAGNOSIS — Z1212 Encounter for screening for malignant neoplasm of rectum: Secondary | ICD-10-CM

## 2022-10-13 DIAGNOSIS — Z1211 Encounter for screening for malignant neoplasm of colon: Secondary | ICD-10-CM

## 2022-10-18 NOTE — Telephone Encounter (Signed)
Fax sent again yesterday

## 2022-11-09 ENCOUNTER — Encounter: Payer: Self-pay | Admitting: Family Medicine

## 2022-11-09 DIAGNOSIS — F988 Other specified behavioral and emotional disorders with onset usually occurring in childhood and adolescence: Secondary | ICD-10-CM

## 2022-11-14 MED ORDER — LISDEXAMFETAMINE DIMESYLATE 70 MG PO CAPS: 70.0000 mg | ORAL_CAPSULE | Freq: Every day | ORAL | 0 refills | Status: DC

## 2022-11-14 NOTE — Telephone Encounter (Signed)
Requesting: Vyvanse Contract: 2022 UDS: 2022 Last OV: 09/04/22 Next OV: n/a Last Refill: 10/10/22, #30--0 RF Database:   Please advise

## 2022-11-14 NOTE — Addendum Note (Signed)
Addended by: Roxanne Gates on: 11/14/2022 11:29 AM   Modules accepted: Orders

## 2022-11-16 ENCOUNTER — Other Ambulatory Visit: Payer: Self-pay | Admitting: Family Medicine

## 2022-11-16 ENCOUNTER — Encounter: Payer: Self-pay | Admitting: Family Medicine

## 2022-11-16 DIAGNOSIS — J302 Other seasonal allergic rhinitis: Secondary | ICD-10-CM

## 2022-11-16 DIAGNOSIS — F988 Other specified behavioral and emotional disorders with onset usually occurring in childhood and adolescence: Secondary | ICD-10-CM

## 2022-11-16 MED ORDER — LISDEXAMFETAMINE DIMESYLATE 70 MG PO CAPS: 70.0000 mg | ORAL_CAPSULE | Freq: Every day | ORAL | 0 refills | Status: DC

## 2022-11-16 NOTE — Telephone Encounter (Signed)
Per patient  [facility] is out of Vyvanse 70mg    Please send an Rx [facility] [place] for a 7 day supply of Vyvanse 70mg  (they do have in stock as of this morning)   Also please send in an updated Rx refill to [facility], [facility] for 90 day supply of Vyvanse 70mg  as I am now able to set up a monthly payment plan for this prescription. But i cannot set up payment plan without updated Rx refill request.

## 2022-11-17 ENCOUNTER — Telehealth: Payer: Self-pay | Admitting: Family Medicine

## 2022-11-17 ENCOUNTER — Other Ambulatory Visit: Payer: Self-pay | Admitting: Family Medicine

## 2022-11-17 ENCOUNTER — Encounter: Payer: Self-pay | Admitting: Family Medicine

## 2022-11-17 DIAGNOSIS — F988 Other specified behavioral and emotional disorders with onset usually occurring in childhood and adolescence: Secondary | ICD-10-CM

## 2022-11-17 MED ORDER — LISDEXAMFETAMINE DIMESYLATE 70 MG PO CAPS: 70.0000 mg | ORAL_CAPSULE | Freq: Every day | ORAL | 0 refills | Status: DC

## 2022-11-17 NOTE — Telephone Encounter (Signed)
See phone note

## 2022-11-17 NOTE — Telephone Encounter (Addendum)
Pt called and stated [facility] [place] [facility] not [facility] her medication, lisdexamfetamine (VYVANSE) 70 MG capsule, In stock and needs it sent to the [facility] [place] Address: [address] [place], [place], [place] [address] Phone: [phone].  Please call and advise.

## 2022-11-17 NOTE — Telephone Encounter (Signed)
Pharmacy called and medication was cancelled at [facility].

## 2022-11-30 ENCOUNTER — Other Ambulatory Visit: Payer: Self-pay | Admitting: Family Medicine

## 2022-11-30 DIAGNOSIS — K2970 Gastritis, unspecified, without bleeding: Secondary | ICD-10-CM

## 2022-12-11 ENCOUNTER — Other Ambulatory Visit: Payer: Self-pay | Admitting: Family Medicine

## 2022-12-11 ENCOUNTER — Encounter: Payer: Self-pay | Admitting: Family Medicine

## 2022-12-11 DIAGNOSIS — F988 Other specified behavioral and emotional disorders with onset usually occurring in childhood and adolescence: Secondary | ICD-10-CM

## 2022-12-11 DIAGNOSIS — H5213 Myopia, bilateral: Secondary | ICD-10-CM | POA: Diagnosis not present

## 2022-12-11 MED ORDER — LISDEXAMFETAMINE DIMESYLATE 70 MG PO CAPS
70.0000 mg | ORAL_CAPSULE | Freq: Every day | ORAL | 0 refills | Status: DC
Start: 2022-12-11 — End: 2023-03-22

## 2022-12-11 MED ORDER — LISDEXAMFETAMINE DIMESYLATE 70 MG PO CAPS: 70.0000 mg | ORAL_CAPSULE | Freq: Every day | ORAL | 0 refills | Status: DC

## 2022-12-11 NOTE — Telephone Encounter (Signed)
Called patient and LVM for her to call back and get scheduled for a f/u for med refills  Will send to [facility] again for correction on meds.

## 2022-12-18 DIAGNOSIS — H1031 Unspecified acute conjunctivitis, right eye: Secondary | ICD-10-CM | POA: Diagnosis not present

## 2022-12-18 DIAGNOSIS — H5203 Hypermetropia, bilateral: Secondary | ICD-10-CM | POA: Diagnosis not present

## 2022-12-18 DIAGNOSIS — H524 Presbyopia: Secondary | ICD-10-CM | POA: Diagnosis not present

## 2023-01-12 ENCOUNTER — Telehealth: Payer: Medicare HMO | Admitting: Family Medicine

## 2023-01-15 ENCOUNTER — Telehealth (INDEPENDENT_AMBULATORY_CARE_PROVIDER_SITE_OTHER): Payer: Medicare HMO | Admitting: Family Medicine

## 2023-01-15 ENCOUNTER — Encounter: Payer: Self-pay | Admitting: Family Medicine

## 2023-01-15 DIAGNOSIS — G4700 Insomnia, unspecified: Secondary | ICD-10-CM

## 2023-01-15 DIAGNOSIS — F988 Other specified behavioral and emotional disorders with onset usually occurring in childhood and adolescence: Secondary | ICD-10-CM

## 2023-01-15 MED ORDER — DIAZEPAM 5 MG PO TABS: ORAL_TABLET | ORAL | Status: DC

## 2023-01-15 MED ORDER — TRAZODONE HCL 50 MG PO TABS
ORAL_TABLET | ORAL | 3 refills | Status: DC
Start: 2023-01-15 — End: 2023-02-15

## 2023-01-15 NOTE — Assessment & Plan Note (Signed)
 Stable No side effects from medication Con't vyvanse 70 mg daily  F/u 6 months or sooner prn

## 2023-01-15 NOTE — Progress Notes (Signed)
 MyChart Video Visit    Virtual Visit via Video Note   This patient is at least at moderate risk for complications without adequate follow up. This format is felt to be most appropriate for this patient at this time. Physical exam was limited by quality of the video and audio technology used for the visit. [name] was able to get the patient set up on a video visit.  Patient location: Home  Patient and provider in visit Provider location: Office  I discussed the limitations of evaluation and management by telemedicine and the availability of in person appointments. The patient expressed understanding and agreed to proceed.  Visit Date: 01/15/2023  Today's healthcare provider: Jamee JONELLE Antonio Cyndee, DO     Subjective:    Patient ID: Theresa [name], female    DOB: Apr 22, 1955, 68 y.o.   MRN: [id number]  Chief Complaint  Patient presents with   Medication Refill    Called patient on both numbers to try to room and connect. Patient did not answer- but looks like she is connected    HPI Patient is in today for f/u add and anxiety.   Discussed the use of AI scribe software for clinical note transcription with the patient, who gave verbal consent to proceed.  History of Present Illness   The patient, with a history of anxiety and insomnia, reports a recent change in her medication regimen due to financial constraints. She has been taking Trazodone  50mg  and Diazepam  5mg  for sleep, but found that this combination left her feeling drowsy in the morning. She experimented with taking Trazodone  100mg  alone, which resulted in waking up feeling more normal. She requests a change in her prescription to Trazodone  100mg , without the Diazepam .  The patient also reports a history of panic attacks, describing the sensation as feeling like swallowing glass. She has had approximately five panic attacks in the past five years, two of which resulted in hospital visits due to the severity of the symptoms.  Diazepam  has been effective in managing these episodes, but the patient does not feel the need to take it daily. She requests to have Diazepam  prescribed as needed for potential future panic attacks.  In addition to her mental health concerns, the patient occasionally experiences joint pain, for which she takes ibuprofen as needed. She reports no other health issues at this time.       Past Medical History:  Diagnosis Date   ADD (attention deficit disorder)    Anemia    Arthritis    neck   Breast cancer (HCC)    Left breast   Complication of anesthesia    1 time I felt everything - approx 2005, - or maybe 1986 or 2010- not sure   Constipation    probotic    Depression    Diverticulitis    Family history of adverse reaction to anesthesia    other  and PG- wake up during surgey   Fibromyalgia    GERD (gastroesophageal reflux disease)    occ   History of blood transfusion    History of stomach ulcers    Hypertension    white coat syndrome   Migraines    migraine since age 47    Pleurisy    Pneumonia    age 79   PONV (postoperative nausea and vomiting)    Urinary incontinence     Past Surgical History:  Procedure Laterality Date   ANTERIOR CERVICAL DECOMPRESSION/DISCECTOMY FUSION 4 LEVELS N/A 08/01/2018  Procedure: ANTERIOR CERVICAL DECOMPRESSION FUSION, CERVICAL FOUR TO FIVE, CERVICAL FIVE TO SIX , CERVICAL SIX TO SEVEN WITH INSTRUMENTATION AND ALLOGRAFT;  Surgeon: Beuford Anes, MD;  Location: [facility] OR;  Service: Orthopedics;  Laterality: N/A;   APPENDECTOMY  1960   BREAST BIOPSY Left 1985   BREAST RECONSTRUCTION  1986 and 2010   x's 2   CHOLECYSTECTOMY  1987   COLONOSCOPY     KNEE ARTHROSCOPY WITH MEDIAL MENISECTOMY Left 06/15/2022   Procedure: KNEE ARTHROSCOPY WITH PARTIAL MEDIAL AND PARTIAL LATERAL MENISECTOMY, CHONDROPLASTY MEDIAL AND LATERAL;  Surgeon: Yvone Rush, MD;  Location: [facility];  Service: Orthopedics;  Laterality: Left;   LASIK  2012    MASTECTOMY Left 1985    Family History  Problem Relation Age of Onset   Breast cancer Mother    Depression Mother    Heart failure Father    Depression Father    Arthritis Maternal Grandmother     Social History   Socioeconomic History   Marital status: Married    Spouse name: Not on file   Number of children: Not on file   Years of education: Not on file   Highest education level: Not on file  Occupational History   Occupation: Psychologist, counselling    Employer: [employer]  Tobacco Use   Smoking status: Never   Smokeless tobacco: Never  Vaping Use   Vaping status: Never Used  Substance and Sexual Activity   Alcohol use: Yes    Comment: occasionally   Drug use: No   Sexual activity: Yes  Other Topics Concern   Not on file  Social History Narrative   Not on file   Social Drivers of Health   Financial Resource Strain: Low Risk  (04/12/2021)   Overall Financial Resource Strain (CARDIA)    Difficulty of Paying Living Expenses: Not hard at all  Food Insecurity: No Food Insecurity (04/12/2021)   Hunger Vital Sign    Worried About Running Out of Food in the Last Year: Never true    Ran Out of Food in the Last Year: Never true  Transportation Needs: No Transportation Needs (04/12/2021)   PRAPARE - Administrator, Civil Service (Medical): No    Lack of Transportation (Non-Medical): No  Physical Activity: Not on file  Stress: No Stress Concern Present (04/12/2021)   [employer] of Occupational Health - Occupational Stress Questionnaire    Feeling of Stress : Not at all  Social Connections: Unknown (05/20/2021)   [facility]   Social Network    Social Network: Not on file  Intimate Partner Violence: Not At Risk (04/12/2021)   Humiliation, Afraid, Rape, and Kick questionnaire    Fear of Current or Ex-Partner: No    Emotionally Abused: No    Physically Abused: No    Sexually Abused: No    Outpatient Medications  Prior to Visit  Medication Sig Dispense Refill   buPROPion  (WELLBUTRIN  XL) 300 MG 24 hr tablet Take 1 tablet (300 mg total) by mouth daily. 90 tablet 1   levocetirizine (XYZAL ) 5 MG tablet TAKE 1 TABLET EVERY DAY IN THE EVENING 90 tablet 3   lisdexamfetamine (VYVANSE ) 70 MG capsule Take 1 capsule (70 mg total) by mouth daily. 90 capsule 0   lisdexamfetamine (VYVANSE ) 70 MG capsule Take 1 capsule (70 mg total) by mouth daily. 90 capsule 0   pantoprazole  (PROTONIX ) 40 MG tablet Take 1 tablet (40 mg total) by mouth daily.  90 tablet 0   diazepam  (VALIUM ) 5 MG tablet 1 po qhs prn 90 tablet 0   No facility-administered medications prior to visit.    Allergies  Allergen Reactions   Nitroglycerin     Migraine   Oxycodone  Itching    Review of Systems  Constitutional:  Negative for fever and malaise/fatigue.  HENT:  Negative for congestion.   Eyes:  Negative for blurred vision.  Respiratory:  Negative for cough and shortness of breath.   Cardiovascular:  Negative for chest pain, palpitations and leg swelling.  Gastrointestinal:  Negative for vomiting.  Musculoskeletal:  Negative for back pain.  Skin:  Negative for rash.  Neurological:  Negative for loss of consciousness and headaches.  Psychiatric/Behavioral:  The patient is nervous/anxious and has insomnia.        Objective:    Physical Exam Vitals and nursing note reviewed.  Psychiatric:        Attention and Perception: She is attentive.        Mood and Affect: Mood and affect normal.        Speech: Speech normal.        Behavior: Behavior normal. Behavior is cooperative.        Thought Content: Thought content does not include homicidal or suicidal ideation. Thought content does not include homicidal or suicidal plan.        Cognition and Memory: Cognition and memory normal.        Judgment: Judgment normal.     There were no vitals taken for this visit. Wt Readings from Last 3 Encounters:  07/31/22 195 lb 6.4 oz (88.6 kg)   06/15/22 191 lb 2.2 oz (86.7 kg)  01/27/22 188 lb 6.4 oz (85.5 kg)       Assessment & Plan:  Insomnia, unspecified type -     traZODone  HCl; 2 po at bedtime prn  Dispense: 60 tablet; Refill: 3 -     diazePAM ; 1 po every day as needed panic attack  Attention deficit disorder (ADD) without hyperactivity  Attention deficit disorder (ADD) in adult Assessment & Plan: Stable No side effects from medication Con't vyvanse  70 mg daily  F/u 6 months or sooner prn    Assessment and Plan    Insomnia   She has persistent difficulty sleeping and was previously on 50 mg of trazodone  and 5 mg of diazepam , which caused morning drowsiness. After increasing trazodone  to 100 mg and discontinuing diazepam , her sleep quality improved. Despite potential side effects like dizziness and headache from higher doses of trazodone , she prefers to continue with 100 mg of trazodone  and discontinue diazepam . We will prescribe trazodone  100 mg for sleep.  Panic Attacks   She has experienced panic attacks for the last five years, with about five episodes that include sensations of swallowing glass, leading to hospital visits due to concerns of a heart attack. Diazepam  has been effective for her. After discussing the risks of diazepam , including dependency and drowsiness, she prefers to use diazepam  as needed. We will prescribe diazepam  as needed for panic attacks.  Joint Pain   She manages intermittent joint pain with occasional ibuprofen. We discussed the risks of long-term NSAID use, including gastrointestinal issues and [name] function. She prefers to continue using ibuprofen as needed. We will continue ibuprofen as needed for joint pain.  Follow-up   We will schedule a follow-up appointment in six months.        I discussed the assessment and treatment plan with the patient. The  patient was provided an opportunity to ask questions and all were answered. The patient agreed with the plan and demonstrated an  understanding of the instructions.   The patient was advised to call back or seek an in-person evaluation if the symptoms worsen or if the condition fails to improve as anticipated.  Jamee JONELLE Antonio Cyndee, DO [facility] [phone] (phone) [phone] (fax)  [facility]

## 2023-01-15 NOTE — Patient Instructions (Signed)
 Living With Attention Deficit Hyperactivity Disorder If you have been diagnosed with attention deficit hyperactivity disorder (ADHD), you may be relieved that you now know why you have felt or behaved a certain way. Still, you may feel overwhelmed about the treatment ahead. You may also wonder how to get the support you need and how to deal with the condition day-to-day. With treatment and support, you can live with ADHD and manage your symptoms. How to manage lifestyle changes Managing lifestyle changes can be challenging. Seeking support from your healthcare provider, therapist, family, and friends can be helpful. How to recognize changes in your condition The following signs may mean that your treatment is working well and your condition is improving: Consistently being on time for appointments. Being more organized at home and work. Other people noticing improvements in your behavior. Achieving goals that you set for yourself. Thinking more clearly. The following signs may mean that your treatment is not working very well: Feeling impatience or more confusion. Missing, forgetting, or being late for appointments. An increasing sense of disorganization and messiness. More difficulty in reaching goals that you set for yourself. Loved ones becoming angry or frustrated with you. Follow these instructions at home: Medicines Take over-the-counter and prescription medicines only as told by your health care provider. Check with your health care provider before taking any new medicines. General instructions Create structure and an organized atmosphere at home. For example: Make a list of tasks, then rank them from most important to least important. Work on one task at a time until your listed tasks are done. Make a daily schedule and follow it consistently every day. Use an appointment calendar, and check it 2-3 times a day to keep on track. Keep it with you when you leave the house. Create  spaces where you keep certain things, and always put things back in their places after you use them. Keep all follow-up visits. Your health care provider will need to monitor your condition and adjust your treatment over time. Where to find support Talking to others  Keep emotion out of important discussions and speak in a calm, logical way. Listen closely and patiently to your loved ones. Try to understand their point of view, and try to avoid getting defensive. Take responsibility for the consequences of your actions. Ask that others do not take your behaviors personally. Aim to solve problems as they come up, and express your feelings instead of bottling them up. Talk openly about what you need from your loved ones and how they can support you. Consider going to family therapy sessions or having your family meet with a specialist who deals with ADHD-related behavior problems. Finances Not all insurance plans cover mental health care, so it is important to check with your insurance carrier. If paying for co-pays or counseling services is a problem, search for a local or county mental health care center. Public mental health care services may be offered there at a low cost or no cost when you are not able to see a private health care provider. If you are taking medicine for ADHD, you may be able to get the generic form, which may be less expensive than brand-name medicine. Some makers of prescription medicines also offer help to patients who cannot afford the medicines that they need. Therapy and support groups Talking with a mental health care provider and participating in support groups can help to improve your quality of life, daily functioning, and overall symptoms. Questions to ask your health  care provider: What are the risks and benefits of taking medicines? Would I benefit from therapy? How often should I follow up with a health care provider? Where to find more information Learn more  about ADHD from: Children and Adults with Attention Deficit Hyperactivity Disorder: [employer] of Mental Health: BloggerCourse.com Centers for Disease Control and Prevention: TonerPromos.no Contact a health care provider if: You have side effects from your medicines, such as: Repeated muscle twitches, coughing, or speech outbursts. Sleep problems. Loss of appetite. Dizziness. Unusually fast heartbeat. Stomach pains. Headaches. You have new or worsening behavior problems. You are struggling with anxiety, depression, or substance abuse. Get help right away if: You have a severe reaction to a medicine. These symptoms may be an emergency. Get help right away. Call 911. Do not wait to see if the symptoms will go away. Do not drive yourself to the hospital. Take one of these steps if you feel like you may hurt yourself or others, or have thoughts about taking your own life: Go to your nearest emergency room. Call 911. Call the National Suicide Prevention Lifeline at 662 550 4067 or 988. This is open 24 hours a day. Text the Crisis Text Line at 430-382-2444. Summary With treatment and support, you can live with ADHD and manage your symptoms. Consider taking part in family therapy or self-help groups with family members or friends. When you talk with friends and family about your ADHD, be patient and communicate openly. Keep all follow-up visits. Your health care provider will need to monitor your condition and adjust your treatment over time. This information is not intended to replace advice given to you by your health care provider. Make sure you discuss any questions you have with your health care provider. Document Revised: 04/15/2021 Document Reviewed: 04/15/2021 Elsevier Patient Education  2024 [employer].

## 2023-02-12 ENCOUNTER — Other Ambulatory Visit: Payer: Self-pay | Admitting: Family Medicine

## 2023-02-12 DIAGNOSIS — K2970 Gastritis, unspecified, without bleeding: Secondary | ICD-10-CM

## 2023-02-12 DIAGNOSIS — F418 Other specified anxiety disorders: Secondary | ICD-10-CM

## 2023-02-15 ENCOUNTER — Other Ambulatory Visit: Payer: Self-pay | Admitting: Family Medicine

## 2023-02-15 DIAGNOSIS — G4700 Insomnia, unspecified: Secondary | ICD-10-CM

## 2023-02-22 ENCOUNTER — Encounter: Payer: Self-pay | Admitting: Family Medicine

## 2023-02-23 ENCOUNTER — Other Ambulatory Visit: Payer: Self-pay | Admitting: Family Medicine

## 2023-02-23 DIAGNOSIS — K5901 Slow transit constipation: Secondary | ICD-10-CM

## 2023-02-23 MED ORDER — LACTULOSE 10 GM/15ML PO SOLN
10.0000 g | Freq: Every day | ORAL | 0 refills | Status: DC | PRN
Start: 2023-02-23 — End: 2023-03-05

## 2023-02-26 ENCOUNTER — Other Ambulatory Visit: Payer: Self-pay | Admitting: Family Medicine

## 2023-02-26 DIAGNOSIS — G4700 Insomnia, unspecified: Secondary | ICD-10-CM

## 2023-02-26 NOTE — Telephone Encounter (Signed)
Copied from CRM [id number]. Topic: Clinical - Medication Refill >> Feb 26, 2023  9:38 AM Shelbie Proctor wrote: Most Recent Primary Care Visit:  Provider: Seabron Spates R  Department: [facility]  Visit Type: MYCHART VIDEO VISIT  Date: 01/15/2023  Medication: TraZODone (DESYREL) 50 MG tablet   Has the patient contacted their pharmacy? Yes. Luster Landsberg fro [facility] [phone] requesting refill for patient, faxed over on the 02/19/23, patient is out of meds. (Agent: If no, request that the patient contact the pharmacy for the refill. If patient does not wish to contact the pharmacy document the reason why and proceed with request.) (Agent: If yes, when and what did the pharmacy advise?)  Is this the correct pharmacy for this prescription? Yes If no, delete pharmacy and type the correct one.  This is the patient's preferred pharmacy:  [facility] - [place], [place] - [address] [place] [address] Phone:[phone]:[phone]   Has the prescription been filled recently? No  Is the patient out of the medication? Yes  Has the patient been seen for an appointment in the last year OR does the patient have an upcoming appointment? Yes  Can we respond through MyChart?   Agent: Please be advised that Rx refills may take up to 3 business days. We ask that you follow-up with your pharmacy.

## 2023-02-26 NOTE — Telephone Encounter (Signed)
Last Fill: 02/15/23 180 tab/2 refill  Last OV: 01/15/23 Next OV: None Scheduled  Routing to provider for review/authorization.

## 2023-03-05 ENCOUNTER — Other Ambulatory Visit: Payer: Self-pay | Admitting: Family Medicine

## 2023-03-05 DIAGNOSIS — K5901 Slow transit constipation: Secondary | ICD-10-CM

## 2023-03-10 ENCOUNTER — Encounter: Payer: Self-pay | Admitting: Family Medicine

## 2023-03-12 ENCOUNTER — Other Ambulatory Visit: Payer: Self-pay | Admitting: Family Medicine

## 2023-03-12 DIAGNOSIS — G4700 Insomnia, unspecified: Secondary | ICD-10-CM

## 2023-03-12 NOTE — Telephone Encounter (Signed)
 Requesting: diazepam 5mg   Contract:03/26/20 UDS: 03/26/20 Last Visit: 01/15/23 Next Visit: None Last Refill: last refill date unknown  Please Advise

## 2023-03-14 ENCOUNTER — Other Ambulatory Visit (HOSPITAL_COMMUNITY): Payer: Self-pay

## 2023-03-14 ENCOUNTER — Telehealth: Payer: Self-pay

## 2023-03-14 NOTE — Telephone Encounter (Signed)
 Pharmacy Patient Advocate Encounter   Received notification from Patient Advice Request messages that prior authorization for Vyvanse 70MG  capsules is required/requested.   Insurance verification completed.   The patient is insured through [employer] .   Per test claim: PA required; PA submitted to above mentioned insurance via CoverMyMeds Key/confirmation #/EOC [facility] Status is pending

## 2023-03-15 ENCOUNTER — Other Ambulatory Visit (HOSPITAL_COMMUNITY): Payer: Self-pay

## 2023-03-15 NOTE — Telephone Encounter (Signed)
Approval sent to patient via Mychart.

## 2023-03-15 NOTE — Telephone Encounter (Signed)
 Pharmacy Patient Advocate Encounter  Received notification from [facility] that Prior Authorization for Vyvanse 70MG  capsules  has been APPROVED from 01/10/23 to 01/09/24. Ran test claim, Copay is $210.10. This test claim was processed through [facility]- copay amounts may vary at other pharmacies due to pharmacy/plan contracts, or as the patient moves through the different stages of their insurance plan.   PA #/Case ID/Reference #: [id number]

## 2023-03-18 DIAGNOSIS — W19XXXA Unspecified fall, initial encounter: Secondary | ICD-10-CM | POA: Diagnosis not present

## 2023-03-18 DIAGNOSIS — S2232XA Fracture of one rib, left side, initial encounter for closed fracture: Secondary | ICD-10-CM | POA: Diagnosis not present

## 2023-03-20 DIAGNOSIS — Z9882 Breast implant status: Secondary | ICD-10-CM | POA: Diagnosis not present

## 2023-03-20 DIAGNOSIS — R928 Other abnormal and inconclusive findings on diagnostic imaging of breast: Secondary | ICD-10-CM | POA: Diagnosis not present

## 2023-03-21 ENCOUNTER — Other Ambulatory Visit: Payer: Self-pay | Admitting: Family Medicine

## 2023-03-21 DIAGNOSIS — F988 Other specified behavioral and emotional disorders with onset usually occurring in childhood and adolescence: Secondary | ICD-10-CM

## 2023-03-21 MED ORDER — LISDEXAMFETAMINE DIMESYLATE 70 MG PO CAPS: 70.0000 mg | ORAL_CAPSULE | Freq: Every day | ORAL | 0 refills | Status: DC

## 2023-03-21 NOTE — Telephone Encounter (Signed)
 Requesting: Vyvanse 70 mg  Contract: N/A UDS: N/A Last Visit: 01/15/2023 Next Visit: N/A Last Refill: 12/11/2022  Please Advise

## 2023-03-21 NOTE — Telephone Encounter (Signed)
 Copied from CRM [id number]. Topic: Clinical - Medication Refill >> Mar 21, 2023  3:19 PM Ernst Spell wrote: Most Recent Primary Care Visit:  Provider: Seabron Spates R  Department: [facility]  Visit Type: Earleen Reaper VIDEO VISIT  Date: 01/15/2023  Medication: vyvanse  Has the patient contacted their pharmacy? Yes (Agent: If no, request that the patient contact the pharmacy for the refill. If patient does not wish to contact the pharmacy document the reason why and proceed with request.) (Agent: If yes, when and what did the pharmacy advise?)  Is this the correct pharmacy for this prescription? Yes If no, delete pharmacy and type the correct one.  This is the patient's preferred pharmacy:   [facility] - [place], [place] - [address] [place] [address] Phone: [phone] Fax: [phone]   Has the prescription been filled recently? Yes  Is the patient out of the medication? No  Has the patient been seen for an appointment in the last year OR does the patient have an upcoming appointment? Yes  Can we respond through MyChart? No  Agent: Please be advised that Rx refills may take up to 3 business days. We ask that you follow-up with your pharmacy.

## 2023-03-22 ENCOUNTER — Telehealth: Payer: Self-pay | Admitting: Family Medicine

## 2023-03-22 ENCOUNTER — Other Ambulatory Visit: Payer: Self-pay | Admitting: Family Medicine

## 2023-03-22 DIAGNOSIS — F988 Other specified behavioral and emotional disorders with onset usually occurring in childhood and adolescence: Secondary | ICD-10-CM

## 2023-03-22 MED ORDER — LISDEXAMFETAMINE DIMESYLATE 70 MG PO CAPS: 70.0000 mg | ORAL_CAPSULE | Freq: Every day | ORAL | 0 refills | Status: DC

## 2023-03-22 NOTE — Addendum Note (Signed)
 Addended byConrad Parkline D on: 03/22/2023 01:53 PM   Modules accepted: Orders

## 2023-03-22 NOTE — Telephone Encounter (Signed)
 Copied from CRM [id number]. Topic: General - Other >> Mar 22, 2023 12:56 PM Melissa C wrote: Reason for CRM: patient wanted to speak with the office manager as her prior authorization for her medication took a long time to get taken care of and now her prescription is not getting taken care of with the prior authorization. She is out of the medication and she is upset that she had to wait this long. I called over to the CAL and they gave me some information which I relayed to patient. Please advise with patient. Thank you.

## 2023-03-26 ENCOUNTER — Telehealth: Payer: Self-pay | Admitting: *Deleted

## 2023-03-26 ENCOUNTER — Telehealth: Payer: Self-pay | Admitting: Emergency Medicine

## 2023-03-26 ENCOUNTER — Other Ambulatory Visit: Payer: Self-pay | Admitting: Family Medicine

## 2023-03-26 ENCOUNTER — Ambulatory Visit: Payer: Self-pay | Admitting: Family Medicine

## 2023-03-26 DIAGNOSIS — F988 Other specified behavioral and emotional disorders with onset usually occurring in childhood and adolescence: Secondary | ICD-10-CM

## 2023-03-26 MED ORDER — AMPHETAMINE-DEXTROAMPHET ER 20 MG PO CP24
20.0000 mg | ORAL_CAPSULE | ORAL | 0 refills | Status: DC
Start: 2023-03-26 — End: 2023-04-30

## 2023-03-26 NOTE — Telephone Encounter (Unsigned)
 Copied from CRM [id number]. Topic: Clinical - Prescription Issue >> Mar 26, 2023 11:07 AM Martha Clan wrote: Reason for CRM: lisdexamfetamine (VYVANSE) 70 MG capsule [[id number]] Patient has been unable to receive the medication in the month of march due to price and pharmacies not carrying. Patient is requesting an alternative or something more readily available. Patient is requesting a call as soon as possible.

## 2023-03-26 NOTE — Telephone Encounter (Signed)
 See nurse triage call from today. Message already sent to pcp

## 2023-03-26 NOTE — Telephone Encounter (Signed)
 Copied from CRM [id number]. Topic: Clinical - Red Word Triage >> Mar 26, 2023 11:40 AM Eunice Blase wrote: Red Word that prompted transfer to Nurse Triage: Pt's husband called on half of wife, husband states since wife has been off Vivance she has been like a zombie.   Chief Complaint: Medication Alternative Request Symptoms: n/a Frequency: n/a Pertinent Negatives: Patient denies chest pain, difficulty breathing, nausea, vomiting, diarrhea Disposition: [] ED /[] Urgent Care (no appt availability in office) / [] Appointment(In office/virtual)/ []  [place] [facility] [facility]/ [] Home Care/ [] Refused Recommended Disposition /[] [place] [facility]/ []  Follow-up with PCP Additional Notes: Patient's husband has called on behalf of the patient.  He states that she has not taken her Vyvanse in two weeks due to pharmacies not having it on hand.  He states that the patient states that she is "in a fog" and "like a zombie". The local pharmacy told the patient that this medication was $1,100 so they never have it on hand. Patient got on the phone with this RN and she states that she goes through issues with this pharmacy every month. Patient wants to change her prescription from Vyvanse to Adderall.  She states that she spoke with a pharmacist at [facility] and asked if there was an alternative for Vyvanse and they recommended Adderall to the patient. She would like an Adderall prescription sent to: [facility] [address] [place], [place] [address]  Patient states that she never picked up the Vyvanse because it was not available anywhere.  Patient did not want triage at this time.  She is alert & oriented, no breathing problems, and no new major concerns. She was alert to person, place, and time as well.  She denies any chest pain, difficulty breathing, nausea, vomiting, diarrhea when asked by this RN. She states that she just wants the alternative medication request sent to her PCP.  She  also wanted to know if her PCP Dr Zola Button wanted to prescribe the immediate release form of Adderall or the extended release form of Adderall.   Patient states that she has been dealing with trying to get her medication since March 1st.  Patient is advised by this RN that if her PCP needs to see her or speak with her prior to this possible change, she will be contacted. Patient then added that she would like this to hopefully be accomplished quickly due to her being off of medication for over two weeks.  She also added that a friend of hers had a stroke when she wasn't on her medication like this.  This RN advised the patient that if she felt like this was a concern to go to the Emergency Room to be further evaluated.  She denied that this was an issue but just wanted some advice or a response from her PCP about this as soon as possible. Patient is still advised again by this RN that if anything changes or worsens to go to the Emergency Room for immediate medical attention.  Patient verbalized understanding.         Reason for Disposition  Caller wants to use a complementary or alternative medicine  Protocols used: Medication Question Call-A-AH

## 2023-03-26 NOTE — Telephone Encounter (Signed)
 Copied from CRM [id number]. Topic: Clinical - Prescription Issue >> Mar 26, 2023 11:07 AM Martha Clan wrote: Reason for CRM: lisdexamfetamine (VYVANSE) 70 MG capsule [[id number]] Patient has been unable to receive the medication in the month of march due to price and pharmacies not carrying. Patient is requesting an alternative or something more readily available. Patient is requesting a call as soon as possible.

## 2023-03-27 NOTE — Telephone Encounter (Signed)
 Responded to pt's my chart.

## 2023-04-02 ENCOUNTER — Telehealth: Payer: Self-pay | Admitting: Family Medicine

## 2023-04-02 NOTE — Telephone Encounter (Signed)
 Copied from CRM [id number]. Topic: General - Other >> Apr 02, 2023 11:22 AM Alphonzo Lemmings O wrote: Reason for CRM: dilan from [facility] is calling cause  member gave [place] a call because a claim was denied and the reason is provider didnt send the referral. Just need the referral to review it and approve it . dilan will give fax number that the provider can send it to for them to review and approve it  Fax number [phone]

## 2023-04-09 DIAGNOSIS — Z853 Personal history of malignant neoplasm of breast: Secondary | ICD-10-CM | POA: Diagnosis not present

## 2023-04-09 DIAGNOSIS — T8549XA Other mechanical complication of breast prosthesis and implant, initial encounter: Secondary | ICD-10-CM | POA: Diagnosis not present

## 2023-04-09 DIAGNOSIS — N651 Disproportion of reconstructed breast: Secondary | ICD-10-CM | POA: Diagnosis not present

## 2023-04-11 DIAGNOSIS — K5900 Constipation, unspecified: Secondary | ICD-10-CM | POA: Diagnosis not present

## 2023-04-11 DIAGNOSIS — R1030 Lower abdominal pain, unspecified: Secondary | ICD-10-CM | POA: Diagnosis not present

## 2023-04-13 ENCOUNTER — Encounter: Payer: Self-pay | Admitting: Medical

## 2023-04-13 ENCOUNTER — Ambulatory Visit (HOSPITAL_BASED_OUTPATIENT_CLINIC_OR_DEPARTMENT_OTHER)
Admission: RE | Admit: 2023-04-13 | Discharge: 2023-04-13 | Disposition: A | Discharge status: Home / Self Care | Source: Ambulatory Visit | Attending: Medical | Admitting: Medical

## 2023-04-13 ENCOUNTER — Telehealth (INDEPENDENT_AMBULATORY_CARE_PROVIDER_SITE_OTHER): Admitting: Medical

## 2023-04-13 ENCOUNTER — Other Ambulatory Visit

## 2023-04-13 VITALS — BP 126/79 | HR 75

## 2023-04-13 DIAGNOSIS — K5900 Constipation, unspecified: Secondary | ICD-10-CM

## 2023-04-13 DIAGNOSIS — R109 Unspecified abdominal pain: Secondary | ICD-10-CM | POA: Diagnosis not present

## 2023-04-13 DIAGNOSIS — R1032 Left lower quadrant pain: Secondary | ICD-10-CM | POA: Diagnosis not present

## 2023-04-13 LAB — CBC WITH DIFFERENTIAL/PLATELET
Absolute Lymphocytes: 1855 {cells}/uL (ref 850–3900)
Absolute Monocytes: 405 {cells}/uL (ref 200–950)
Basophils Absolute: 30 {cells}/uL (ref 0–200)
Basophils Relative: 0.6 %
Eosinophils Absolute: 160 {cells}/uL (ref 15–500)
Eosinophils Relative: 3.2 %
HCT: 37.2 % (ref 35.0–45.0)
Hemoglobin: 11.7 g/dL (ref 11.7–15.5)
MCH: 26.3 pg — ABNORMAL LOW (ref 27.0–33.0)
MCHC: 31.5 g/dL — ABNORMAL LOW (ref 32.0–36.0)
MCV: 83.6 fL (ref 80.0–100.0)
MPV: 10.4 fL (ref 7.5–12.5)
Monocytes Relative: 8.1 %
Neutro Abs: 2550 {cells}/uL (ref 1500–7800)
Neutrophils Relative %: 51 %
Platelets: 343 10*3/uL (ref 140–400)
RBC: 4.45 10*6/uL (ref 3.80–5.10)
RDW: 14.4 % (ref 11.0–15.0)
Total Lymphocyte: 37.1 %
WBC: 5 10*3/uL (ref 3.8–10.8)

## 2023-04-13 LAB — COMPREHENSIVE METABOLIC PANEL WITH GFR
AG Ratio: 1.5 (calc) (ref 1.0–2.5)
ALT: 14 U/L (ref 6–29)
AST: 13 U/L (ref 10–35)
Albumin: 4 g/dL (ref 3.6–5.1)
Alkaline phosphatase (APISO): 58 U/L (ref 37–153)
BUN: 17 mg/dL (ref 7–25)
CO2: 28 mmol/L (ref 20–32)
Calcium: 9.4 mg/dL (ref 8.6–10.4)
Chloride: 107 mmol/L (ref 98–110)
Creat: 0.8 mg/dL (ref 0.50–1.05)
Globulin: 2.6 g/dL (ref 1.9–3.7)
Glucose, Bld: 96 mg/dL (ref 65–99)
Potassium: 4.4 mmol/L (ref 3.5–5.3)
Sodium: 144 mmol/L (ref 135–146)
Total Bilirubin: 0.3 mg/dL (ref 0.2–1.2)
Total Protein: 6.6 g/dL (ref 6.1–8.1)
eGFR: 80 mL/min/{1.73_m2} (ref >=60)

## 2023-04-13 MED ORDER — AMOXICILLIN-POT CLAVULANATE 875-125 MG PO TABS: 1.0000 | ORAL_TABLET | Freq: Two times a day (BID) | ORAL | 0 refills | Status: DC

## 2023-04-13 NOTE — Addendum Note (Signed)
 Addended by: Marian Sorrow D on: 04/13/2023 02:43 PM   Modules accepted: Orders

## 2023-04-13 NOTE — Progress Notes (Signed)
 Virtual Visit via Video Note  I connected with Theresa [name] on 04/13/23 at  1:40 PM EDT by a video enabled telemedicine application and verified that I am speaking with the correct person using two identifiers.  Location: Patient: home [place] Provider: office [place]   I discussed the limitations of evaluation and management by telemedicine and the availability of in person appointments. The patient expressed understanding and agreed to proceed.  History of Present Illness: Discussed the use of AI scribe software for clinical note transcription with the patient, who gave verbal consent to proceed.  Prior to office visit saw pt was scheduled with me by video. I read complaint and thought not best way to evaluate patient with abdomen pain etc. Discussed with pt pcp who agreed. Asked RN in office to call pt to try to get he scheduled for in office rather than video visit. As labs and imaging studies may be needed as we approach weekend.    See scheduling note. Pt states she can't be seen in office. Note states she can't drive. Thankfully during interview/video visit pt stating she could come to office if needed to.   History of Present Illness         [facility]  Abdominal Pain (Woke on the 27th not feeling well. Having abd pain. Took a suppository and Murelax on the 27th. Ended up to the bathroom that night and felt better. Has a HX of constipation. Pains stated up again today. )   "Theresa [name] is a 68 y.o. year old female who presents complaining of abdominal pain since 04/05/23. Notes every so often, she gets this same pain in the same place. States she also has a history of constipation. Reports she did miralax and a suppository. States she has felt sore ever since. Notes the pain comes back every 45 minutes or so and it bends her over. States she had vomiting on 04/05/23, but none since. Reports decreased appetite."  TECHNIQUE: Supine and erect radiograph(s) of the  abdomen were obtained.   FINDINGS:   . Gastrointestinal tract: Nonobstructive bowel gas pattern. No evidence of  pneumoperitoneum. Moderate burden of solid stool in the colon.  . Calcifications: Pelvic phleboliths.  . Musculoskeletal: No acute bony abnormality.  . Lower chest: No acute process.  . Lines/Tubes/Surgical: Right upper quadrant clips.   IMPRESSION:   Nonobstructive bowel gas pattern. Moderate colonic stool burden.   2. Constipation, unspecified constipation type  Patient presenting today with lower abdominal pain and constipation. Reports long history of constipation issues. Has tried MiraLAX at home with minimal relief. On abdominal exam, patient does have tenderness in the left lower quadrant. No urinary symptoms. X-ray shows nonobstructive bowel gas pattern and moderate colonic stool burden. Recommended dose of magnesium citrate for more immediate relief. Discussed daily MiraLAX with increased water intake. Advised to discuss possible Linzess or Trulance with her PCP due to chronic constipation issues. Red flag symptoms and return precautions discussed.  RTO or go to the emergency department if symptoms worsen Patient indicated they understood and verbalized agreement All questions were answered prior to leaving    Patient has been instructed on medications, dosages, side effects, and possible interactions as associated with each diagnosis in my impression and plan above.  Patient education (verbal/handout) given on diagnosis, pathophysiology, treatment of diagnosis, side effects of medication use for treatment, restrictions while taking medication, supportive measures such as pushing fluids, using OTC medications  Patient was instructed on  when to follow up and know that they can follow up here, with their PCP, Urgent Care, ED. They have been instructed that if symptoms worsen that they should return to the clinic, go to the nearest ED, or activate EMS."   Theresa A  [name] is a 68 year old female who presents with severe constipation and abdominal pain. She is accompanied by her husband, Theresa [name].  She has a long-standing history of constipation, which has recently worsened. She experiences left-sided abdominal pain that intensifies with constipation. This pain has been present intermittently for about a year but has become more persistent and severe over the past week.  A week ago, she experienced severe constipation leading to vomiting and extreme fatigue, describing the sensation as feeling 'poisoned' and 'awful'. She managed to have a bowel movement after taking MiraLAX and a laxative, which provided some relief, but the left-sided pain persisted. Two days ago, the pain worsened, prompting a visit to urgent care where x-rays indicated a significant stool burden. Following this, she took two magnesium citrates, a suppository, and a fleet enema, resulting in frequent bowel movements every ten minutes for twelve hours. Despite this, she continues to experience  some milder pain in the left lower abdomen, which she fears will worsen if she eats.  She reports losing ten pounds recently and fears eating due to the potential for recurrent severe pain. No current nausea, vomiting, fever, chills, or sweats. She denies any history of bowel obstruction or abdominal surgeries.  She mentions having only one car, which poses a challenge for attending in-person appointments.      Observations/Objective: General-no acute distress, pleasant, oriented. Lungs- on inspection lungs appear unlabored. Neck- no tracheal deviation or jvd on inspection. Neuro- gross motor function appears intact.  Assessment and Plan: Assessment and Plan [phone] [place]) pt    Constipation with abdominal pain Chronic constipation with recent exacerbation causing significant left-sided abdominal pain. Differential includes bowel obstruction or diverticulitis. Previous x-rays showed  significant stool burden. But after various otc treatment describes significant large volume watery stools and 10 lb weight loss. yet still having some llq abd pain - Order CBC and metabolic panel to assess infection markers and metabolic status. - Order two-view abdominal x-ray to assess current stool burden and anatomical details. - Advise follow-up with results later today or early evening. By my chart or by phone(if needed) - Consider antibiotics for diverticulitis if infection markers are elevated and no stool burden is found. - Provided instructions for lab and x-ray procedures.  Follow up date and further tx to be determined after lab and imaging review.   Esperanza Richters, PA-C  Follow Up Instructions:    I discussed the assessment and treatment plan with the patient. The patient was provided an opportunity to ask questions and all were answered. The patient agreed with the plan and demonstrated an understanding of the instructions.   The patient was advised to call back or seek an in-person evaluation if the symptoms worsen or if the condition fails to improve as anticipated.   Esperanza Richters, PA-C

## 2023-04-13 NOTE — Addendum Note (Signed)
 Addended by: Gwenevere Abbot on: 04/13/2023 09:30 PM   Modules accepted: Orders

## 2023-04-13 NOTE — Patient Instructions (Signed)
 Constipation with abdominal pain Chronic constipation with recent exacerbation causing significant left-sided abdominal pain. Differential includes bowel obstruction or diverticulitis. Previous x-rays showed significant stool burden. But after various otc treatment describes significant large volume watery stools and 10 lb weight loss. yet still having some llq abd pain - Order CBC and metabolic panel to assess infection markers and metabolic status. - Order two-view abdominal x-ray to assess current stool burden and anatomical details. - Advise follow-up with results later today or early evening. By my chart or by phone(if needed) - Consider antibiotics for diverticulitis if infection markers are elevated and no stool burden is found. - Provided instructions for lab and x-ray procedures.  Follow up date and further tx to be determined after lab and imaging review.

## 2023-04-26 ENCOUNTER — Encounter: Payer: Self-pay | Admitting: Family Medicine

## 2023-04-26 DIAGNOSIS — F988 Other specified behavioral and emotional disorders with onset usually occurring in childhood and adolescence: Secondary | ICD-10-CM

## 2023-04-26 DIAGNOSIS — G4700 Insomnia, unspecified: Secondary | ICD-10-CM

## 2023-04-30 MED ORDER — DIAZEPAM 5 MG PO TABS: 5.0000 mg | ORAL_TABLET | Freq: Every evening | ORAL | 1 refills | Status: DC | PRN

## 2023-04-30 MED ORDER — AMPHETAMINE-DEXTROAMPHET ER 20 MG PO CP24: 20.0000 mg | ORAL_CAPSULE | ORAL | 0 refills | Status: DC

## 2023-04-30 NOTE — Telephone Encounter (Signed)
*  Pt requesting 90 day supply of Adderall XR*    Requesting: Adderall XR Contract: 2022 UDS: 2022 Last OV: 01/15/2023, v/v Next OV: n/a Last Refill: 03/26/2023, #30-0RF Database:   Requesting: Valium  Contract: 2022 [id number]  Last OV: Next OV: Last Refill: 03/14/2023, #30--1 RF Database:   Please advise

## 2023-04-30 NOTE — Telephone Encounter (Signed)
 Patient called back in request to speak to office manager because medication has still not been sent to pharmacy. She is out of medication and requested on 17th and needs before 11 for trip. [facility] Reuest to speak with office manager. Called [facility]. Call disconnected while on hold for someone. Patient does not want to drive up there to the office because she might miss her flight. Thank You

## 2023-04-30 NOTE — Telephone Encounter (Signed)
 Called pt back and expressed we are doing our best to fill rx. It was explained to pt refill requests can take up to 3 business days and we were closed for the weekend by the time she requested rx. Advised pt pcp is in clinic right now and we cannot pull her out of a room to fill rx but we will do our best to try and fill this before she has to leave. She stated that is very upsetting and not fair.  Heather made aware.    [id number] [place], [place] - [address], [place] [address] Phone: [phone]  Fax: [phone]

## 2023-05-23 ENCOUNTER — Emergency Department (HOSPITAL_COMMUNITY)

## 2023-05-23 ENCOUNTER — Emergency Department (HOSPITAL_COMMUNITY)
Admission: EM | Admit: 2023-05-23 | Discharge: 2023-05-23 | Disposition: A | Discharge status: Home / Self Care | Attending: Emergency Medicine | Admitting: Emergency Medicine

## 2023-05-23 ENCOUNTER — Encounter (HOSPITAL_COMMUNITY): Payer: Self-pay | Admitting: *Deleted

## 2023-05-23 ENCOUNTER — Other Ambulatory Visit: Payer: Self-pay

## 2023-05-23 DIAGNOSIS — R109 Unspecified abdominal pain: Secondary | ICD-10-CM | POA: Diagnosis not present

## 2023-05-23 DIAGNOSIS — R1013 Epigastric pain: Secondary | ICD-10-CM | POA: Diagnosis present

## 2023-05-23 DIAGNOSIS — Z9049 Acquired absence of other specified parts of digestive tract: Secondary | ICD-10-CM | POA: Diagnosis not present

## 2023-05-23 DIAGNOSIS — K5792 Diverticulitis of intestine, part unspecified, without perforation or abscess without bleeding: Secondary | ICD-10-CM | POA: Diagnosis not present

## 2023-05-23 DIAGNOSIS — K5732 Diverticulitis of large intestine without perforation or abscess without bleeding: Secondary | ICD-10-CM | POA: Diagnosis not present

## 2023-05-23 DIAGNOSIS — N281 Cyst of kidney, acquired: Secondary | ICD-10-CM | POA: Diagnosis not present

## 2023-05-23 LAB — CBC WITH DIFFERENTIAL/PLATELET
Abs Immature Granulocytes: 0.01 10*3/uL (ref 0.00–0.07)
Basophils Absolute: 0 10*3/uL (ref 0.0–0.1)
Basophils Relative: 1 %
Eosinophils Absolute: 0.1 10*3/uL (ref 0.0–0.5)
Eosinophils Relative: 2 %
HCT: 36.9 % (ref 36.0–46.0)
Hemoglobin: 11.6 g/dL — ABNORMAL LOW (ref 12.0–15.0)
Immature Granulocytes: 0 %
Lymphocytes Relative: 32 %
Lymphs Abs: 1.6 10*3/uL (ref 0.7–4.0)
MCH: 26.5 pg (ref 26.0–34.0)
MCHC: 31.4 g/dL (ref 30.0–36.0)
MCV: 84.4 fL (ref 80.0–100.0)
Monocytes Absolute: 0.4 10*3/uL (ref 0.1–1.0)
Monocytes Relative: 8 %
Neutro Abs: 2.8 10*3/uL (ref 1.7–7.7)
Neutrophils Relative %: 57 %
Platelets: 249 10*3/uL (ref 150–400)
RBC: 4.37 MIL/uL (ref 3.87–5.11)
RDW: 15.9 % — ABNORMAL HIGH (ref 11.5–15.5)
WBC: 5 10*3/uL (ref 4.0–10.5)
nRBC: 0 % (ref 0.0–0.2)

## 2023-05-23 LAB — URINALYSIS, ROUTINE W REFLEX MICROSCOPIC
Bilirubin Urine: NEGATIVE
Glucose, UA: NEGATIVE mg/dL
Ketones, ur: NEGATIVE mg/dL
Nitrite: NEGATIVE
Protein, ur: NEGATIVE mg/dL
Specific Gravity, Urine: 1.024 (ref 1.005–1.030)
pH: 5 (ref 5.0–8.0)

## 2023-05-23 LAB — COMPREHENSIVE METABOLIC PANEL WITH GFR
ALT: 19 U/L (ref 0–44)
AST: 20 U/L (ref 15–41)
Albumin: 3.5 g/dL (ref 3.5–5.0)
Alkaline Phosphatase: 58 U/L (ref 38–126)
Anion gap: 10 (ref 5–15)
BUN: 22 mg/dL (ref 8–23)
CO2: 26 mmol/L (ref 22–32)
Calcium: 9.2 mg/dL (ref 8.9–10.3)
Chloride: 103 mmol/L (ref 98–111)
Creatinine, Ser: 0.95 mg/dL (ref 0.44–1.00)
GFR, Estimated: >60 mL/min (ref >60)
Glucose, Bld: 114 mg/dL — ABNORMAL HIGH (ref 70–99)
Potassium: 3.6 mmol/L (ref 3.5–5.1)
Sodium: 139 mmol/L (ref 135–145)
Total Bilirubin: 0.4 mg/dL (ref 0.0–1.2)
Total Protein: 6.9 g/dL (ref 6.5–8.1)

## 2023-05-23 LAB — LIPASE, BLOOD: Lipase: 28 U/L (ref 11–51)

## 2023-05-23 MED ORDER — CIPROFLOXACIN HCL 500 MG PO TABS
500.0000 mg | ORAL_TABLET | Freq: Once | ORAL | Status: AC
  Administered 2023-05-23: 500 mg via ORAL
  Filled 2023-05-23: qty 1

## 2023-05-23 MED ORDER — METRONIDAZOLE 500 MG PO TABS: 500.0000 mg | ORAL_TABLET | Freq: Three times a day (TID) | ORAL | 0 refills | Status: AC

## 2023-05-23 MED ORDER — ONDANSETRON HCL 4 MG/2ML IJ SOLN
4.0000 mg | Freq: Once | INTRAMUSCULAR | Status: AC
  Administered 2023-05-23: 4 mg via INTRAVENOUS
  Filled 2023-05-23: qty 2

## 2023-05-23 MED ORDER — METRONIDAZOLE 500 MG PO TABS
500.0000 mg | ORAL_TABLET | Freq: Once | ORAL | Status: AC
  Administered 2023-05-23: 500 mg via ORAL
  Filled 2023-05-23: qty 1

## 2023-05-23 MED ORDER — CIPROFLOXACIN HCL 500 MG PO TABS: 500.0000 mg | ORAL_TABLET | Freq: Two times a day (BID) | ORAL | 0 refills | Status: AC

## 2023-05-23 MED ORDER — MORPHINE SULFATE (PF) 4 MG/ML IV SOLN
4.0000 mg | Freq: Once | INTRAVENOUS | Status: AC
  Administered 2023-05-23: 4 mg via INTRAVENOUS
  Filled 2023-05-23: qty 1

## 2023-05-23 MED ORDER — IOHEXOL 350 MG/ML SOLN
75.0000 mL | Freq: Once | INTRAVENOUS | Status: AC | PRN
  Administered 2023-05-23: 75 mL via INTRAVENOUS

## 2023-05-23 NOTE — ED Provider Triage Note (Signed)
 Emergency Medicine Provider Triage Evaluation Note  Theresa [name] , a 68 y.o. female  was evaluated in triage.  Pt complains of abd pain/constipation x few months.  Review of Systems  Positive: Bloating/fire pain all the time, from epigastric region to pelvis Negative: N/V/D, blood in stool, fever, chills, CP, SHOB  Physical Exam  There were no vitals taken for this visit. Gen:   Awake, no distress   Resp:  Normal effort  MSK:   Moves extremities without difficulty  Other:    Medical Decision Making  Medically screening exam initiated at 5:10 PM.  Appropriate orders placed.  Ellaina A Kinner was informed that the remainder of the evaluation will be completed by another provider, this initial triage assessment does not replace that evaluation, and the importance of remaining in the ED until their evaluation is complete.  Labs ordered   Carie Charity, PA-C 05/23/23 1712

## 2023-05-23 NOTE — ED Provider Notes (Signed)
 [place] EMERGENCY DEPARTMENT AT [facility] Provider Note   CSN: [id number] Arrival date & time: 05/23/23  1604     History  Chief Complaint  Patient presents with   Abdominal Pain    Theresa [name] is a 68 y.o. female.  Patient is a 68 year old female who presents to the emergency department the chief complaint of left-sided and epigastric Donnell pain which has been ongoing for approximate the past 2 months.  Patient notes that she has been treating it at home with stool softeners that she did think that she was constipated.  She has also been recently on Augmentin  from her PCP for possible diverticulitis.  She has completed these antibiotics.  Patient notes that she has had no associated melena, hematochezia.  She denies any associated nausea or vomiting.  She denies any abnormal vaginal bleeding.  She has had no dysuria or hematuria.   Abdominal Pain      Home Medications Prior to Admission medications   Medication Sig Start Date End Date Taking? Authorizing Provider  amoxicillin -clavulanate (AUGMENTIN ) 875-125 MG tablet Take 1 tablet by mouth 2 (two) times daily. 04/13/23   Saguier, Gaylin Ke, PA-C  amphetamine -dextroamphetamine (ADDERALL XR) 20 MG 24 hr capsule Take 1 capsule (20 mg total) by mouth every morning. 04/30/23   Crecencio Dodge, Yvonne R, DO  buPROPion  (WELLBUTRIN  XL) 300 MG 24 hr tablet TAKE 1 TABLET EVERY DAY 02/12/23   Lowne Chase, Yvonne R, DO  diazepam  (VALIUM ) 5 MG tablet Take 1 tablet (5 mg total) by mouth at bedtime as needed. 04/30/23   Roel Clarity R, DO  lactulose  (CHRONULAC ) 10 GM/15ML solution TAKE 15 MLS (10 G TOTAL) BY MOUTH DAILY AS NEEDED FOR MILD CONSTIPATION. 03/05/23   Lowne Chase, Yvonne R, DO  levocetirizine (XYZAL ) 5 MG tablet TAKE 1 TABLET EVERY DAY IN THE EVENING 11/16/22   Crecencio Dodge, Yvonne R, DO  lisdexamfetamine (VYVANSE ) 70 MG capsule Take 1 capsule (70 mg total) by mouth daily. 03/22/23   Roel Clarity R, DO  pantoprazole   (PROTONIX ) 40 MG tablet TAKE 1 TABLET EVERY DAY 02/12/23   Crecencio Dodge, Yvonne R, DO  traZODone  (DESYREL ) 50 MG tablet TAKE 2 TABLETS EVERY DAY AT BEDTIME AS NEEDED 02/15/23   Estill Hemming, DO      Allergies    Nitroglycerin, Hydrocodone -acetaminophen , and Oxycodone     Review of Systems   Review of Systems  Gastrointestinal:  Positive for abdominal pain.  All other systems reviewed and are negative.   Physical Exam Updated Vital Signs BP (!) 150/95 (BP Location: Right Arm)   Pulse 69   Temp (!) 97.5 F (36.4 C)   Resp 16   Ht 5\' 4"  (1.626 m)   Wt 88.6 kg   SpO2 100%   BMI 33.53 kg/m  Physical Exam Vitals and nursing note reviewed.  Constitutional:      Appearance: Normal appearance.  HENT:     Head: Normocephalic and atraumatic.     Nose: Nose normal.     Mouth/Throat:     Mouth: Mucous membranes are moist.  Eyes:     Extraocular Movements: Extraocular movements intact.     Conjunctiva/sclera: Conjunctivae normal.     Pupils: Pupils are equal, round, and reactive to light.  Cardiovascular:     Rate and Rhythm: Normal rate and regular rhythm.     Pulses: Normal pulses.     Heart sounds: Normal heart sounds. No murmur heard.    No  gallop.  Pulmonary:     Effort: Pulmonary effort is normal. No respiratory distress.     Breath sounds: Normal breath sounds. No stridor. No wheezing, rhonchi or rales.  Abdominal:     General: Abdomen is flat. Bowel sounds are normal. There is no distension. There are no signs of injury.     Palpations: Abdomen is soft. There is no mass.     Tenderness: There is abdominal tenderness in the epigastric area, left upper quadrant and left lower quadrant. There is no guarding. Negative signs include Murphy's sign and McBurney's sign.     Hernia: No hernia is present.  Musculoskeletal:        General: Normal range of motion.     Cervical back: Normal range of motion and neck supple.  Skin:    General: Skin is warm and dry.     Findings:  No erythema or rash.  Neurological:     General: No focal deficit present.     Mental Status: She is alert and oriented to person, place, and time. Mental status is at baseline.  Psychiatric:        Mood and Affect: Mood normal.        Behavior: Behavior normal.        Thought Content: Thought content normal.        Judgment: Judgment normal.     ED Results / Procedures / Treatments   Labs (all labs ordered are listed, but only abnormal results are displayed) Labs Reviewed  COMPREHENSIVE METABOLIC PANEL WITH GFR - Abnormal; Notable for the following components:      Result Value   Glucose, Bld 114 (*)    All other components within normal limits  CBC WITH DIFFERENTIAL/PLATELET - Abnormal; Notable for the following components:   Hemoglobin 11.6 (*)    RDW 15.9 (*)    All other components within normal limits  URINALYSIS, ROUTINE W REFLEX MICROSCOPIC - Abnormal; Notable for the following components:   Color, Urine AMBER (*)    APPearance HAZY (*)    Hgb urine dipstick SMALL (*)    Leukocytes,Ua SMALL (*)    Bacteria, UA RARE (*)    All other components within normal limits  LIPASE, BLOOD    EKG None  Radiology No results found.  Procedures Procedures    Medications Ordered in ED Medications  morphine  (PF) 4 MG/ML injection 4 mg (4 mg Intravenous Given 05/23/23 2119)  ondansetron  (ZOFRAN ) injection 4 mg (4 mg Intravenous Given 05/23/23 2119)    ED Course/ Medical Decision Making/ A&P                                 Medical Decision Making Amount and/or Complexity of Data Reviewed Radiology: ordered.  Risk Prescription drug management.   This patient presents to the ED for concern of abdominal pain differential diagnosis includes acute appendicitis, cholecystitis, bowel torsion, diverticulitis, ovarian torsion or cyst, PID, tumor and abscess, pyelonephritis, kidney stone, pancreatitis, mesenteric ischemia    Additional history obtained:  Additional  history obtained from medical records External records from outside source obtained and reviewed including medical records   Lab Tests:  I Ordered, and personally interpreted labs.  The pertinent results include: No leukocytosis, mild anemia, normal kidney function liver function, normal electrolytes, negative lipase, unremarkable urinalysis   Imaging Studies ordered:  I ordered imaging studies including CT scan of the abdomen and pelvis I independently  visualized and interpreted imaging which showed sigmoid diverticulitis I agree with the radiologist interpretation   Medicines ordered and prescription drug management:  I ordered medication including Cipro and Flagyl for diverticulitis Reevaluation of the patient after these medicines showed that the patient improved I have reviewed the patients home medicines and have made adjustments as needed   Problem List / ED Course:  Patient is doing well at this time and is stable for discharge home.  Discussed with patient CT scan is concerning for diverticulitis.  Patient did complete her Augmentin  approximately 1 month ago and we will change her to Cipro and Flagyl at this point.  Vital signs are stable with no indication for sepsis.  She has no associated leukocytosis at this point.  CT scan demonstrated no signs of abscess or perforation.  Do not state that admission is warranted at this time.  The need for close follow-up with primary care doctor was discussed as well as strict turn precautions for any new or worsening symptoms.  Patient voiced understanding the plan and had no additional questions. Patient was evaluated by attending physician who is in agreement to plan at this time.    Social Determinants of Health:             Final Clinical Impression(s) / ED Diagnoses Final diagnoses:  None    Rx / DC Orders ED Discharge Orders     None         Emmalene Hare 05/23/23 2339    Almond Army,  MD 05/24/23 2100

## 2023-05-23 NOTE — ED Triage Notes (Signed)
 Abd pain for months with diarrhea she has not seen her doctor

## 2023-05-23 NOTE — ED Notes (Addendum)
 Patient SPO2 saturation 88% on room air, Patient denies SOB at this time, A&O x4, GCS 15, Respirations even and unlabored, NSR on the cardiac monitor, Patient placed on 2L via nasal canula, SPO2 increased to 91%, HOB elevated to 30 degrees. Bed locked in lowest possible position, call light within reach. SPO2 saturations maintain at 99% with consistent waveform, on 2L via nasal cannula. (Vitals in chart)

## 2023-05-23 NOTE — Discharge Instructions (Signed)
 Please take all antibiotics as directed.  Follow-up closely with your primary care doctor on an outpatient basis for reevaluation.  Return to emergency department immediately for any new or worsening symptoms.

## 2023-05-28 DIAGNOSIS — M5441 Lumbago with sciatica, right side: Secondary | ICD-10-CM | POA: Diagnosis not present

## 2023-05-28 DIAGNOSIS — M9903 Segmental and somatic dysfunction of lumbar region: Secondary | ICD-10-CM | POA: Diagnosis not present

## 2023-05-28 DIAGNOSIS — M9904 Segmental and somatic dysfunction of sacral region: Secondary | ICD-10-CM | POA: Diagnosis not present

## 2023-05-28 DIAGNOSIS — M5432 Sciatica, left side: Secondary | ICD-10-CM | POA: Diagnosis not present

## 2023-05-30 DIAGNOSIS — M9904 Segmental and somatic dysfunction of sacral region: Secondary | ICD-10-CM | POA: Diagnosis not present

## 2023-05-30 DIAGNOSIS — M5432 Sciatica, left side: Secondary | ICD-10-CM | POA: Diagnosis not present

## 2023-05-30 DIAGNOSIS — M5441 Lumbago with sciatica, right side: Secondary | ICD-10-CM | POA: Diagnosis not present

## 2023-05-30 DIAGNOSIS — M9903 Segmental and somatic dysfunction of lumbar region: Secondary | ICD-10-CM | POA: Diagnosis not present

## 2023-06-12 ENCOUNTER — Telehealth (INDEPENDENT_AMBULATORY_CARE_PROVIDER_SITE_OTHER): Admitting: Family Medicine

## 2023-06-12 VITALS — BP 136/84 | HR 85 | Temp 97.8°F

## 2023-06-12 DIAGNOSIS — F988 Other specified behavioral and emotional disorders with onset usually occurring in childhood and adolescence: Secondary | ICD-10-CM | POA: Diagnosis not present

## 2023-06-12 DIAGNOSIS — G4700 Insomnia, unspecified: Secondary | ICD-10-CM | POA: Diagnosis not present

## 2023-06-12 MED ORDER — AMPHETAMINE-DEXTROAMPHET ER 30 MG PO CP24
30.0000 mg | ORAL_CAPSULE | ORAL | 0 refills | Status: DC
Start: 2023-06-12 — End: 2023-07-09

## 2023-06-12 NOTE — Progress Notes (Signed)
 MyChart Video Visit    Virtual Visit via Video Note   This patient is at least at moderate risk for complications without adequate follow up. This format is felt to be most appropriate for this patient at this time. Physical exam was limited by quality of the video and audio technology used for the visit.  [name] was able to get the patient set up on a video visit.  Patient location: home Patient and provider in visit Provider location: Office  I discussed the limitations of evaluation and management by telemedicine and the availability of in person appointments. The patient expressed understanding and agreed to proceed.  Visit Date: 06/12/2023  Today's healthcare provider: Estill Hemming, DO     Subjective:    Patient ID: Theresa [name], female    DOB: [birth year], 68 y.o.   MRN: [id number]  Chief Complaint  Patient presents with   Medication Refill    HPI Patient is in today for add and insomnia.    Discussed the use of AI scribe software for clinical note transcription with the patient, who gave verbal consent to proceed.  History of Present Illness Theresa [name] is a 68 year old female who presents with issues related to her medication regimen and sleep disturbances.  She is experiencing difficulties with her current medication regimen, particularly with amphetamine  salts ER 20 mg once daily. She feels this dosage may not be sufficient compared to her previous dose of Vyvanse  60 mg. She describes experiencing 'brain fog' and feeling 'zoned' during the day, which she attributes to either insufficient dosage or poor sleep quality.  She has a long-standing issue with sleep disturbances and is currently taking diazepam  5 mg and trazodone  50 mg at bedtime. Initially, she took one trazodone  50 mg with diazepam , but the prescribed dose is two tablets at bedtime. However, taking the full dose leaves her feeling drowsy in the morning. Despite medication, she experiences  non-restorative sleep, waking up frequently during the night. She has undergone a sleep study in the past, though details of the findings were not discussed. She has tried various over-the-counter and natural remedies for sleep without success.  Her current medication regimen includes amphetamine  salts ER 20 mg once daily, diazepam  5 mg, and trazodone  50 mg at bedtime. She is concerned about the effectiveness and side effects of her current medications.    Past Medical History:  Diagnosis Date   ADD (attention deficit disorder)    Anemia    Arthritis    neck   Breast cancer (HCC)    Left breast   Complication of anesthesia    1 time "I felt everything" - approx 2005, - or "maybe 1986 or 2010- not sure"   Constipation    probotic    Depression    Diverticulitis    Family history of adverse reaction to anesthesia    other  and PG- wake up during surgey   Fibromyalgia    GERD (gastroesophageal reflux disease)    occ   History of blood transfusion    History of stomach ulcers    Hypertension    "white coat syndrome"   Migraines    migraine since age 63    Pleurisy    Pneumonia    age 75   PONV (postoperative nausea and vomiting)    Urinary incontinence     Past Surgical History:  Procedure Laterality Date   ANTERIOR CERVICAL DECOMPRESSION/DISCECTOMY FUSION 4 LEVELS N/A 08/01/2018  Procedure: ANTERIOR CERVICAL DECOMPRESSION FUSION, CERVICAL FOUR TO FIVE, CERVICAL FIVE TO SIX , CERVICAL SIX TO SEVEN WITH INSTRUMENTATION AND ALLOGRAFT;  Surgeon: Virl Grimes, MD;  Location: [facility] OR;  Service: Orthopedics;  Laterality: N/A;   APPENDECTOMY  1960   BREAST BIOPSY Left 1985   BREAST RECONSTRUCTION  1986 and 2010   x's 2   CHOLECYSTECTOMY  1987   COLONOSCOPY     KNEE ARTHROSCOPY WITH MEDIAL MENISECTOMY Left 06/15/2022   Procedure: KNEE ARTHROSCOPY WITH PARTIAL MEDIAL AND PARTIAL LATERAL MENISECTOMY, CHONDROPLASTY MEDIAL AND LATERAL;  Surgeon: Neil Balls, MD;  Location: [facility];  Service: Orthopedics;  Laterality: Left;   LASIK  2012   MASTECTOMY Left 1985    Family History  Problem Relation Age of Onset   Breast cancer Mother    Depression Mother    Heart failure Father    Depression Father    Arthritis Maternal Grandmother     Social History   Socioeconomic History   Marital status: Married    Spouse name: Not on file   Number of children: Not on file   Years of education: Not on file   Highest education level: Not on file  Occupational History   Occupation: Psychologist, counselling    Employer: [name]  Tobacco Use   Smoking status: Never   Smokeless tobacco: Never  Vaping Use   Vaping status: Never Used  Substance and Sexual Activity   Alcohol use: Yes    Comment: occasionally   Drug use: No   Sexual activity: Yes  Other Topics Concern   Not on file  Social History Narrative   Not on file   Social Drivers of Health   Financial Resource Strain: Low Risk  (04/12/2021)   Overall Financial Resource Strain (CARDIA)    Difficulty of Paying Living Expenses: Not hard at all  Food Insecurity: No Food Insecurity (04/12/2021)   Hunger Vital Sign    Worried About Running Out of Food in the Last Year: Never true    Ran Out of Food in the Last Year: Never true  Transportation Needs: No Transportation Needs (04/12/2021)   PRAPARE - Administrator, Civil Service (Medical): No    Lack of Transportation (Non-Medical): No  Physical Activity: Not on file  Stress: No Stress Concern Present (04/12/2021)   [employer] of Occupational Health - Occupational Stress Questionnaire    Feeling of Stress : Not at all  Social Connections: Unknown (05/20/2021)   Received from [facility]   Social Network    Social Network: Not on file  Intimate Partner Violence: Not At Risk (04/12/2021)   Humiliation, Afraid, Rape, and Kick questionnaire    Fear of Current or Ex-Partner: No    Emotionally Abused: No     Physically Abused: No    Sexually Abused: No    Outpatient Medications Prior to Visit  Medication Sig Dispense Refill   amoxicillin -clavulanate (AUGMENTIN ) 875-125 MG tablet Take 1 tablet by mouth 2 (two) times daily. 14 tablet 0   buPROPion  (WELLBUTRIN  XL) 300 MG 24 hr tablet TAKE 1 TABLET EVERY DAY 90 tablet 3   diazepam  (VALIUM ) 5 MG tablet Take 1 tablet (5 mg total) by mouth at bedtime as needed. 30 tablet 1   levocetirizine (XYZAL ) 5 MG tablet TAKE 1 TABLET EVERY DAY IN THE EVENING 90 tablet 3   lisdexamfetamine (VYVANSE ) 70 MG capsule Take 1 capsule (70 mg total) by mouth daily. 90  capsule 0   pantoprazole  (PROTONIX ) 40 MG tablet TAKE 1 TABLET EVERY DAY 90 tablet 3   traZODone  (DESYREL ) 50 MG tablet TAKE 2 TABLETS EVERY DAY AT BEDTIME AS NEEDED 180 tablet 2   amphetamine -dextroamphetamine (ADDERALL XR) 20 MG 24 hr capsule Take 1 capsule (20 mg total) by mouth every morning. 90 capsule 0   lactulose  (CHRONULAC ) 10 GM/15ML solution TAKE 15 MLS (10 G TOTAL) BY MOUTH DAILY AS NEEDED FOR MILD CONSTIPATION. 1416 mL 1   No facility-administered medications prior to visit.    Allergies  Allergen Reactions   Nitroglycerin     Migraine   Hydrocodone -Acetaminophen  Itching   Oxycodone  Itching    Review of Systems  Constitutional:  Positive for malaise/fatigue. Negative for fever.  HENT:  Negative for congestion.   Eyes:  Negative for blurred vision.  Respiratory:  Negative for cough and shortness of breath.   Cardiovascular:  Negative for chest pain, palpitations and leg swelling.  Gastrointestinal:  Negative for vomiting.  Musculoskeletal:  Negative for back pain.  Skin:  Negative for rash.  Neurological:  Negative for loss of consciousness and headaches.  Psychiatric/Behavioral:         Inc brain fog Dec concentration       Objective:     Physical Exam Vitals and nursing note reviewed.  Neurological:     Mental Status: She is alert.  Psychiatric:        Mood and Affect:  Mood normal.        Behavior: Behavior normal.        Thought Content: Thought content normal.        Judgment: Judgment normal.     Comments: [employer] brain fog     BP 136/84   Pulse 85   Temp 97.8 F (36.6 C)  Wt Readings from Last 3 Encounters:  05/23/23 195 lb 5.2 oz (88.6 kg)  07/31/22 195 lb 6.4 oz (88.6 kg)  06/15/22 191 lb 2.2 oz (86.7 kg)       Assessment & Plan:  Attention deficit disorder (ADD) without hyperactivity -     Amphetamine -Dextroamphet ER; Take 1 capsule (30 mg total) by mouth every morning.  Dispense: 30 capsule; Refill: 0  Insomnia, unspecified type   Assessment and Plan Assessment & Plan Insomnia   Chronic insomnia continues despite diazepam  5 mg and trazodone  50 mg, causing morning drowsiness and non-restorative sleep. Increasing trazodone  worsens drowsiness, and previous treatments have failed. Newer medications, Belsomra and QUVIVIQ, were discussed as alternatives, pending insurance coverage. Verify insurance for these medications. If covered, start Belsomra at 10 mg, titrating to 20 mg as needed. Consider QUVIVIQ starting at 25 mg, titrating to 50 mg as needed.  Attention Deficit Hyperactivity Disorder (ADHD)   She is currently on amphetamine  salts ER 20 mg daily, experiencing brain fog and reduced efficacy compared to previous Vyvanse  60 mg. There are concerns about the current dose affecting alertness and cognitive function. Increase amphetamine  salts ER to 30 mg daily and monitor for efficacy and adverse effects.  Medication Management   There are challenges with the current medication regimen due to potential interactions affecting ADHD and insomnia management. Adjustments are necessary based on her feedback and insurance coverage for new medications. Optimize the medication regimen accordingly.    I discussed the assessment and treatment plan with the patient. The patient was provided an opportunity to ask questions and all were answered.  The patient agreed with the plan and demonstrated an understanding of the  instructions.   The patient was advised to call back or seek an in-person evaluation if the symptoms worsen or if the condition fails to improve as anticipated.  Estill Hemming, DO [facility] at [facility] [phone] (phone) [phone] (fax)  [facility]

## 2023-06-13 DIAGNOSIS — N6481 Ptosis of breast: Secondary | ICD-10-CM | POA: Diagnosis not present

## 2023-06-13 DIAGNOSIS — Z853 Personal history of malignant neoplasm of breast: Secondary | ICD-10-CM | POA: Diagnosis not present

## 2023-06-13 DIAGNOSIS — Z9013 Acquired absence of bilateral breasts and nipples: Secondary | ICD-10-CM | POA: Diagnosis not present

## 2023-06-13 DIAGNOSIS — K219 Gastro-esophageal reflux disease without esophagitis: Secondary | ICD-10-CM | POA: Diagnosis not present

## 2023-06-13 DIAGNOSIS — E669 Obesity, unspecified: Secondary | ICD-10-CM | POA: Diagnosis not present

## 2023-06-13 DIAGNOSIS — N644 Mastodynia: Secondary | ICD-10-CM | POA: Diagnosis not present

## 2023-06-13 DIAGNOSIS — Z6831 Body mass index (BMI) 31.0-31.9, adult: Secondary | ICD-10-CM | POA: Diagnosis not present

## 2023-06-13 DIAGNOSIS — N651 Disproportion of reconstructed breast: Secondary | ICD-10-CM | POA: Diagnosis not present

## 2023-06-21 DIAGNOSIS — H02822 Cysts of right lower eyelid: Secondary | ICD-10-CM | POA: Diagnosis not present

## 2023-06-21 DIAGNOSIS — H17813 Minor opacity of cornea, bilateral: Secondary | ICD-10-CM | POA: Diagnosis not present

## 2023-06-21 DIAGNOSIS — H524 Presbyopia: Secondary | ICD-10-CM | POA: Diagnosis not present

## 2023-06-21 DIAGNOSIS — H5203 Hypermetropia, bilateral: Secondary | ICD-10-CM | POA: Diagnosis not present

## 2023-06-22 ENCOUNTER — Telehealth: Payer: Self-pay | Admitting: Family Medicine

## 2023-06-22 NOTE — Telephone Encounter (Signed)
 Copied from CRM [id number]. Topic: Clinical - Prescription Issue >> Jun 22, 2023  3:11 PM Chuck Crater wrote: Reason for CRM: Vivian Groom is calling to folow up on a medication request for Belsomra 20 mg. She stated that it was faxed on 06/05.

## 2023-06-25 ENCOUNTER — Other Ambulatory Visit: Payer: Self-pay | Admitting: Family Medicine

## 2023-06-25 DIAGNOSIS — G4700 Insomnia, unspecified: Secondary | ICD-10-CM

## 2023-06-25 MED ORDER — BELSOMRA 20 MG PO TABS: 20.0000 mg | ORAL_TABLET | Freq: Every evening | ORAL | 1 refills | Status: DC | PRN

## 2023-06-25 NOTE — Telephone Encounter (Signed)
 Please advise? Belsomra not on med list?

## 2023-07-04 ENCOUNTER — Telehealth: Payer: Self-pay | Admitting: Family Medicine

## 2023-07-04 NOTE — Telephone Encounter (Signed)
 Copied from CRM [id number]. Topic: Medicare AWV >> Jul 04, 2023  3:15 PM Nathanel DEL wrote: Reason for CRM: Called 07/04/2023 to sched AWV - MAILBOX FULL  Nathanel Paschal; Care Guide Ambulatory Clinical Support  l [facility] Direct Dial: [phone]

## 2023-07-09 ENCOUNTER — Other Ambulatory Visit: Payer: Self-pay | Admitting: Family Medicine

## 2023-07-09 DIAGNOSIS — G4700 Insomnia, unspecified: Secondary | ICD-10-CM

## 2023-07-09 DIAGNOSIS — F988 Other specified behavioral and emotional disorders with onset usually occurring in childhood and adolescence: Secondary | ICD-10-CM

## 2023-07-09 NOTE — Telephone Encounter (Signed)
 Requesting: Adderall XR 30mg  and diazepam  5mg   Contract: 03/26/20 UDS: 03/26/20 Last Visit: 06/12/23 Next Visit: None Last Refill: see med list   Please Advise

## 2023-07-09 NOTE — Telephone Encounter (Unsigned)
 Copied from CRM [id number]. Topic: Clinical - Medication Refill >> Jul 09, 2023  2:56 PM Martinique E wrote: Medication: amphetamine -dextroamphetamine (ADDERALL XR) 30 MG 24 hr capsule diazepam  (VALIUM ) 5 MG tablet  Has the patient contacted their pharmacy? No (Agent: If no, request that the patient contact the pharmacy for the refill. If patient does not wish to contact the pharmacy document the reason why and proceed with request.) (Agent: If yes, when and what did the pharmacy advise?)  This is the patient's preferred pharmacy:  [id number] - [place], [place] - [address] [place] [address] Phone: [phone] Fax: [phone]  Is this the correct pharmacy for this prescription? Yes If no, delete pharmacy and type the correct one.   Has the prescription been filled recently? No  Is the patient out of the medication? Out of Diazepam , but 5 pills left of the Adderall.  Has the patient been seen for an appointment in the last year OR does the patient have an upcoming appointment? Yes  Can we respond through MyChart? No, currently trying to get it to work.  Agent: Please be advised that Rx refills may take up to 3 business days. We ask that you follow-up with your pharmacy.

## 2023-07-10 MED ORDER — AMPHETAMINE-DEXTROAMPHET ER 30 MG PO CP24: 30.0000 mg | ORAL_CAPSULE | ORAL | 0 refills | Status: DC

## 2023-07-10 MED ORDER — DIAZEPAM 5 MG PO TABS: 5.0000 mg | ORAL_TABLET | Freq: Every evening | ORAL | 1 refills | Status: DC | PRN

## 2023-07-20 DIAGNOSIS — R300 Dysuria: Secondary | ICD-10-CM | POA: Diagnosis not present

## 2023-07-20 DIAGNOSIS — R350 Frequency of micturition: Secondary | ICD-10-CM | POA: Diagnosis not present

## 2023-08-13 ENCOUNTER — Encounter: Payer: Self-pay | Admitting: Family Medicine

## 2023-08-13 ENCOUNTER — Other Ambulatory Visit: Payer: Self-pay | Admitting: Family Medicine

## 2023-08-13 DIAGNOSIS — F988 Other specified behavioral and emotional disorders with onset usually occurring in childhood and adolescence: Secondary | ICD-10-CM

## 2023-08-13 MED ORDER — AMPHETAMINE-DEXTROAMPHET ER 20 MG PO CP24: ORAL_CAPSULE | ORAL | 0 refills | Status: DC

## 2023-08-22 DIAGNOSIS — S39012A Strain of muscle, fascia and tendon of lower back, initial encounter: Secondary | ICD-10-CM | POA: Diagnosis not present

## 2023-08-22 DIAGNOSIS — R911 Solitary pulmonary nodule: Secondary | ICD-10-CM | POA: Diagnosis not present

## 2023-08-22 DIAGNOSIS — S7002XA Contusion of left hip, initial encounter: Secondary | ICD-10-CM | POA: Diagnosis not present

## 2023-08-22 DIAGNOSIS — M79662 Pain in left lower leg: Secondary | ICD-10-CM | POA: Diagnosis not present

## 2023-08-22 DIAGNOSIS — M25552 Pain in left hip: Secondary | ICD-10-CM | POA: Diagnosis not present

## 2023-08-22 DIAGNOSIS — M546 Pain in thoracic spine: Secondary | ICD-10-CM | POA: Diagnosis not present

## 2023-08-22 DIAGNOSIS — S76012A Strain of muscle, fascia and tendon of left hip, initial encounter: Secondary | ICD-10-CM | POA: Diagnosis not present

## 2023-08-22 DIAGNOSIS — N2889 Other specified disorders of kidney and ureter: Secondary | ICD-10-CM | POA: Diagnosis not present

## 2023-08-22 DIAGNOSIS — W108XXA Fall (on) (from) other stairs and steps, initial encounter: Secondary | ICD-10-CM | POA: Diagnosis not present

## 2023-08-22 DIAGNOSIS — S79919A Unspecified injury of unspecified hip, initial encounter: Secondary | ICD-10-CM | POA: Diagnosis not present

## 2023-08-22 DIAGNOSIS — S300XXA Contusion of lower back and pelvis, initial encounter: Secondary | ICD-10-CM | POA: Diagnosis not present

## 2023-08-22 DIAGNOSIS — R0789 Other chest pain: Secondary | ICD-10-CM | POA: Diagnosis not present

## 2023-08-23 DIAGNOSIS — S300XXA Contusion of lower back and pelvis, initial encounter: Secondary | ICD-10-CM | POA: Diagnosis not present

## 2023-08-23 DIAGNOSIS — N2889 Other specified disorders of kidney and ureter: Secondary | ICD-10-CM | POA: Diagnosis not present

## 2023-08-23 DIAGNOSIS — M546 Pain in thoracic spine: Secondary | ICD-10-CM | POA: Diagnosis not present

## 2023-08-23 DIAGNOSIS — R911 Solitary pulmonary nodule: Secondary | ICD-10-CM | POA: Diagnosis not present

## 2023-08-23 LAB — LAB REPORT - SCANNED: EGFR: 79

## 2023-08-31 ENCOUNTER — Other Ambulatory Visit: Payer: Self-pay | Admitting: Family Medicine

## 2023-08-31 DIAGNOSIS — G4700 Insomnia, unspecified: Secondary | ICD-10-CM

## 2023-09-23 ENCOUNTER — Encounter: Payer: Self-pay | Admitting: Family Medicine

## 2023-09-23 DIAGNOSIS — F988 Other specified behavioral and emotional disorders with onset usually occurring in childhood and adolescence: Secondary | ICD-10-CM

## 2023-09-23 DIAGNOSIS — G4700 Insomnia, unspecified: Secondary | ICD-10-CM

## 2023-09-23 DIAGNOSIS — K2970 Gastritis, unspecified, without bleeding: Secondary | ICD-10-CM

## 2023-09-24 NOTE — Telephone Encounter (Signed)
 Requesting: Vyvanse  and diazepam   Contract: 03/26/20 UDS: 03/26/20 Last Visit: 06/12/23 Next Visit:None Last Refill: see med list  Please Advise

## 2023-09-25 ENCOUNTER — Other Ambulatory Visit: Payer: Self-pay | Admitting: Family Medicine

## 2023-09-25 DIAGNOSIS — G4700 Insomnia, unspecified: Secondary | ICD-10-CM

## 2023-09-25 DIAGNOSIS — F988 Other specified behavioral and emotional disorders with onset usually occurring in childhood and adolescence: Secondary | ICD-10-CM

## 2023-09-25 MED ORDER — PANTOPRAZOLE SODIUM 40 MG PO TBEC: 40.0000 mg | DELAYED_RELEASE_TABLET | Freq: Every day | ORAL | 1 refills | Status: AC

## 2023-09-25 MED ORDER — LISDEXAMFETAMINE DIMESYLATE 70 MG PO CAPS
70.0000 mg | ORAL_CAPSULE | Freq: Every day | ORAL | 0 refills | Status: DC
Start: 2023-09-25 — End: 2023-10-10

## 2023-09-25 MED ORDER — DIAZEPAM 5 MG PO TABS: 5.0000 mg | ORAL_TABLET | Freq: Every evening | ORAL | 1 refills | Status: DC | PRN

## 2023-09-26 ENCOUNTER — Other Ambulatory Visit: Payer: Self-pay

## 2023-10-09 ENCOUNTER — Telehealth: Payer: Self-pay

## 2023-10-09 ENCOUNTER — Telehealth: Payer: Self-pay | Admitting: Family Medicine

## 2023-10-09 NOTE — Telephone Encounter (Signed)
 Copied from CRM #[id number]. Topic: Medicare AWV >> Oct 09, 2023  2:30 PM Nathanel DEL wrote: Reason for CRM: Called LVM 10/09/2023 to schedule AWV. Please schedule office or virtual visits.  Nathanel Paschal; Care Guide Ambulatory Clinical Support [place] [facility] Direct Dial: [phone]

## 2023-10-09 NOTE — Telephone Encounter (Signed)
 Copied from CRM #[id number]. Topic: Clinical - Prescription Issue >> Oct 09, 2023  4:50 PM Debby BROCKS wrote: Reason for CRM: Patient sent a message to refill: Diazepam  5 mg (30day) Pantoprazole  DR 40 mg (90 days) Amphetamine  salts ER mg 2- 20mg  daily (90 day)  However instead they refilled Vyvanse  but no diazepam  because it was too early and the Adderall was missed. She started taking the Vyvanse  because she thought it was her new Adderall and she feels bad. Looking at the messages it seems vyvanse  was placed accidentally and it needed to be canceled for Adderall but it does not appear to have gone through as patient received the vyvanse  and no Adderall.    She would like to receive her Adderall and Diazepam  and know what to do with vyvanse 

## 2023-10-10 ENCOUNTER — Other Ambulatory Visit: Payer: Self-pay | Admitting: Family Medicine

## 2023-10-10 DIAGNOSIS — F988 Other specified behavioral and emotional disorders with onset usually occurring in childhood and adolescence: Secondary | ICD-10-CM

## 2023-10-10 MED ORDER — AMPHETAMINE-DEXTROAMPHET ER 20 MG PO CP24: ORAL_CAPSULE | ORAL | 0 refills | Status: DC

## 2023-10-10 NOTE — Telephone Encounter (Signed)
 Patient notified correct medication has been sent in for her

## 2023-10-24 DIAGNOSIS — H17813 Minor opacity of cornea, bilateral: Secondary | ICD-10-CM | POA: Diagnosis not present

## 2023-10-24 DIAGNOSIS — H02822 Cysts of right lower eyelid: Secondary | ICD-10-CM | POA: Diagnosis not present

## 2023-10-24 DIAGNOSIS — H5203 Hypermetropia, bilateral: Secondary | ICD-10-CM | POA: Diagnosis not present

## 2023-10-24 DIAGNOSIS — H524 Presbyopia: Secondary | ICD-10-CM | POA: Diagnosis not present

## 2023-11-08 DIAGNOSIS — H02413 Mechanical ptosis of bilateral eyelids: Secondary | ICD-10-CM | POA: Diagnosis not present

## 2023-11-08 DIAGNOSIS — H02423 Myogenic ptosis of bilateral eyelids: Secondary | ICD-10-CM | POA: Diagnosis not present

## 2023-11-08 DIAGNOSIS — H53483 Generalized contraction of visual field, bilateral: Secondary | ICD-10-CM | POA: Diagnosis not present

## 2023-11-08 DIAGNOSIS — H0279 Other degenerative disorders of eyelid and periocular area: Secondary | ICD-10-CM | POA: Diagnosis not present

## 2023-11-08 DIAGNOSIS — Z01818 Encounter for other preprocedural examination: Secondary | ICD-10-CM | POA: Diagnosis not present

## 2023-11-08 DIAGNOSIS — H57813 Brow ptosis, bilateral: Secondary | ICD-10-CM | POA: Diagnosis not present

## 2023-11-08 DIAGNOSIS — D485 Neoplasm of uncertain behavior of skin: Secondary | ICD-10-CM | POA: Diagnosis not present

## 2023-11-08 DIAGNOSIS — H02834 Dermatochalasis of left upper eyelid: Secondary | ICD-10-CM | POA: Diagnosis not present

## 2023-11-08 DIAGNOSIS — H02831 Dermatochalasis of right upper eyelid: Secondary | ICD-10-CM | POA: Diagnosis not present

## 2023-11-12 ENCOUNTER — Other Ambulatory Visit: Payer: Self-pay | Admitting: Family Medicine

## 2023-11-12 ENCOUNTER — Encounter: Payer: Self-pay | Admitting: Family Medicine

## 2023-11-12 DIAGNOSIS — G4700 Insomnia, unspecified: Secondary | ICD-10-CM

## 2023-11-12 DIAGNOSIS — F988 Other specified behavioral and emotional disorders with onset usually occurring in childhood and adolescence: Secondary | ICD-10-CM

## 2023-11-12 MED ORDER — AMPHETAMINE-DEXTROAMPHET ER 20 MG PO CP24: ORAL_CAPSULE | ORAL | 0 refills | Status: DC

## 2023-11-12 MED ORDER — DIAZEPAM 5 MG PO TABS: 5.0000 mg | ORAL_TABLET | Freq: Every evening | ORAL | 1 refills | Status: DC | PRN

## 2023-11-12 NOTE — Telephone Encounter (Signed)
 Requesting: diazepam  and Adderall  Contract:10/23/19 UDS: 03/26/20 Last Visit: 06/12/23 Next Visit: None Last Refill: see med list  Please Advise

## 2023-12-06 ENCOUNTER — Encounter: Payer: Self-pay | Admitting: Family Medicine

## 2023-12-06 DIAGNOSIS — F988 Other specified behavioral and emotional disorders with onset usually occurring in childhood and adolescence: Secondary | ICD-10-CM

## 2023-12-06 DIAGNOSIS — G4700 Insomnia, unspecified: Secondary | ICD-10-CM

## 2023-12-10 MED ORDER — AMPHETAMINE-DEXTROAMPHET ER 20 MG PO CP24: ORAL_CAPSULE | ORAL | 0 refills | Status: DC

## 2023-12-10 MED ORDER — TRAZODONE HCL 50 MG PO TABS: ORAL_TABLET | ORAL | 2 refills | Status: DC

## 2023-12-10 NOTE — Telephone Encounter (Signed)
 Requesting: Adderall xr Contract: 03/22 UDS: 03/22 Last OV: 01/15/2023 Next OV: n/a Last Refill: 11/12/2023, #60--0 RF Database:   Please advise

## 2023-12-13 ENCOUNTER — Encounter: Payer: Self-pay | Admitting: Family Medicine

## 2023-12-13 ENCOUNTER — Other Ambulatory Visit: Payer: Self-pay | Admitting: Family Medicine

## 2023-12-13 DIAGNOSIS — F988 Other specified behavioral and emotional disorders with onset usually occurring in childhood and adolescence: Secondary | ICD-10-CM

## 2023-12-13 MED ORDER — AMPHETAMINE-DEXTROAMPHET ER 20 MG PO CP24: ORAL_CAPSULE | ORAL | 0 refills | Status: DC

## 2023-12-13 NOTE — Telephone Encounter (Signed)
 Copied from CRM #[id number]. Topic: Clinical - Prescription Issue >> Dec 13, 2023  2:57 PM Robinson H wrote: Reason for CRM: Patient calling to notify provider that she found a pharmacy that has D-Amphetamine   Salt ER 20mg  medication in stock to have it filled. Pharmacy information is below, patient is out of medication.  [facility]  [address] [phone]  [place] [phone]

## 2023-12-21 ENCOUNTER — Other Ambulatory Visit: Payer: Self-pay | Admitting: Family Medicine

## 2023-12-21 ENCOUNTER — Encounter: Payer: Self-pay | Admitting: Family Medicine

## 2023-12-21 DIAGNOSIS — G4700 Insomnia, unspecified: Secondary | ICD-10-CM

## 2023-12-21 MED ORDER — DIAZEPAM 5 MG PO TABS: 5.0000 mg | ORAL_TABLET | Freq: Every evening | ORAL | 1 refills | Status: DC | PRN

## 2023-12-21 NOTE — Telephone Encounter (Signed)
 Requesting: diazepam  5mg  Contract: 03/26/20 UDS: 03/26/20 Last Visit: 06/12/23 Next Visit: None Last Refill: 11/12/23 #30 and 1RF  [facility]   Please Advise

## 2023-12-25 HISTORY — PX: BLEPHAROPLASTY: SUR158

## 2024-01-13 ENCOUNTER — Encounter: Payer: Self-pay | Admitting: Family Medicine

## 2024-01-13 DIAGNOSIS — F988 Other specified behavioral and emotional disorders with onset usually occurring in childhood and adolescence: Secondary | ICD-10-CM

## 2024-01-14 MED ORDER — AMPHETAMINE-DEXTROAMPHET ER 20 MG PO CP24: ORAL_CAPSULE | ORAL | 0 refills | Status: DC

## 2024-01-14 NOTE — Telephone Encounter (Signed)
 Requesting: Adderall XR Contract:  UDS: Last OV: 01/15/2023 Next OV: AWV on 01/15/2024 Last Refill: 12/13/2023, #60--0 RF Database:   Please advise

## 2024-01-15 ENCOUNTER — Ambulatory Visit (INDEPENDENT_AMBULATORY_CARE_PROVIDER_SITE_OTHER): Admitting: *Deleted

## 2024-01-15 ENCOUNTER — Telehealth: Payer: Self-pay | Admitting: *Deleted

## 2024-01-15 VITALS — BP 142/90 | HR 77 | Temp 97.6°F | Resp 16 | Ht 65.25 in | Wt 190.6 lb

## 2024-01-15 DIAGNOSIS — Z0000 Encounter for general adult medical examination without abnormal findings: Secondary | ICD-10-CM

## 2024-01-15 DIAGNOSIS — Z1211 Encounter for screening for malignant neoplasm of colon: Secondary | ICD-10-CM

## 2024-01-15 NOTE — Telephone Encounter (Signed)
 Pt had AWV today.  BP was 151/90 and 142/90 on recheck.  Denies chest pain, sob, dizziness. She has scheduled an OV with PCP on 01/24/24 and will check her BP at home the next 3 days and email us  the readings.

## 2024-01-15 NOTE — Progress Notes (Signed)
 "  Chief Complaint  Patient presents with   Medicare Wellness     Subjective:   Theresa [name] is a 69 y.o. female who presents for a Medicare Annual Wellness Visit.  Visit info / Clinical Intake: Medicare Wellness Visit Type:: Subsequent Annual Wellness Visit Persons participating in visit and providing information:: patient Medicare Wellness Visit Mode:: In-person (required for WTM) Interpreter Needed?: No Pre-visit prep was completed: yes AWV questionnaire completed by patient prior to visit?: yes Date:: 01/13/24 Living arrangements:: (Patient-Rptd) lives with spouse/significant other Patient's Overall Health Status Rating: (Patient-Rptd) good Typical amount of pain: (Patient-Rptd) some Does pain affect daily life?: (Patient-Rptd) no Are you currently prescribed opioids?: no  Dietary Habits and Nutritional Risks How many meals a day?: (Patient-Rptd) 2 Eats fruit and vegetables daily?: (Patient-Rptd) yes Most meals are obtained by: (Patient-Rptd) preparing own meals In the last 2 weeks, have you had any of the following?: none Diabetic:: no  Functional Status Activities of Daily Living (to include ambulation/medication): (Patient-Rptd) Independent Ambulation: (Patient-Rptd) Independent Medication Administration: (Patient-Rptd) Independent Home Management (perform basic housework or laundry): (Patient-Rptd) Independent Manage your own finances?: (Patient-Rptd) yes Primary transportation is: (Patient-Rptd) driving Concerns about vision?: no *vision screening is required for WTM* (up to date with [facility]) Concerns about hearing?: no (hearing checked 2 yrs ago and normal)  Fall Screening Falls in the past year?: 1 (fell down wet stairs from rain) Number of falls in past year: (Patient-Rptd) 0 Was there an injury with Fall?: (Patient-Rptd) 1 Fall Risk Category Calculator: (Patient-Rptd) 2 Patient Fall Risk Level: (Patient-Rptd) Moderate Fall Risk  Fall  Risk Patient at Risk for Falls Due to: Orthopedic patient Fall risk Follow up: Falls evaluation completed  Home and Transportation Safety: All rugs have non-skid backing?: (Patient-Rptd) yes All stairs or steps have railings?: (Patient-Rptd) yes Grab bars in the bathtub or shower?: (!) (Patient-Rptd) no Have non-skid surface in bathtub or shower?: (Patient-Rptd) yes Good home lighting?: (Patient-Rptd) yes Regular seat belt use?: (Patient-Rptd) yes Hospital stays in the last year:: (Patient-Rptd) no  Cognitive Assessment Difficulty concentrating, remembering, or making decisions? : yes (I have Fibrofog) Will 6CIT or Mini Cog be Completed: yes What year is it?: 0 points What month is it?: 0 points Give patient an address phrase to remember (5 components): [address] [place] About what time is it?: 0 points Count backwards from 20 to 1: 0 points Say the months of the year in reverse: 0 points Repeat the address phrase from earlier: 0 points 6 CIT Score: 0 points  Advance Directives (For Healthcare) Does Patient Have a Medical Advance Directive?: No Would patient like information on creating a medical advance directive?: Yes (MAU/Ambulatory/Procedural Areas - Information given)  Reviewed/Updated  Reviewed/Updated: Reviewed All (Medical, Surgical, Family, Medications, Allergies, Care Teams, Patient Goals)    Allergies (verified) Nitroglycerin, Hydrocodone -acetaminophen , and Oxycodone    Current Medications (verified) Outpatient Encounter Medications as of 01/15/2024  Medication Sig   amphetamine -dextroamphetamine (ADDERALL XR) 20 MG 24 hr capsule 2 po every day   diazepam  (VALIUM ) 5 MG tablet Take 1 tablet (5 mg total) by mouth at bedtime as needed.   levocetirizine (XYZAL ) 5 MG tablet TAKE 1 TABLET EVERY DAY IN THE EVENING   pantoprazole  (PROTONIX ) 40 MG tablet Take 1 tablet (40 mg total) by mouth daily.   Suvorexant  (BELSOMRA ) 20 MG TABS Take 1 tablet (20  mg total) by mouth at bedtime as needed.   traZODone  (DESYREL ) 50 MG tablet TAKE 2 TABLETS EVERY DAY AT  BEDTIME AS NEEDED   [DISCONTINUED] amoxicillin -clavulanate (AUGMENTIN ) 875-125 MG tablet Take 1 tablet by mouth 2 (two) times daily.   [DISCONTINUED] buPROPion  (WELLBUTRIN  XL) 300 MG 24 hr tablet TAKE 1 TABLET EVERY DAY   No facility-administered encounter medications on file as of 01/15/2024.    History: Past Medical History:  Diagnosis Date   ADD (attention deficit disorder)    Anemia    Arthritis    neck   Breast cancer (HCC)    Left breast   Complication of anesthesia    1 time I felt everything - approx 2005, - or maybe 1986 or 2010- not sure   Constipation    probotic    Depression    Diverticulitis    Family history of adverse reaction to anesthesia    other  and PG- wake up during surgey   Fibromyalgia    GERD (gastroesophageal reflux disease)    occ   History of blood transfusion    History of stomach ulcers    Hypertension    white coat syndrome   Migraines    migraine since age 37    Pleurisy    Pneumonia    age 23   PONV (postoperative nausea and vomiting)    Urinary incontinence    Past Surgical History:  Procedure Laterality Date   ANTERIOR CERVICAL DECOMPRESSION/DISCECTOMY FUSION 4 LEVELS N/A 08/01/2018   Procedure: ANTERIOR CERVICAL DECOMPRESSION FUSION, CERVICAL FOUR TO FIVE, CERVICAL FIVE TO SIX , CERVICAL SIX TO SEVEN WITH INSTRUMENTATION AND ALLOGRAFT;  Surgeon: Beuford Anes, MD;  Location: [facility] OR;  Service: Orthopedics;  Laterality: N/A;   APPENDECTOMY  1960   BLEPHAROPLASTY Bilateral 12/25/2023   [facility] for eyelid drooping   BREAST BIOPSY Left 1985   BREAST RECONSTRUCTION  1986 and 2010   x's 2   CHOLECYSTECTOMY  1987   COLONOSCOPY     KNEE ARTHROSCOPY WITH MEDIAL MENISECTOMY Left 06/15/2022   Procedure: KNEE ARTHROSCOPY WITH PARTIAL MEDIAL AND PARTIAL LATERAL MENISECTOMY, CHONDROPLASTY MEDIAL AND LATERAL;  Surgeon: Yvone Rush, MD;  Location: [facility];  Service: Orthopedics;  Laterality: Left;   LASIK  2012   MASTECTOMY Left 1985   Family History  Problem Relation Age of Onset   Breast cancer Mother    Depression Mother    Heart failure Father    Depression Father    Arthritis Maternal Grandmother    Social History   Occupational History   Occupation: Engineer, Site: [employer]  Tobacco Use   Smoking status: Never   Smokeless tobacco: Never  Vaping Use   Vaping status: Never Used  Substance and Sexual Activity   Alcohol use: Yes    Comment: occasionally   Drug use: No   Sexual activity: Yes   Tobacco Counseling Counseling given: Not Answered  SDOH Screenings   Food Insecurity: Food Insecurity Present (01/13/2024)  Housing: High Risk (01/13/2024)  Transportation Needs: No Transportation Needs (01/13/2024)  Utilities: Not At Risk (01/15/2024)  Alcohol Screen: Low Risk (01/13/2024)  Depression (PHQ2-9): Low Risk (01/15/2024)  Financial Resource Strain: Low Risk (01/13/2024)  Physical Activity: Insufficiently Active (01/13/2024)  Social Connections: Socially Integrated (01/13/2024)  Stress: No Stress Concern Present (01/13/2024)  Tobacco Use: Low Risk (01/15/2024)   See flowsheets for full screening details  Depression Screen PHQ 2 & 9 Depression Scale- Over the past 2 weeks, how often have you been bothered by any of the following problems? Little interest or pleasure in doing  things: 0 (had 2 deaths in the past year but no more than is normal for those situations) Feeling down, depressed, or hopeless (PHQ Adolescent also includes...irritable): 0 PHQ-2 Total Score: 0 Trouble falling or staying asleep, or sleeping too much: 3 Feeling tired or having little energy: 1 (Has been dealing with sciatica and moving) Poor appetite or overeating (PHQ Adolescent also includes...weight loss): 0 Feeling bad about yourself - or that you are a failure or have let yourself  or your family down: 0 Trouble concentrating on things, such as reading the newspaper or watching television (PHQ Adolescent also includes...like school work): 0 Moving or speaking so slowly that other people could have noticed. Or the opposite - being so fidgety or restless that you have been moving around a lot more than usual: 0 Thoughts that you would be better off dead, or of hurting yourself in some way: 0 PHQ-9 Total Score: 4 If you checked off any problems, how difficult have these problems made it for you to do your work, take care of things at home, or get along with other people?: Somewhat difficult     Goals Addressed   None          Objective:    Today's Vitals   01/15/24 0813 01/15/24 0854  BP: (!) 151/90 (!) 142/90  Pulse: 77   Resp: 16   Temp: 97.6 F (36.4 C)   TempSrc: Oral   SpO2: 95%   Weight: 190 lb 9.6 oz (86.5 kg)   Height: 5' 5.25 (1.657 m)    Body mass index is 31.47 kg/m.  Hearing/Vision screen No results found. Immunizations and Health Maintenance Health Maintenance  Topic Date Due   Fecal DNA (Cologuard)  Never done   Bone Density Scan  01/14/2025 (Originally 04/05/2019)   Medicare Annual Wellness (AWV)  01/14/2025   Hepatitis C Screening  Completed   Meningococcal B Vaccine  Aged Out   DTaP/Tdap/Td  Discontinued   Pneumococcal Vaccine: 50+ Years  Discontinued   Influenza Vaccine  Discontinued   Mammogram  Discontinued   Colonoscopy  Discontinued   COVID-19 Vaccine  Discontinued   Zoster Vaccines- Shingrix  Discontinued        Assessment/Plan:  This is a routine wellness examination for [place].  Patient Care Team: Antonio Meth, Jamee SAUNDERS, DO as PCP - General (Family Medicine)  I have personally reviewed and noted the following in the patients chart:   Medical and social history Use of alcohol, tobacco or illicit drugs  Current medications and supplements including opioid prescriptions. Functional ability and status Nutritional  status Physical activity Advanced directives List of other physicians Hospitalizations, surgeries, and ER visits in previous 12 months Vitals Screenings to include cognitive, depression, and falls Referrals and appointments  Orders Placed This Encounter  Procedures   Cologuard    Standing Status:   Future    Expected Date:   01/15/2024    Expiration Date:   01/14/2025   In addition, I have reviewed and discussed with patient certain preventive protocols, quality metrics, and best practice recommendations. A written personalized care plan for preventive services as well as general preventive health recommendations were provided to patient.   Lolita Libra, CMA   01/15/2024   Return in 1 year (on 01/14/2025).  After Visit Summary: (In Person-Printed) AVS printed and given to the patient  Nurse Notes: HM Addressed: Cologuard Ordered Pt declines all vaccines and screening tests except Cologuard.  See phone note  "

## 2024-01-15 NOTE — Telephone Encounter (Signed)
 FYI

## 2024-01-15 NOTE — Patient Instructions (Addendum)
 Theresa [name],  Thank you for taking the time for your Medicare Wellness Visit. I appreciate your continued commitment to your health goals. Please review the care plan we discussed, and feel free to reach out if I can assist you further.  Please note that Annual Wellness Visits do not include a physical exam. Some assessments may be limited, especially if the visit was conducted virtually. If needed, we may recommend an in-person follow-up with your provider.  Ongoing Care Seeing your primary care provider every 3 to 6 months helps us  monitor your health and provide consistent, personalized care.   Dr Antonio Meth: 01/24/24 9:40am Medicare AWV: 01/16/24  8:20am, in person +  Referrals If a referral was made during today's visit and you haven't received any updates within two weeks, please contact the referred provider directly to check on the status.  Cologuard:  has been ordered today. Let us  know if you do not receive your kit in the mail with 1-2 weeks.  Recommended Screenings:  You will need to get the following vaccines at your local pharmacy: Tetanus, pneumonia, shingles, flu (if you change your mind).  Health Maintenance  Topic Date Due   Cologuard (Stool DNA test)  Never done   Medicare Annual Wellness Visit  04/13/2022   Osteoporosis screening with Bone Density Scan  01/14/2025*   Hepatitis C Screening  Completed   Meningitis B Vaccine  Aged Out   DTaP/Tdap/Td vaccine  Discontinued   Pneumococcal Vaccine for age over 28  Discontinued   Flu Shot  Discontinued   Breast Cancer Screening  Discontinued   Colon Cancer Screening  Discontinued   COVID-19 Vaccine  Discontinued   Zoster (Shingles) Vaccine  Discontinued  *Topic was postponed. The date shown is not the original due date.       01/13/2024    2:44 PM  Advanced Directives  Does Patient Have a Medical Advance Directive? No  Would patient like information on creating a medical advance directive? Yes  (MAU/Ambulatory/Procedural Areas - Information given)  Once completed and notarized, you may return a copy of your Advanced Directive(s) by either of the following:  Bring a copy of your health care power of attorney and living will to the office to be added to your chart at your convenience. You can mail a copy to [facility] [address] [place], [place] [address] or email to [email or web address]   Vision: Annual vision screenings are recommended for early detection of glaucoma, cataracts, and diabetic retinopathy. These exams can also reveal signs of chronic conditions such as diabetes and high blood pressure.  Dental: Annual dental screenings help detect early signs of oral cancer, gum disease, and other conditions linked to overall health, including heart disease and diabetes.  Please see the attached documents for additional preventive care recommendations.

## 2024-01-21 ENCOUNTER — Encounter: Payer: Self-pay | Admitting: Family Medicine

## 2024-01-24 ENCOUNTER — Encounter: Payer: Self-pay | Admitting: Family Medicine

## 2024-01-24 ENCOUNTER — Ambulatory Visit: Admitting: Family Medicine

## 2024-01-24 VITALS — BP 140/80 | HR 79 | Temp 97.8°F | Resp 18 | Ht 65.25 in | Wt 188.6 lb

## 2024-01-24 DIAGNOSIS — G4700 Insomnia, unspecified: Secondary | ICD-10-CM | POA: Diagnosis not present

## 2024-01-24 DIAGNOSIS — K5901 Slow transit constipation: Secondary | ICD-10-CM

## 2024-01-24 DIAGNOSIS — R252 Cramp and spasm: Secondary | ICD-10-CM

## 2024-01-24 DIAGNOSIS — F410 Panic disorder [episodic paroxysmal anxiety] without agoraphobia: Secondary | ICD-10-CM

## 2024-01-24 LAB — COMPREHENSIVE METABOLIC PANEL WITH GFR
ALT: 12 U/L (ref 3–35)
AST: 13 U/L (ref 5–37)
Albumin: 4.3 g/dL (ref 3.5–5.2)
Alkaline Phosphatase: 63 U/L (ref 39–117)
BUN: 26 mg/dL — ABNORMAL HIGH (ref 6–23)
CO2: 30 meq/L (ref 19–32)
Calcium: 9.4 mg/dL (ref 8.4–10.5)
Chloride: 103 meq/L (ref 96–112)
Creatinine, Ser: 0.92 mg/dL (ref 0.40–1.20)
GFR: 63.4 mL/min
Glucose, Bld: 90 mg/dL (ref 70–99)
Potassium: 4.6 meq/L (ref 3.5–5.1)
Sodium: 140 meq/L (ref 135–145)
Total Bilirubin: 0.3 mg/dL (ref 0.2–1.2)
Total Protein: 7 g/dL (ref 6.0–8.3)

## 2024-01-24 LAB — CBC WITH DIFFERENTIAL/PLATELET
Basophils Absolute: 0 K/uL (ref 0.0–0.1)
Basophils Relative: 0.9 % (ref 0.0–3.0)
Eosinophils Absolute: 0.2 K/uL (ref 0.0–0.7)
Eosinophils Relative: 4.3 % (ref 0.0–5.0)
HCT: 40.1 % (ref 36.0–46.0)
Hemoglobin: 13.2 g/dL (ref 12.0–15.0)
Lymphocytes Relative: 39.2 % (ref 12.0–46.0)
Lymphs Abs: 1.7 K/uL (ref 0.7–4.0)
MCHC: 32.9 g/dL (ref 30.0–36.0)
MCV: 82 fl (ref 78.0–100.0)
Monocytes Absolute: 0.4 K/uL (ref 0.1–1.0)
Monocytes Relative: 9.5 % (ref 3.0–12.0)
Neutro Abs: 2 K/uL (ref 1.4–7.7)
Neutrophils Relative %: 46.1 % (ref 43.0–77.0)
Platelets: 257 K/uL (ref 150.0–400.0)
RBC: 4.89 Mil/uL (ref 3.87–5.11)
RDW: 16.8 % — ABNORMAL HIGH (ref 11.5–15.5)
WBC: 4.3 K/uL (ref 4.0–10.5)

## 2024-01-24 LAB — LIPID PANEL
Cholesterol: 191 mg/dL (ref 28–200)
HDL: 80.1 mg/dL
LDL Cholesterol: 90 mg/dL (ref 10–99)
NonHDL: 110.78
Total CHOL/HDL Ratio: 2
Triglycerides: 103 mg/dL (ref 10.0–149.0)
VLDL: 20.6 mg/dL (ref 0.0–40.0)

## 2024-01-24 LAB — VITAMIN D 25 HYDROXY (VIT D DEFICIENCY, FRACTURES): VITD: 20.53 ng/mL — ABNORMAL LOW (ref 30.00–100.00)

## 2024-01-24 LAB — MAGNESIUM: Magnesium: 2 mg/dL (ref 1.5–2.5)

## 2024-01-24 LAB — PHOSPHORUS: Phosphorus: 3.9 mg/dL (ref 2.3–4.6)

## 2024-01-24 LAB — TSH: TSH: 2.51 u[IU]/mL (ref 0.35–5.50)

## 2024-01-24 LAB — VITAMIN B12: Vitamin B-12: 236 pg/mL (ref 211–911)

## 2024-01-24 MED ORDER — LEMBOREXANT 5 MG PO TABS: 1.0000 | ORAL_TABLET | Freq: Every evening | ORAL | 1 refills | Status: AC | PRN

## 2024-01-24 MED ORDER — BELSOMRA 20 MG PO TABS: 20.0000 mg | ORAL_TABLET | Freq: Every evening | ORAL | 1 refills | Status: AC | PRN

## 2024-01-24 MED ORDER — DIAZEPAM 5 MG PO TABS: 5.0000 mg | ORAL_TABLET | Freq: Every evening | ORAL | 1 refills | Status: AC | PRN

## 2024-01-24 NOTE — Progress Notes (Signed)
 "                Subjective:    Patient ID: Theresa [name], female    DOB: February 01, 1955, 69 y.o.   MRN: [id number]  Chief Complaint  Patient presents with   Insomnia   Constipation    HPI Patient is in today for insomnia and constipation f/u.   Discussed the use of AI scribe software for clinical note transcription with the patient, who gave verbal consent to proceed.  History of Present Illness She experiences significant sleep disturbances, characterized by difficulty falling and staying asleep. She has been using diazepam  and trazodone  for sleep, but these medications result in morning drowsiness. She has not been taking Belsomra , as she does not recall receiving it since June 16th of the previous year.  She experiences occasional panic attacks, approximately five times in her life, which she describes as feeling like 'a heart attack' and 'chewing broken glass'. She uses diazepam  as needed for these episodes.  She has a history of fibromyalgia, which she attributes to past chemotherapy treatments. She inquires about immunotherapy for cancer patients, although she is not currently under oncological follow-up.  She describes experiencing leg pain at night, which she suspects might be sciatica or related to restless leg syndrome. The pain is described as similar to a 'Charlie horse'. She finds relief by applying alcohol to cool her legs. These symptoms are not consistent and do not occur every night.  She reports constipation and has been using Miralax, which when doubled, results in diarrhea.         Past Medical History:  Diagnosis Date   ADD (attention deficit disorder)    Anemia    Arthritis    neck   Breast cancer (HCC)    Left breast   Complication of anesthesia    1 time I felt everything - approx 2005, - or maybe 1986 or 2010- not sure   Constipation    probotic    Depression    Diverticulitis    Family history of adverse reaction to anesthesia     other  and PG- wake up during surgey   Fibromyalgia    GERD (gastroesophageal reflux disease)    occ   History of blood transfusion    History of stomach ulcers    Hypertension    white coat syndrome   Migraines    migraine since age 31    Pleurisy    Pneumonia    age 9   PONV (postoperative nausea and vomiting)    Urinary incontinence     Past Surgical History:  Procedure Laterality Date   ANTERIOR CERVICAL DECOMPRESSION/DISCECTOMY FUSION 4 LEVELS N/A 08/01/2018   Procedure: ANTERIOR CERVICAL DECOMPRESSION FUSION, CERVICAL FOUR TO FIVE, CERVICAL FIVE TO SIX , CERVICAL SIX TO SEVEN WITH INSTRUMENTATION AND ALLOGRAFT;  Surgeon: Beuford Anes, MD;  Location: [facility] OR;  Service: Orthopedics;  Laterality: N/A;   APPENDECTOMY  1960   BLEPHAROPLASTY Bilateral 12/25/2023   [facility] for eyelid drooping   BREAST BIOPSY Left 1985   BREAST RECONSTRUCTION  1986 and 2010   x's 2   CHOLECYSTECTOMY  1987   COLONOSCOPY     KNEE ARTHROSCOPY WITH MEDIAL MENISECTOMY Left 06/15/2022   Procedure: KNEE ARTHROSCOPY WITH PARTIAL MEDIAL AND PARTIAL LATERAL MENISECTOMY, CHONDROPLASTY MEDIAL AND LATERAL;  Surgeon: Yvone Rush, MD;  Location: [facility];  Service: Orthopedics;  Laterality: Left;   LASIK  2012   MASTECTOMY Left 1985  Family History  Problem Relation Age of Onset   Breast cancer Mother    Depression Mother    Heart failure Father    Depression Father    Arthritis Maternal Grandmother     Social History   Socioeconomic History   Marital status: Married    Spouse name: Not on file   Number of children: Not on file   Years of education: Not on file   Highest education level: Associate degree: occupational, scientist, product/process development, or vocational program  Occupational History   Occupation: Engineer, Site: [employer]  Tobacco Use   Smoking status: Never   Smokeless tobacco: Never  Vaping Use   Vaping status: Never Used  Substance and  Sexual Activity   Alcohol use: Yes    Comment: occasionally   Drug use: No   Sexual activity: Yes  Other Topics Concern   Not on file  Social History Narrative   Not on file   Social Drivers of Health   Tobacco Use: Low Risk (01/24/2024)   Patient History    Smoking Tobacco Use: Never    Smokeless Tobacco Use: Never    Passive Exposure: Not on file  Financial Resource Strain: Low Risk (01/13/2024)   Overall Financial Resource Strain (CARDIA)    Difficulty of Paying Living Expenses: Not very hard  Food Insecurity: Food Insecurity Present (01/13/2024)   Epic    Worried About Programme Researcher, Broadcasting/film/video in the Last Year: Sometimes true    Ran Out of Food in the Last Year: Sometimes true  Transportation Needs: No Transportation Needs (01/13/2024)   Epic    Lack of Transportation (Medical): No    Lack of Transportation (Non-Medical): No  Physical Activity: Insufficiently Active (01/13/2024)   Exercise Vital Sign    Days of Exercise per Week: 3 days    Minutes of Exercise per Session: 30 min  Stress: No Stress Concern Present (01/13/2024)   [employer] of Occupational Health - Occupational Stress Questionnaire    Feeling of Stress: Not at all  Social Connections: Socially Integrated (01/13/2024)   Social Connection and Isolation Panel    Frequency of Communication with Friends and Family: More than three times a week    Frequency of Social Gatherings with Friends and Family: More than three times a week    Attends Religious Services: More than 4 times per year    Active Member of Clubs or Organizations: Yes    Attends Banker Meetings: More than 4 times per year    Marital Status: Married  Catering Manager Violence: Not At Risk (01/15/2024)   Epic    Fear of Current or Ex-Partner: No    Emotionally Abused: No    Physically Abused: No    Sexually Abused: No  Depression (PHQ2-9): High Risk (01/24/2024)   Depression (PHQ2-9)    PHQ-2 Score: 11  Alcohol Screen: Low Risk  (01/13/2024)   Alcohol Screen    Last Alcohol Screening Score (AUDIT): 3  Housing: High Risk (01/13/2024)   Epic    Unable to Pay for Housing in the Last Year: Yes    Number of Times Moved in the Last Year: 1    Homeless in the Last Year: No  Utilities: Not At Risk (01/15/2024)   Epic    Threatened with loss of utilities: No  Health Literacy: Not on file    Outpatient Medications Prior to Visit  Medication Sig Dispense Refill   amphetamine -dextroamphetamine (ADDERALL XR) 20  MG 24 hr capsule 2 po every day 60 capsule 0   levocetirizine (XYZAL ) 5 MG tablet TAKE 1 TABLET EVERY DAY IN THE EVENING 90 tablet 3   pantoprazole  (PROTONIX ) 40 MG tablet Take 1 tablet (40 mg total) by mouth daily. 90 tablet 1   diazepam  (VALIUM ) 5 MG tablet Take 1 tablet (5 mg total) by mouth at bedtime as needed. 30 tablet 1   Suvorexant  (BELSOMRA ) 20 MG TABS Take 1 tablet (20 mg total) by mouth at bedtime as needed. 30 tablet 1   traZODone  (DESYREL ) 50 MG tablet TAKE 2 TABLETS EVERY DAY AT BEDTIME AS NEEDED 180 tablet 2   No facility-administered medications prior to visit.    Allergies[1]  Review of Systems  Constitutional:  Negative for fever and malaise/fatigue.  HENT:  Negative for congestion.   Eyes:  Negative for blurred vision.  Respiratory:  Negative for shortness of breath.   Cardiovascular:  Negative for chest pain, palpitations and leg swelling.  Gastrointestinal:  Positive for constipation. Negative for abdominal pain, blood in stool and nausea.  Genitourinary:  Negative for dysuria and frequency.  Musculoskeletal:  Negative for falls.  Skin:  Negative for rash.  Neurological:  Negative for dizziness, loss of consciousness and headaches.  Endo/Heme/Allergies:  Negative for environmental allergies.  Psychiatric/Behavioral:  Negative for depression. The patient is not nervous/anxious.        Objective:    Physical Exam Vitals and nursing note reviewed.  Constitutional:      General: She is  not in acute distress.    Appearance: Normal appearance. She is well-developed.  HENT:     Head: Normocephalic and atraumatic.  Eyes:     General: No scleral icterus.       Right eye: No discharge.        Left eye: No discharge.  Cardiovascular:     Rate and Rhythm: Normal rate and regular rhythm.     Heart sounds: No murmur heard. Pulmonary:     Effort: Pulmonary effort is normal. No respiratory distress.     Breath sounds: Normal breath sounds.  Musculoskeletal:        General: Normal range of motion.     Cervical back: Normal range of motion and neck supple.     Right lower leg: No edema.     Left lower leg: No edema.  Skin:    General: Skin is warm and dry.  Neurological:     Mental Status: She is alert and oriented to person, place, and time.  Psychiatric:        Mood and Affect: Mood normal.        Behavior: Behavior normal.        Thought Content: Thought content normal.        Judgment: Judgment normal.     BP (!) 140/80 (BP Location: Left Arm, Patient Position: Sitting, Cuff Size: Large)   Pulse 79   Temp 97.8 F (36.6 C) (Oral)   Resp 18   Ht 5' 5.25 (1.657 m)   Wt 188 lb 9.6 oz (85.5 kg)   SpO2 98%   BMI 31.14 kg/m  Wt Readings from Last 3 Encounters:  01/24/24 188 lb 9.6 oz (85.5 kg)  01/15/24 190 lb 9.6 oz (86.5 kg)  05/23/23 195 lb 5.2 oz (88.6 kg)    Diabetic Foot Exam - Simple   No data filed    Lab Results  Component Value Date   WBC 5.0 05/23/2023   HGB  11.6 (L) 05/23/2023   HCT 36.9 05/23/2023   PLT 249 05/23/2023   GLUCOSE 114 (H) 05/23/2023   CHOL 190 07/31/2022   TRIG 137.0 07/31/2022   HDL 72.10 07/31/2022   LDLCALC 91 07/31/2022   ALT 19 05/23/2023   AST 20 05/23/2023   NA 139 05/23/2023   K 3.6 05/23/2023   CL 103 05/23/2023   CREATININE 0.95 05/23/2023   BUN 22 05/23/2023   CO2 26 05/23/2023   TSH 2.32 07/31/2022   INR 0.9 07/29/2018    Lab Results  Component Value Date   TSH 2.32 07/31/2022   Lab Results   Component Value Date   WBC 5.0 05/23/2023   HGB 11.6 (L) 05/23/2023   HCT 36.9 05/23/2023   MCV 84.4 05/23/2023   PLT 249 05/23/2023   Lab Results  Component Value Date   NA 139 05/23/2023   K 3.6 05/23/2023   CO2 26 05/23/2023   GLUCOSE 114 (H) 05/23/2023   BUN 22 05/23/2023   CREATININE 0.95 05/23/2023   BILITOT 0.4 05/23/2023   ALKPHOS 58 05/23/2023   AST 20 05/23/2023   ALT 19 05/23/2023   PROT 6.9 05/23/2023   ALBUMIN 3.5 05/23/2023   CALCIUM 9.2 05/23/2023   ANIONGAP 10 05/23/2023   EGFR 79.0 08/23/2023   GFR 66.66 07/31/2022   Lab Results  Component Value Date   CHOL 190 07/31/2022   Lab Results  Component Value Date   HDL 72.10 07/31/2022   Lab Results  Component Value Date   LDLCALC 91 07/31/2022   Lab Results  Component Value Date   TRIG 137.0 07/31/2022   Lab Results  Component Value Date   CHOLHDL 3 07/31/2022   No results found for: HGBA1C     Assessment & Plan:  Insomnia, unspecified type -     Lemborexant ; Take 1 tablet (5 mg total) by mouth at bedtime as needed.  Dispense: 30 tablet; Refill: 1  Panic attack Assessment & Plan: As needed valium -- uses rarely but her brother and mother passed away recently  Orders: -     diazePAM ; Take 1 tablet (5 mg total) by mouth at bedtime as needed.  Dispense: 30 tablet; Refill: 1  Leg cramps -     CBC with Differential/Platelet -     Comprehensive metabolic panel with GFR -     Lipid panel -     TSH -     Vitamin B12 -     VITAMIN D  25 Hydroxy (Vit-D Deficiency, Fractures) -     Magnesium -     Phosphorus  Slow transit constipation Assessment & Plan: Senokot / ducolax Drink plenty of water   Prunes/ prune juice--- inc fiber in diet  F/u as needed    Assessment and Plan Assessment & Plan Insomnia   Chronic insomnia with difficulty falling and staying asleep. Previously prescribed Belsomra , but not taken recently. Currently using trazodone  and diazepam , but diazepam  is not  recommended for concurrent use with trazodone  due to increased drowsiness. Discussed alternative medication, Lemborexant  (Dayvigo ), which may be less drowsy than Belsomra . Discontinued trazodone . Prescribed Lemborexant  (Dayvigo ) for insomnia. Prescribed diazepam  as needed for panic attacks.  Constipation   Chronic constipation with inadequate response to Miralax, leading to diarrhea when doubled. Discussed dietary modifications and alternative laxatives. Emphasized importance of adequate hydration. Increase water  intake. Try dietary modifications with prunes or prune juice. Consider Senokot or Dulcolax as alternative laxatives.  Leg pain and cramps   Intermittent leg pain and cramps,  possibly related to sciatica or restless leg syndrome. Symptoms are not constant, suggesting possible electrolyte imbalance or mattress-related issues. Checked electrolytes to rule out deficiencies. Consider dietary modifications such as tonic water , mustard, or pickle juice. Evaluate mattress firmness and consider replacement if necessary.  Panic disorder   Infrequent panic attacks, effectively managed with diazepam  as needed. She reports only five panic attacks, each feeling severe but manageable with diazepam . Prescribed diazepam  as needed for panic attacks.  General Health Maintenance   Discussed potential benefits of immunotherapy for cancer patients, though she does not currently follow up with an oncologist. No current oncology follow-up or immunotherapy treatment. Consider follow-up with oncologist if interested in immunotherapyAssessment & Plan Insomnia   Chronic insomnia with difficulty falling and staying asleep. Previously prescribed Belsomra , but not taken recently. Currently using trazodone  and diazepam , but diazepam  is not recommended for concurrent use with trazodone  due to increased drowsiness. Discussed alternative medication, Lemborexant  (Dayvigo ), which may be less drowsy than Belsomra . Discontinued  trazodone . Prescribed Lemborexant  (Dayvigo ) for insomnia. Prescribed diazepam  as needed for panic attacks.  Constipation   Chronic constipation with inadequate response to Miralax, leading to diarrhea when doubled. Discussed dietary modifications and alternative laxatives. Emphasized importance of adequate hydration. Increase water  intake. Try dietary modifications with prunes or prune juice. Consider Senokot or Dulcolax as alternative laxatives.  Leg pain and cramps   Intermittent leg pain and cramps, possibly related to sciatica or restless leg syndrome. Symptoms are not constant, suggesting possible electrolyte imbalance or mattress-related issues. Checked electrolytes to rule out deficiencies. Consider dietary modifications such as tonic water , mustard, or pickle juice. Evaluate mattress firmness and consider replacement if necessary.  Panic disorder   Infrequent panic attacks, effectively managed with diazepam  as needed. She reports only five panic attacks, each feeling severe but manageable with diazepam . Prescribed diazepam  as needed for panic attacks.  General Health Maintenance   Discussed potential benefits of immunotherapy for cancer patients, though she does not currently follow up with an oncologist. No current oncology follow-up or immunotherapy treatment. Consider follow-up with oncologist if interested in im.  Assessment and Plan Assessment & Plan    Jamee JONELLE Antonio Cyndee, DO     [1]  Allergies Allergen Reactions   Nitroglycerin     Migraine   Hydrocodone -Acetaminophen  Itching   Oxycodone  Itching   "

## 2024-01-24 NOTE — Patient Instructions (Signed)

## 2024-01-24 NOTE — Assessment & Plan Note (Signed)
 As needed valium -- uses rarely but her brother and mother passed away recently

## 2024-01-24 NOTE — Addendum Note (Signed)
 Addended by: ANTONIO CYNDEE ROCKERS R on: 01/24/2024 11:55 AM   Modules accepted: Orders

## 2024-01-24 NOTE — Assessment & Plan Note (Signed)
 Senokot / ducolax Drink plenty of water   Prunes/ prune juice--- inc fiber in diet  F/u as needed

## 2024-01-28 ENCOUNTER — Ambulatory Visit: Payer: Self-pay | Admitting: Family Medicine

## 2024-02-11 ENCOUNTER — Ambulatory Visit

## 2024-02-12 ENCOUNTER — Ambulatory Visit

## 2024-02-12 DIAGNOSIS — E538 Deficiency of other specified B group vitamins: Secondary | ICD-10-CM | POA: Diagnosis not present

## 2024-02-12 MED ORDER — CYANOCOBALAMIN 1000 MCG/ML IJ SOLN
1000.0000 ug | Freq: Once | INTRAMUSCULAR | Status: AC
  Administered 2024-02-12: 1000 ug via INTRAMUSCULAR

## 2024-02-12 NOTE — Progress Notes (Signed)
 Pt here for monthly B12 injection per [facility]  Last B12 injection: First one today  Last B12 level:  01/24/2024  B12 1000mcg given IM, and pt tolerated injection well.  Next B12 injection scheduled for: 03/11/2024

## 2024-02-14 ENCOUNTER — Encounter: Payer: Self-pay | Admitting: Family Medicine

## 2024-02-14 DIAGNOSIS — F988 Other specified behavioral and emotional disorders with onset usually occurring in childhood and adolescence: Secondary | ICD-10-CM

## 2024-02-14 NOTE — Telephone Encounter (Signed)
 Copied from CRM #[id number]. Topic: Clinical - Medication Refill >> Feb 14, 2024  7:50 AM Theresa [name] wrote: Medication: amphetamine -dextroamphetamine (ADDERALL XR) 20 MG 24 hr capsule  Has the patient contacted their pharmacy? Yes (Agent: If no, request that the patient contact the pharmacy for the refill. If patient does not wish to contact the pharmacy document the reason why and proceed with request.) (Agent: If yes, when and what did the pharmacy advise?)  This is the patient's preferred pharmacy:  [facility] [place], [place] - [address] [place] [address] [place] [address] Phone: [phone] Fax: [phone]  Is this the correct pharmacy for this prescription? Yes If no, delete pharmacy and type the correct one.   Has the prescription been filled recently? Yes  Is the patient out of the medication? Yes  Has the patient been seen for an appointment in the last year OR does the patient have an upcoming appointment? Yes  Can we respond through MyChart? No  Agent: Please be advised that Rx refills may take up to 3 business days. We ask that you follow-up with your pharmacy.

## 2024-02-14 NOTE — Telephone Encounter (Signed)
 Requesting: Adderall XR 20mg   Contract: 10/23/19 UDS: 03/26/20 Last Visit: 01/24/24  Next Visit: None Last Refill: 01/14/24 #60 and 0RF   Please Advise

## 2024-02-15 ENCOUNTER — Other Ambulatory Visit: Payer: Self-pay | Admitting: Family

## 2024-02-15 DIAGNOSIS — F988 Other specified behavioral and emotional disorders with onset usually occurring in childhood and adolescence: Secondary | ICD-10-CM

## 2024-02-15 MED ORDER — AMPHETAMINE-DEXTROAMPHET ER 20 MG PO CP24: ORAL_CAPSULE | ORAL | 0 refills | Status: AC

## 2024-02-15 NOTE — Telephone Encounter (Signed)
 Copied from CRM #[id number]. Topic: Clinical - Medication Question >> Feb 15, 2024 12:45 PM Deaijah H wrote: Reason for CRM: Patient called in requesting to speak with office manager regarding amphetamine -dextroamphetamine (ADDERALL XR) 20 MG 24 hr capsule refill. Was told Dr. Antonio is not in and then asked if refill will be rushed cause she will be out after today. Would like to be contacted asap

## 2024-03-11 ENCOUNTER — Ambulatory Visit

## 2025-01-15 ENCOUNTER — Ambulatory Visit
